# Patient Record
Sex: Male | Born: 1970 | Race: White | Hispanic: No | Marital: Married | Smoking: Never smoker
Health system: Southern US, Community
[De-identification: ages and names within clinical notes are randomized; demographics above are authoritative.]

## PROBLEM LIST (undated history)

## (undated) DIAGNOSIS — Z9289 Personal history of other medical treatment: Secondary | ICD-10-CM

## (undated) DIAGNOSIS — I471 Supraventricular tachycardia, unspecified: Secondary | ICD-10-CM

## (undated) DIAGNOSIS — K219 Gastro-esophageal reflux disease without esophagitis: Secondary | ICD-10-CM

## (undated) DIAGNOSIS — L709 Acne, unspecified: Secondary | ICD-10-CM

## (undated) DIAGNOSIS — J302 Other seasonal allergic rhinitis: Secondary | ICD-10-CM

## (undated) DIAGNOSIS — F419 Anxiety disorder, unspecified: Secondary | ICD-10-CM

## (undated) DIAGNOSIS — N200 Calculus of kidney: Secondary | ICD-10-CM

## (undated) DIAGNOSIS — K589 Irritable bowel syndrome without diarrhea: Secondary | ICD-10-CM

## (undated) HISTORY — DX: Anxiety disorder, unspecified: F41.9

## (undated) HISTORY — DX: Acne, unspecified: L70.9

## (undated) HISTORY — DX: Personal history of other medical treatment: Z92.89

## (undated) HISTORY — DX: Calculus of kidney: N20.0

## (undated) HISTORY — DX: Supraventricular tachycardia, unspecified: I47.10

## (undated) HISTORY — PX: MOUTH SURGERY: SHX715

## (undated) HISTORY — DX: Irritable bowel syndrome, unspecified: K58.9

## (undated) HISTORY — DX: Supraventricular tachycardia: I47.1

## (undated) HISTORY — DX: Other seasonal allergic rhinitis: J30.2

## (undated) HISTORY — PX: TOENAIL EXCISION: SUR558

## (undated) HISTORY — DX: Gastro-esophageal reflux disease without esophagitis: K21.9

---

## 1999-07-05 ENCOUNTER — Encounter: Payer: Self-pay | Admitting: Emergency Medicine

## 1999-07-05 ENCOUNTER — Emergency Department (HOSPITAL_COMMUNITY): Admission: EM | Admit: 1999-07-05 | Discharge: 1999-07-05 | Payer: Self-pay | Admitting: Emergency Medicine

## 2007-06-01 ENCOUNTER — Ambulatory Visit: Payer: Self-pay | Admitting: Family Medicine

## 2007-06-04 ENCOUNTER — Observation Stay (HOSPITAL_COMMUNITY): Admission: EM | Admit: 2007-06-04 | Discharge: 2007-06-05 | Payer: Self-pay | Admitting: Emergency Medicine

## 2007-08-24 ENCOUNTER — Ambulatory Visit: Payer: Self-pay | Admitting: Internal Medicine

## 2007-11-10 ENCOUNTER — Ambulatory Visit: Payer: Self-pay | Admitting: Family Medicine

## 2007-12-20 ENCOUNTER — Ambulatory Visit: Payer: Self-pay | Admitting: Family Medicine

## 2008-07-25 ENCOUNTER — Ambulatory Visit: Payer: Self-pay | Admitting: Family Medicine

## 2008-08-09 ENCOUNTER — Ambulatory Visit: Payer: Self-pay | Admitting: Internal Medicine

## 2008-09-05 DIAGNOSIS — K219 Gastro-esophageal reflux disease without esophagitis: Secondary | ICD-10-CM | POA: Insufficient documentation

## 2008-09-05 DIAGNOSIS — F411 Generalized anxiety disorder: Secondary | ICD-10-CM | POA: Insufficient documentation

## 2008-09-05 DIAGNOSIS — I471 Supraventricular tachycardia: Secondary | ICD-10-CM | POA: Insufficient documentation

## 2008-12-24 IMAGING — CR DG CHEST 2V
2 series · 2 of 2 positions shown · non-contrast
Comparison: none

CLINICAL DATA: Tachycardia.
 LDHK1-5 VIEWS:

[w chest pa]
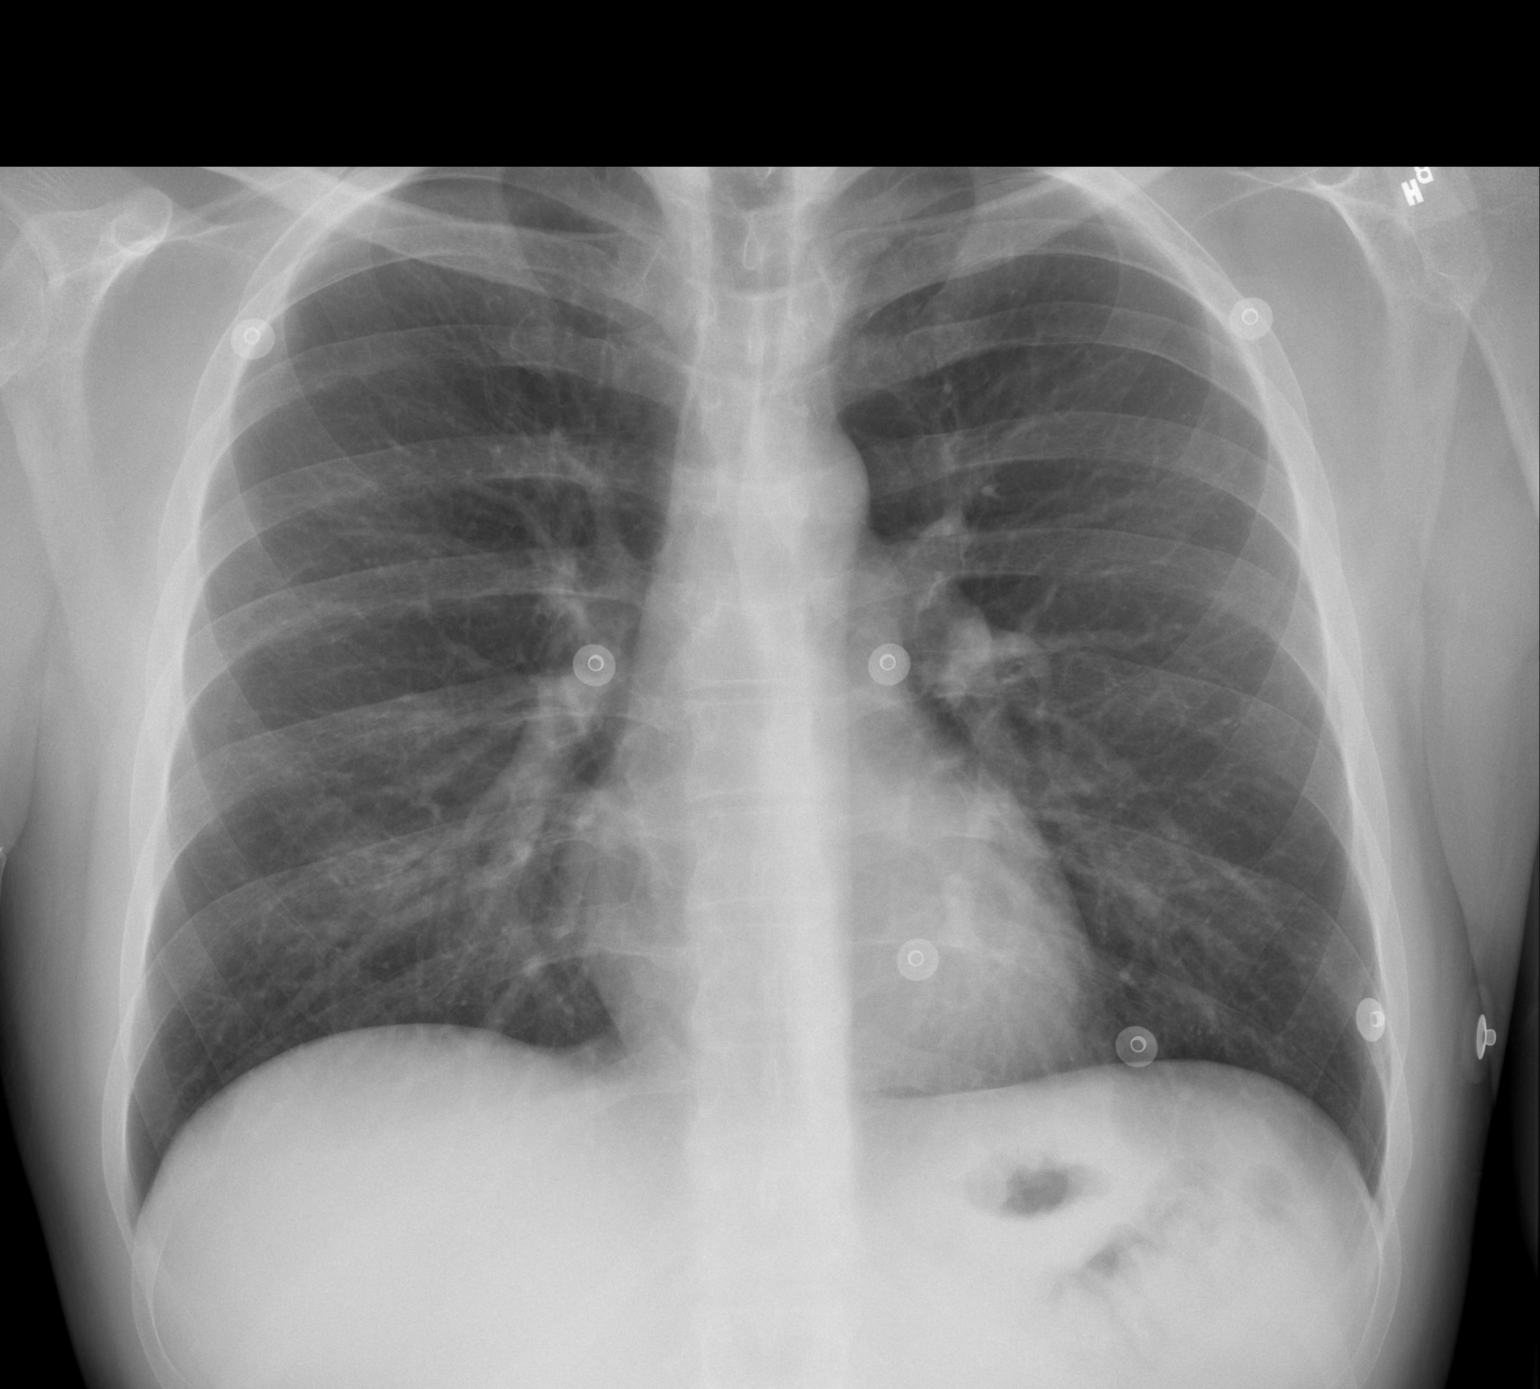

[w chest lat]
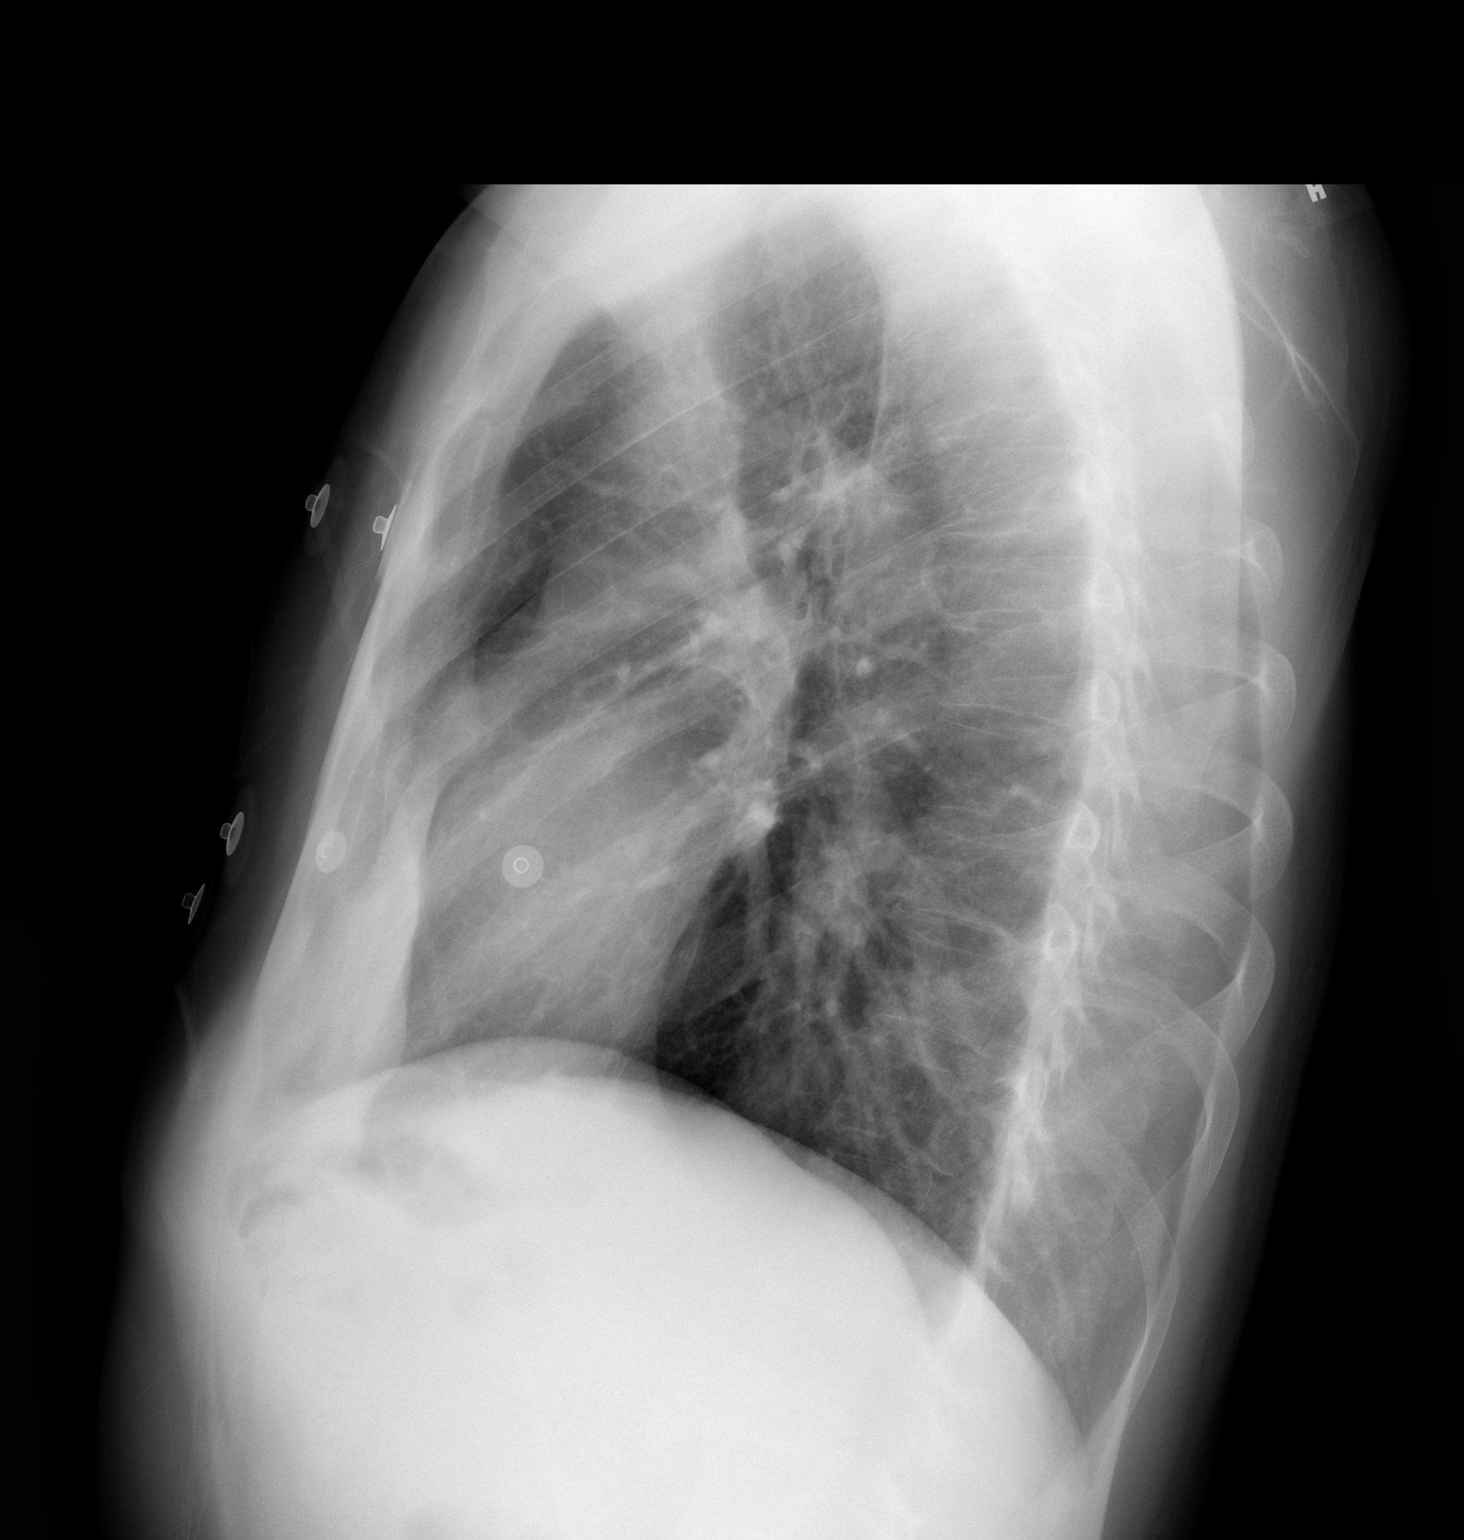

[2 of 2 positions shown; findings below may reference images not displayed]

FINDINGS: The cardiomediastinal contours are normal.  The lungs are clear.  There is no pleural effusion or pneumothorax.  No acute osseous findings are seen.
IMPRESSION: No active cardiopulmonary process.

## 2009-11-08 ENCOUNTER — Ambulatory Visit: Payer: Self-pay | Admitting: Internal Medicine

## 2009-11-08 DIAGNOSIS — R030 Elevated blood-pressure reading, without diagnosis of hypertension: Secondary | ICD-10-CM | POA: Insufficient documentation

## 2010-08-26 NOTE — Assessment & Plan Note (Signed)
Summary: f1y/per pt call/lg  Medications Added MULTIVITAMINS  TABS (MULTIPLE VITAMIN) once daily OMEGA-3 1000 MG CAPS (OMEGA-3 FATTY ACIDS) one capsule daily ZANTAC 150 MAXIMUM STRENGTH 150 MG TABS (RANITIDINE HCL) 1/2 tab as needed      Allergies Added: NKDA  History of Present Illness: Mr. Jordan Stafford is seen in followup for SVT.  He elected to undertake medical therapy and he has been on metoprolol tartrate 25 mg twice daily for long time without clinical recurrences.  Current Medications (verified): 1)  Metoprolol Tartrate 25 Mg Tabs (Metoprolol Tartrate) .... One Tablet By Mouth Two Times A Day 2)  Multivitamins  Tabs (Multiple Vitamin) .... Once Daily 3)  Omega-3 1000 Mg Caps (Omega-3 Fatty Acids) .... One Capsule Daily 4)  Zantac 150 Maximum Strength 150 Mg Tabs (Ranitidine Hcl) .... 1/2 Tab As Needed  Allergies (verified): No Known Drug Allergies  Past History:  Past Medical History: Last updated: 09/05/2008 supra-ventricular tachycardia Anxiety GERD Irritable bowel syndrome Allergies  Past Surgical History: Last updated: 09/05/2008 oral surgery x3  Family History: Last updated: 09/05/2008 Negative FH of Diabetes, Hypertension, or Coronary Artery Disease  Social History: Last updated: 09/05/2008 Single  Tobacco Use - No.  Alcohol Use - no  Vital Signs:  Patient profile:   40 year old male Height:      68.5 inches Weight:      162 pounds BMI:     24.36 Pulse rate:   71 / minute Pulse rhythm:   regular BP sitting:   130 / 90  (left arm) Cuff size:   regular  Vitals Entered By: Judithe Modest CMA (November 08, 2009 11:12 AM)  Physical Exam  General:  The patient was alert and oriented in no acute distress. HEENT Normal.  Neck veins were flat, carotids were brisk.  Lungs were clear.  Heart sounds were regular without murmurs or gallops.  Abdomen was soft with active bowel sounds. There is no clubbing cyanosis or edema. Skin Warm and  dry    Impression & Recommendations:  Problem # 1:  SVT/ PSVT/ PAT (ICD-427.0) no recurrences His updated medication list for this problem includes:    Metoprolol Tartrate 25 Mg Tabs (Metoprolol tartrate) ..... One tablet by mouth two times a day  Orders: EKG w/ Interpretation (93000)  Problem # 2:  ELEVATED BP READING WITHOUT DX HYPERTENSION (ICD-796.2) He will track his blood pressure over time to  Patient Instructions: 1)  Your physician wants you to follow-up in:  12 months with Dr Graciela Husbands. You will receive a reminder letter in the mail two months in advance. If you don't receive a letter, please call our office to schedule the follow-up appointment. Prescriptions: METOPROLOL TARTRATE 25 MG TABS (METOPROLOL TARTRATE) one tablet by mouth two times a day  #60 x 11   Entered by:   Optometrist BSN   Authorized by:   Nathen May, MD, [facility]   Signed by:   Gypsy Balsam RN BSN on 11/08/2009   Method used:   Tax adviser to        [employer] (retail)       [phone]       [place], [place]  [address]       Ph: [phone]       Fax: [phone]   RxID:   [id number]

## 2010-11-07 ENCOUNTER — Telehealth: Payer: Self-pay | Admitting: Internal Medicine

## 2010-11-07 DIAGNOSIS — I471 Supraventricular tachycardia: Secondary | ICD-10-CM

## 2010-11-07 MED ORDER — METOPROLOL TARTRATE 25 MG PO TABS
25.0000 mg | ORAL_TABLET | Freq: Two times a day (BID) | ORAL | Status: DC
Start: 2010-11-07 — End: 2010-12-08

## 2010-11-07 NOTE — Telephone Encounter (Signed)
LMTCB ./CY 

## 2010-11-07 NOTE — Telephone Encounter (Signed)
Pt has question re meds and Pt [phone].

## 2010-11-07 NOTE — Telephone Encounter (Addendum)
SPOKE WITH PT HAD TAKEN LOPRESSOR LAST NIGHT AROUND 10:00 PM  WHICH IS LATER THAN NORMAL  .GOT UP THIS AM AND TOOK  LOPRESSOR AT 7:00 AM  C/O FEELING A LITTLE LIGHT HEADED  FEELS BETTER AT THIS TIME INSTRUCTED TO MONITOR B/P   AND  MAY TAKE SECOND DOSE AT 700 PM THIS EVENING IF FEELS OKAY.VERBALIZED UNDERSTANDING./CY

## 2010-12-04 ENCOUNTER — Encounter: Payer: Self-pay | Admitting: Internal Medicine

## 2010-12-08 ENCOUNTER — Encounter: Payer: Self-pay | Admitting: Internal Medicine

## 2010-12-08 ENCOUNTER — Ambulatory Visit (INDEPENDENT_AMBULATORY_CARE_PROVIDER_SITE_OTHER): Payer: BC Managed Care – PPO | Admitting: Internal Medicine

## 2010-12-08 VITALS — BP 127/84 | HR 81 | Ht 68.0 in | Wt <= 1120 oz

## 2010-12-08 DIAGNOSIS — I471 Supraventricular tachycardia: Secondary | ICD-10-CM

## 2010-12-08 MED ORDER — DILTIAZEM HCL 60 MG PO TABS: 60.0000 mg | ORAL_TABLET | Freq: Two times a day (BID) | ORAL | Status: DC

## 2010-12-08 NOTE — Progress Notes (Signed)
  HPI  Jordan Stafford is a 40 y.o. male   Past Medical History  Diagnosis Date  . Tachycardia     supra-ventricular  . Anxiety   . GERD (gastroesophageal reflux disease)   . IBS (irritable bowel syndrome)   . Seasonal allergies     Past Surgical History  Procedure Date  . Mouth surgery     x3    Current Outpatient Prescriptions  Medication Sig Dispense Refill  . ibuprofen (ADVIL,MOTRIN) 200 MG tablet Take 200 mg by mouth every 6 (six) hours as needed.        . metoprolol tartrate (LOPRESSOR) 25 MG tablet Take 1 tablet (25 mg total) by mouth 2 (two) times daily.  60 tablet  11  . Multiple Vitamin (MULTIVITAMINS PO) Take by mouth daily.        . OMEGA 3 1000 MG CAPS Take by mouth daily.        . ranitidine (ZANTAC 150 MAXIMUM STRENGTH) 150 MG tablet 1/2 tab as needed         No Known Allergies  Review of Systems negative except from HPI and PMH  Physical Exam Well developed and well nourished in no acute distress HENT normal E scleral and icterus clear Neck Supple JVP flat; carotids brisk and full Clear to ausculation Regular rate and rhythm, no murmurs gallops or rub Soft with active bowel sounds No clubbing cyanosis and edema Alert and oriented, grossly normal motor and sensory function Skin Warm and Dry  ECG sinus rhythm at 74 Intervals 0.16/2009/0.36 Axis LXXXII  Assessment and  Plan

## 2010-12-08 NOTE — Patient Instructions (Addendum)
Your physician has recommended you make the following change in your medication: 1) Stop metoprolol. 2) Take Diltiazem 60mg  one tablet twice daily.  Your physician wants you to follow-up in: 1 year. You will receive a reminder letter in the mail two months in advance. If you don't receive a letter, please call our office to schedule the follow-up appointment.

## 2010-12-08 NOTE — Assessment & Plan Note (Addendum)
The patient has had no recurrences of tachycardia. He is exercising 6 days a week. He is concerned about weight gain. His max heart rate or heart rate shortly after he is off the treadmill his only 105. We have discussed alternative therapies including stopping beta blockers or trying a calcium blocker. We will try diltiazem 60 mg twice daily. We'll see how he does.   He is further concerned about the epinephrine be withheld from his nose he forgot work. I assured him that it does not augment the anesthetic effect to the best of my knowledge.

## 2010-12-09 NOTE — Discharge Summary (Signed)
NAME:  Jordan Stafford, Jordan Stafford NO.:  [id number]   MEDICAL RECORD NO.:  [id number]          PATIENT TYPE:  OBV   LOCATION:  3731                         FACILITY:  [facility]   PHYSICIAN:  Dani Gobble, MD       DATE OF BIRTH:  10/31/1970   DATE OF ADMISSION:  06/03/2007  DATE OF DISCHARGE:  06/04/2006                               DISCHARGE SUMMARY   ADDENDUM:   DISCHARGE DIAGNOSES:  Please note hypokalemia, again replaced.   PLAN:  The patient was to be discharged June 07, 2007, but developed  the feeling in his stomach as if he was going to go back into the  tachycardia.  We kept him overnight just for observation.  All of his  new studies were negative.  Amylase and lipase were negative.  On the  morning prior to discharge, LDL 97, HDL 34, triglycerides 188 and  cholesterol 169.  His potassium was low at 3.3 and we gave him 40 mEq of  potassium.  Dr. Allyson Sabal does want him in addition to a 2D echocardiogram  to get a stress Myoview in the office tomorrow morning so he knows he  knows to be n.p.o. after midnight and not to take his Lopressor until  after the stress test.  He has not had any today, June 05, 2007.   He will then followup with Dr. Clarene Duke as previously instructed after the  2D echocardiogram is done as well. He did well last night.  Dr. Allyson Sabal  saw him on the day of discharge.  The plan will be depending on these  test results, etc., he may need EP evaluation.      Darcella Gasman. Annie Paras, N.P.    ______________________________  Dani Gobble, MD    LRI/MEDQ  D:  06/05/2007  T:  06/05/2007  Job:  [id number]

## 2010-12-09 NOTE — Assessment & Plan Note (Signed)
[facility]                         ELECTROPHYSIOLOGY OFFICE NOTE   Glenden, Rossell SULIMAN TERMINI                        MRN:          [id number]  DATE:08/09/2008                            DOB:          11-30-1970    Mr. Jordan Stafford is seen in followup for SVT.  He elected to undertake medical  therapy and he has been on metoprolol tartrate 25 mg twice daily for  long time without clinical recurrences.   He comes in describing an episode of a fullness in his stomach that is  associated with transient lightheadedness.  His girlfriend who is a  nurse is taking his pulse through this and his pulse has been normal.  These episodes last up to about 60 seconds.  He gets warm and sweaty  with these.   PHYSICAL EXAMINATION:  VITAL SIGNS:  His blood pressure is 120/80, his  pulse was 81.  LUNGS:  Clear.  HEART:  Sounds were regular.  ABDOMEN:  Soft.  EXTREMITIES:  Without edema.   Electrocardiogram dated today demonstrated sinus rhythm at 81 with  intervals of 0.15/0.10/0.36, the axis was 65 degrees.   IMPRESSION:  1. Supraventricular tachycardia, on metoprolol tartrate.  2. Episode of abdominal fullness, question cause without evidence of a      primary arrhythmia.   I have asked him to follow up with Dr. Susann Givens about this, we will plan  to see him again in one year's time.     Jordan Salvia, MD, [facility]  Electronically Signed    SCK/MedQ  DD: 08/09/2008  DT: 08/10/2008  Job #: [id number]   cc:   Jordan Stafford, M.D.  Jordan Stafford, M.D.

## 2010-12-09 NOTE — Letter (Signed)
August 24, 2007    Jordan Stafford, M.D.  [address]  [address]  [place], [place] [address]   RE:  Jordan Stafford, Jordan Stafford  MRN:  [id number]  /  DOB:  06-Aug-1970   Dear Jordan Stafford:   It was a pleasure to see to the pleasure to see Jordan Stafford at your  request because of supraventricular tachycardia.   You saw him apparently in 2004 where he had an episode of feeling like  his heart block flopped down into his stomach and back up into his neck.  There was some post event hypotension.  There was interestingly no  tachycardia with this and he recalls that when he took his blood  pressure at home, he did not recall a tachycardia.   He has had episodes of this 3-4 times a year, each of which has been  very brief until the episode in November when he went to the hospital  with a documented tachycardia.  He had measured his blood pressure  again, it was low, his heart rate was recorded at 190 and he tried to  get himself the hospital.  These episodes were diuretic negative, fraud  negative. There was no shortness breath or chest discomfort. There was a  Stafford bit of lightheadedness with it.   He was given adenosine with termination.   CARDIAC EVALUATION:  I do not recall that I saw an echo report.   SOCIAL HISTORY:  He is single.  He lives at home with his dog.  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   His outpatient medications include only Prilosec and metoprolol 25  b.i.d.   He has no known drug allergies.   REVIEW OF SYSTEMS:  In addition to the above was notable for allergies,  fatigue, IBS, GE reflux as well as anxiety.   PHYSICAL EXAMINATION:  He is a young Caucasian male appearing his stated  age of 2. I will get what was recorded later. His heart rate was as 75.  History blood pressure was 133/86, his pulse was 81.  HEENT:  Demonstrated no icterus or xanthoma.  NECK:  Veins were flat. The carotids were brisk and full bilaterally  without bruits.  BACK:  Without kyphosis or scoliosis.  LUNGS:   Clear.  HEART:  Sounds were regular without murmurs or gallops.  ABDOMEN:  Soft with active bowel sounds and without midline pulsation or  hepatomegaly.  Femoral pulses were 2+, distal pulses were intact.  There  was no clubbing, cyanosis or edema.  NEUROLOGIC:  Grossly normal.  SKIN:  Warm and dry.   PAST SURGICAL HISTORY:  Notable for oral surgery.   There is documentation of a tachycardia dated June 03, 2007. There  was a narrow QRS tachycardia with a cycle length of approximately 300  milliseconds.  I could not see a P-wave as it is a very bad copy from  EMS.   A 12-lead dated the same day demonstrated sinus rhythm at 100 with  intervals of 0.15/0.10/0.33. The axis was 80 degrees.  There was minor T-  wave flattening.   IMPRESSION:  1. Supraventricular tachycardia.  2. Anxiety.   Mr. Jordan Stafford has super SVT.  He was not interested after a lengthy  discussion with him and his father to pursue catheter ablation.  He  would like to continue on his medications currently.   We discussed the potential benefits as well as the potential risks. We  discussed the potential issues related to the location  of the pathway  vis-a-vis the AV node and the risk of heart block.  The father expressed  his frustrations about the surgery with his mother when he told the  surgeon's not to take veins from one of the patient's leg and they did  so anyway.   I will see the patient again in 6 months' time.   Thank you for the consultation.    Sincerely,      Jordan Salvia, MD, [facility]  Electronically Signed    SCK/MedQ  DD: 08/24/2007  DT: 08/25/2007  Job #: [id number]   CC:   Jordan Stafford, M.D.  Jordan Stafford, M.D.

## 2010-12-09 NOTE — Discharge Summary (Signed)
NAME:  Jordan Stafford, Jordan Stafford NO.:  [id number]   MEDICAL RECORD NO.:  [id number]          PATIENT TYPE:  OBV   LOCATION:  3731                         FACILITY:  [facility]   PHYSICIAN:  Dani Gobble, MD       DATE OF BIRTH:  1970/12/20   DATE OF ADMISSION:  06/03/2007  DATE OF DISCHARGE:  06/04/2007                               DISCHARGE SUMMARY   This is a 23-hour observation.   DISCHARGE DIAGNOSES:  1. Paroxysmal supraventricular tachycardia syndrome resolved.  2. Anxiety.  3. Gastroesophageal reflux disease.   DISCHARGE CONDITION:  Improved.   DISCHARGE MEDICATIONS:  1. Prilosec 20 mg daily.  2. Doxycycline daily as before.  3. Lopressor 50 mg a half a tablet twice a day which was a new      medication.   DISCHARGE INSTRUCTIONS:  1. Activity without restrictions, low-sodium heart healthy diet.  2. Call if any problems develop.  3. Follow up with Dr. Clarene Duke.  Office will call with date and time.   HISTORY OF PRESENT ILLNESS:  A 41 year old white male with a history of  gastroesophageal reflux disease presenting to [facility] by EMS with SVT.  The  symptoms began approximately 3 years ago with sensation of fullness in  epigastric region radiating into his chest and neck.  Sudden onset,  usually occurs with exercise and during or just after meals.  He  attributed his symptoms to reflux and using p.r.n. Zantac.  Episodes  were short-lived and infrequent, but more episodes are occurring now  more frequently and increasingly severe.  He came in actually to the ER  June 03, 2007, and he had sat up from his computer and experienced  the epigastric discomfort and noted a feeling of nervousness.  He had  mild lightheadedness, no chest pain, heart rate was up, and he was  unable to get a deep breath.  EMS was consulted and found to have  patient to be in a near complex tachycardia.  Heart rate was 228.  He  was given 6 mg of IV adenosine and restored to sinus rhythm.   He  was admitted for observation.   PAST MEDICAL HISTORY:  Reflux disease.   FAMILY HISTORY:  Maternal grandmother had coronary disease.   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   ALLERGIES:  NO KNOWN DRUG ALLERGIES.   OUTPATIENT MEDICATIONS:  Prilosec 2 daily.   REVIEW OF SYSTEMS:  See H&P.   PHYSICAL EXAM AT DISCHARGE:  VITAL SIGNS:  Blood pressure 116/77, pulse  86, respiratory rate is 18, temperature 98.5, oxygen saturation 97% room  air.  HEART:  Regular rate and rhythm, slightly tachy.  LUNGS:  Clear.   LABORATORY DATA:  Hemoglobin 15, hematocrit 42, platelets 236, WBC 8.4.  Sodium 137, potassium 3.5, BUN 10, creatinine 0.88, glucose 117.  He was  given 20 of potassium p.o. prior to discharge.  Magnesium level was 1.9.  The cardiac enzymes were all negative.  CK is 77, MB 1.3, troponin 0.02,  and this remained the same.  TSH was normal at 2.593.  Drug screen was  negative.  LFTs were normal as well.   HOSPITAL COURSE:  Jordan Stafford was admitted for observation after  presenting with PSVT.  He was given 6 mg IV adenosine, converted to  sinus, and without followup episodes.  Dr. Allyson Sabal saw him, and on the day  of discharge felt that he did need beta blocker.  We will go ahead and  do a beta blocker.  He will also be scheduled to see Dr. Clarene Duke as a  followup at his request.  Dr. Clarene Duke takes care of the patient's mother  and grandmother.  We will do a 2-D echocardiogram as an outpatient.  Additionally, he will probably need an EP evaluation, but we will leave  that to Dr. Clarene Duke to arrange.      Darcella Gasman. Annie Paras, N.P.    ______________________________  Dani Gobble, MD    LRI/MEDQ  D:  06/04/2007  T:  06/04/2007  Job:  [id number]   cc:   Thereasa Solo. Little, M.D.  Sharlot Gowda, M.D.

## 2011-05-05 LAB — URINALYSIS, ROUTINE W REFLEX MICROSCOPIC
Bilirubin Urine: NEGATIVE
Glucose, UA: NEGATIVE
Hgb urine dipstick: NEGATIVE
Ketones, ur: NEGATIVE
pH: 6.5

## 2011-05-05 LAB — POCT CARDIAC MARKERS
CKMB, poc: <1 — ABNORMAL LOW
CKMB, poc: <1 — ABNORMAL LOW
CKMB, poc: <1 — ABNORMAL LOW
Myoglobin, poc: 154
Troponin i, poc: <0.05
Troponin i, poc: <0.05

## 2011-05-05 LAB — DIFFERENTIAL
Eosinophils Absolute: 0.1
Eosinophils Relative: 2
Lymphs Abs: 1.6
Monocytes Relative: 8

## 2011-05-05 LAB — COMPREHENSIVE METABOLIC PANEL
ALT: 25
AST: 31
CO2: 26
Calcium: 9
GFR calc Af Amer: >60
Potassium: 3.5
Sodium: 139
Total Protein: 6.6

## 2011-05-05 LAB — LIPID PANEL
Cholesterol: 169
LDL Cholesterol: 97
VLDL: 38

## 2011-05-05 LAB — CK TOTAL AND CKMB (NOT AT ARMC)
CK, MB: 1
CK, MB: 1.3
Relative Index: INVALID
Total CK: 64
Total CK: 77

## 2011-05-05 LAB — CBC
MCHC: 35.7
RBC: 4.75
RDW: 11.9

## 2011-05-05 LAB — BASIC METABOLIC PANEL
BUN: 10
CO2: 27
Calcium: 9
Calcium: 9.4
Creatinine, Ser: 0.88
Creatinine, Ser: 0.91
GFR calc Af Amer: >60
GFR calc non Af Amer: >60
Glucose, Bld: 117 — ABNORMAL HIGH
Sodium: 141

## 2011-05-05 LAB — TRICYCLICS SCREEN, URINE: TCA Scrn: NOT DETECTED

## 2011-05-05 LAB — RAPID URINE DRUG SCREEN, HOSP PERFORMED: Cocaine: NOT DETECTED

## 2011-05-05 LAB — TROPONIN I: Troponin I: 0.02

## 2011-07-01 ENCOUNTER — Encounter: Payer: Self-pay | Admitting: Internal Medicine

## 2011-07-02 ENCOUNTER — Encounter: Payer: Self-pay | Admitting: Family Medicine

## 2011-07-02 ENCOUNTER — Ambulatory Visit (INDEPENDENT_AMBULATORY_CARE_PROVIDER_SITE_OTHER): Payer: BC Managed Care – PPO | Admitting: Family Medicine

## 2011-07-02 VITALS — BP 130/90 | HR 80 | Temp 99.0°F | Ht 68.0 in | Wt 168.0 lb

## 2011-07-02 DIAGNOSIS — J329 Chronic sinusitis, unspecified: Secondary | ICD-10-CM

## 2011-07-02 MED ORDER — AMOXICILLIN-POT CLAVULANATE 875-125 MG PO TABS: 1.0000 | ORAL_TABLET | Freq: Two times a day (BID) | ORAL | Status: DC

## 2011-07-02 NOTE — Progress Notes (Signed)
Chief complaint:  cough x 2 weeks. Seen at Urgent Care twice. Still has cough, coughed so much 2 nights ago that he vomited. Stuff head, HA.Yellow mucous  HPI:  Started with sore throat the Sunday before Thanksgiving.  Throat improved a day or so later, then started coughing.  Went to [facility] the Saturday after Thanksgiving, was told it was viral and rx'd a cough syrup with codeine, and recommended he take Mucinex.  Wasn't having fevers then.  Started to get better, but then got worse.  Having a lot of PND, causing coughing fits and post-tussive emesis.  He was seen again at [facility] 2 nights ago, and rx'd Tussionex and a Symbicort. He didn't use the Symbicort because he wasn't having shortness of breath, and was concerned about increasing heart rate with his h/o SVT.  Having a frontal headache today, but otherwise denies significant sinus pain.  Still having ongoing postnasal drainage, although a little better than 2 nights ago.  Still having a lot of nasal congestion and ear plugging/popping.  Denies ear pain.  Denies fevers, wasn't aware of lowgrade fever he is running now.  Past Medical History  Diagnosis Date  . Tachycardia     supra-ventricular  . Anxiety   . GERD (gastroesophageal reflux disease)   . IBS (irritable bowel syndrome)   . Seasonal allergies   . Renal stone   . PSVT (paroxysmal supraventricular tachycardia)     Past Surgical History  Procedure Date  . Mouth surgery     x3    History   Social History  . Marital Status: Single    Spouse Name: N/A    Number of Children: N/A  . Years of Education: N/A   Occupational History  . Not on file.   Social History Main Topics  . Smoking status: Never Smoker   . Smokeless tobacco: Never Used  . Alcohol Use: No  . Drug Use: No  . Sexually Active: Not on file   Other Topics Concern  . Not on file   Social History Narrative   Engaged    Family History  Problem Relation Age of Onset  . Coronary artery disease  Neg Hx   . Hypertension Neg Hx   . Evelene Croon Parkinson White syndrome Mother   . Diabetes Father     borderline   Current Outpatient Prescriptions on File Prior to Visit  Medication Sig Dispense Refill  . diltiazem (CARDIZEM) 60 MG tablet Take 1 tablet (60 mg total) by mouth 2 (two) times daily.  60 tablet  11  . ibuprofen (ADVIL,MOTRIN) 200 MG tablet Take 200 mg by mouth every 6 (six) hours as needed.        . Multiple Vitamin (MULTIVITAMINS PO) Take by mouth daily.        . OMEGA 3 1000 MG CAPS Take by mouth daily.        . ranitidine (ZANTAC 150 MAXIMUM STRENGTH) 150 MG tablet 1/2 tab as needed        No Known Allergies  ROS:  Denies chest pain, shortness of breath, rash, nausea, diarrhea.  +post-tussive emesis.  See HPI  PHYSICAL EXAM: BP 130/90  Pulse 80  Temp(Src) 99 F (37.2 C) (Oral)  Ht 5\' 8"  (1.727 m)  Wt 168 lb (76.204 kg)  BMI 25.54 kg/m2 Well developed, pleasant male in no distress, accompanied by his fiancee HEENT:  PERRL, EOMI, conjunctiva clear.  Nasal mucosa with mod edema with thick yellow crusting bilaterally.  Mild  tenderness at frontal sinuses.  Erythema posteriorly from PND in throat Neck: no lymphadenopathy or thyromegaly Lungs clear bilaterally Skin: no rash  ASSESSMENT/PLAN: 1. Sinusitis  amoxicillin-clavulanate (AUGMENTIN) 875-125 MG per tablet   Supportive measures reviewed.  Call for refill if symptoms not completely resolved after 10 days.  Call sooner if symptoms worsening despite meds

## 2011-07-02 NOTE — Patient Instructions (Signed)
Continue Mucinex, drinking plenty of fluids.  Take all antibiotics as directed.  You may use probiotics if needed for diarrhea (common side effect from this antibiotic)  If symptoms are partially better, but not completely better by day 10 (last day of medication), then call for additional week; call sooner if getting worse despite the antibiotics

## 2011-07-09 ENCOUNTER — Telehealth: Payer: Self-pay | Admitting: Internal Medicine

## 2011-07-10 ENCOUNTER — Telehealth: Payer: Self-pay | Admitting: Family Medicine

## 2011-07-10 DIAGNOSIS — J329 Chronic sinusitis, unspecified: Secondary | ICD-10-CM

## 2011-07-10 MED ORDER — AMOXICILLIN-POT CLAVULANATE 875-125 MG PO TABS: 1.0000 | ORAL_TABLET | Freq: Two times a day (BID) | ORAL | Status: AC

## 2011-07-10 NOTE — Telephone Encounter (Signed)
Refilled at [facility]   Augmentin

## 2011-07-10 NOTE — Telephone Encounter (Signed)
Pt called this morning wanting to know about his second round of antibiotics that he req yesterday.  I advised Dr. Lynelle Doctor not here, but I will contact her to see.  [facility] [employer].        Pt# [phone] ok to advise fiance.

## 2011-07-10 NOTE — Telephone Encounter (Signed)
Reviewed chart Dr. Lynelle Doctor noted will give refill if needed.  Pt has requested refill.  Called [facility] [phone].  Called pt [phone] t/w fiance advised refill there.  Pt advised I could speak with Fiance.

## 2011-07-10 NOTE — Telephone Encounter (Signed)
He was rx'd 10 days of Augmentin on 12/6--he shouldn't be out yet (which is why I didn't get to message yesterday).  Okay to take additional Augmentin if symptoms haven't completely resolved by the time he takes the 10th day of meds.  Ensure he is taking meds appropriately.  He likely will only need an additional week of meds, and not another full 10 days, so he can stop them at a week if he feels 100% better.

## 2011-07-16 NOTE — Telephone Encounter (Signed)
Was this done [place]?

## 2011-07-17 ENCOUNTER — Telehealth: Payer: Self-pay | Admitting: Family Medicine

## 2011-07-17 ENCOUNTER — Ambulatory Visit (INDEPENDENT_AMBULATORY_CARE_PROVIDER_SITE_OTHER): Payer: BC Managed Care – PPO | Admitting: Family Medicine

## 2011-07-17 VITALS — BP 116/79 | HR 90 | Wt 168.0 lb

## 2011-07-17 DIAGNOSIS — J209 Acute bronchitis, unspecified: Secondary | ICD-10-CM

## 2011-07-17 DIAGNOSIS — G479 Sleep disorder, unspecified: Secondary | ICD-10-CM

## 2011-07-17 MED ORDER — LEVOFLOXACIN 500 MG PO TABS: 500.0000 mg | ORAL_TABLET | Freq: Every day | ORAL | Status: DC

## 2011-07-17 NOTE — Telephone Encounter (Signed)
He needs a followup appointment whenever it is convenient

## 2011-07-17 NOTE — Progress Notes (Signed)
  Subjective:    Patient ID: Jordan Stafford, male    DOB: 02/18/1971, 40 y.o.   MRN: [id number]  HPI He is here for a recheck. He is roughly 75% better. A second round of Augmentin and apparently has not had much of an effect. He still coughs but it is now intermittent. No fever, chills, sore throat or earache. He also has been having some sleep disturbance mainly due to his work. 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 This is interfering with sleeping at night.   Review of Systems     Objective:   Physical Exam alert and in no distress. Tympanic membranes and canals are normal. Throat is clear. Tonsils are normal. Neck is supple without adenopathy or thyromegaly. Cardiac exam shows a regular sinus rhythm without murmurs or gallops. Lungs are clear to auscultation.        Assessment & Plan:   1. Bronchitis, acute   2. Sleep disturbance    I will switch to Levaquin. He is to call me at the end of the dosing to let me know Jordan Stafford is doing. We also discussed sleep hygiene. He will try to get his schedule rearranged.

## 2011-07-17 NOTE — Patient Instructions (Signed)
Take all the antibiotic and call me if you and if you're not totally back to normal

## 2011-07-17 NOTE — Telephone Encounter (Signed)
Phoned pt he is 1/2 way through second round and is still having intermittent coughing fits with some sinus drainage.  He will finish the 2nd round since he is not 100% better and when finished he will return for ov is not 100% better.

## 2011-07-17 NOTE — Telephone Encounter (Signed)
Pt making an appt

## 2011-07-18 ENCOUNTER — Telehealth: Payer: Self-pay | Admitting: Nurse Practitioner

## 2011-07-18 NOTE — Telephone Encounter (Signed)
Pt called stating that he was placed on levaquin today for an URI and wants to be sure that this is not going to exacerbate his SVT.  I advised that he should be fine to go ahead and take the levaquin.  He was grateful for the call back.

## 2011-07-24 ENCOUNTER — Telehealth: Payer: Self-pay | Admitting: Internal Medicine

## 2011-07-24 ENCOUNTER — Encounter: Payer: Self-pay | Admitting: Medical

## 2011-07-24 ENCOUNTER — Ambulatory Visit (INDEPENDENT_AMBULATORY_CARE_PROVIDER_SITE_OTHER): Payer: BC Managed Care – PPO | Admitting: Medical

## 2011-07-24 VITALS — BP 120/80 | HR 68 | Temp 98.6°F | Wt 167.0 lb

## 2011-07-24 DIAGNOSIS — R05 Cough: Secondary | ICD-10-CM | POA: Insufficient documentation

## 2011-07-24 DIAGNOSIS — R059 Cough, unspecified: Secondary | ICD-10-CM

## 2011-07-24 DIAGNOSIS — R053 Chronic cough: Secondary | ICD-10-CM

## 2011-07-24 MED ORDER — CICLESONIDE 50 MCG/ACT NA SUSP: 2.0000 | Freq: Every day | NASAL | Status: DC

## 2011-07-24 NOTE — Telephone Encounter (Signed)
addendum to previous call   ciclesonide - nasal spray 50 mcg. Is also added. Please advise

## 2011-07-24 NOTE — Patient Instructions (Signed)
Use your Zantac twice a day every day until the cough has resolved.  Begin Zyrtec 10 mg over-the-counter at bedtime daily until cough resolves.  Begin nasal spray 1-2 sprays per nostril twice a day for one week.  You can use Tussionex twice daily if needed.  Call back in one week to let me know if cough has resolved.  I suspect your cough is from postnasal drip and allergen induced. Other things can cause cough including reflux.  If no improvement at all by Monday, call back and we can consider round of steroid.

## 2011-07-24 NOTE — Progress Notes (Signed)
Subjective:   HPI  Jordan Stafford is a 40 y.o. male who presents for recheck on cough. He was seen a week ago by Dr. Susann Givens for similar symptoms. At this point he has had a cough for the last month, cough is intermittent, he has been on 2 rounds of Augmentin and one round of Levaquin for the cough, and he notes improvement initially when on the antibiotic but then the cough continues. He has also been on 2 different cough syrups including codeine cough syrup and Tussionex which helped, but at this point he's been coughing so much that his whole chest hurts, he is sore, and he is felt a snap or pull his left chest from coughing.  He does have a history of GERD, takes Zantac periodically but not daily, and lately he has felt drainage down the back of his throat and a little bit of a runny nose.  No other aggravating or relieving factors.    No other c/o.  The following portions of the patient's history were reviewed and updated as appropriate: allergies, current medications, past family history, past medical history, past social history, past surgical history and problem list.  No Known Allergies  Current Outpatient Prescriptions on File Prior to Visit  Medication Sig Dispense Refill  . clindamycin (CLINDAGEL) 1 % gel Apply topically 2 (two) times daily.        Marland Kitchen diltiazem (CARDIZEM) 60 MG tablet Take 1 tablet (60 mg total) by mouth 2 (two) times daily.  60 tablet  11  . ibuprofen (ADVIL,MOTRIN) 200 MG tablet Take 200 mg by mouth every 6 (six) hours as needed.        Marland Kitchen levofloxacin (LEVAQUIN) 500 MG tablet Take 1 tablet (500 mg total) by mouth daily.  10 tablet  0  . Multiple Vitamin (MULTIVITAMINS PO) Take by mouth daily.        . OMEGA 3 1000 MG CAPS Take by mouth daily.        . ranitidine (RANITIDINE 75) 75 MG tablet Take 75 mg by mouth 2 (two) times daily.        . ranitidine (ZANTAC 150 MAXIMUM STRENGTH) 150 MG tablet 1/2 tab as needed       . tretinoin (RETIN-A) 0.025 % cream Apply  topically at bedtime.          Past Medical History  Diagnosis Date  . Tachycardia     supra-ventricular  . Anxiety   . GERD (gastroesophageal reflux disease)   . IBS (irritable bowel syndrome)   . Seasonal allergies   . Renal stone   . PSVT (paroxysmal supraventricular tachycardia)     Past Surgical History  Procedure Date  . Mouth surgery     x3    Family History  Problem Relation Age of Onset  . Coronary artery disease Neg Hx   . Hypertension Neg Hx   . Evelene Croon Parkinson White syndrome Mother   . Diabetes Father     borderline    History   Social History  . Marital Status: Single    Spouse Name: N/A    Number of Children: N/A  . Years of Education: N/A   Occupational History  . Not on file.   Social History Main Topics  . Smoking status: Never Smoker   . Smokeless tobacco: Never Used  . Alcohol Use: No  . Drug Use: No  . Sexually Active: Not on file   Other Topics Concern  . Not on file  Social History Narrative   Engaged   Review of Systems Constitutional: -fever, -chills, -sweats, -unexpected -weight change,-fatigue ENT: -runny nose, -ear pain, -sore throat, +post nasal drainage Cardiology:  -chest pain, -palpitations, -edema,+SORE CHEST Respiratory: +cough, chest hurts with deep breathing, -shortness of breath, -wheezing Gastroenterology: -abdominal pain, -nausea, -vomiting, -diarrhea, -constipation  Hematology: -bleeding or bruising problems Musculoskeletal: -arthralgias, -myalgias, -joint swelling, -back pain Ophthalmology: -vision changes Urology: -dysuria, -difficulty urinating, -hematuria, -urinary frequency, -urgency Neurology: -headache, -weakness, -tingling, -numbness    Objective:   Physical Exam  Filed Vitals:   07/24/11 1144  BP: 120/80  Pulse: 68  Temp: 98.6 F (37 C)    General appearance: alert, no distress, WD/WN, lean white male, pleasant, not ill appearing HEENT: normocephalic, sclerae anicteric, TMs pearly, right  nare with swollen turbinate, clear discharge, mild erythema, left nares patent with clear discharge, pharynx with post nasal drip Oral cavity: MMM, no lesions Neck: supple, no lymphadenopathy, no thyromegaly, no masses, no JVD Heart: RRR, normal S1, S2, no murmurs Lungs: CTA bilaterally, no wheezes, rhonchi, or rales Abdomen: +bs, soft, non tender, non distended, no masses, no hepatomegaly, no splenomegaly Pulses: 2+ symmetric, upper and lower extremities, normal cap refill Ext: no edema   CXR - no cardiomegaly, no acute changes, no obvious mass or pneumonia . No prior to compare.  Will send for radiology over read.   Assessment and Plan :    Encounter Diagnosis  Name Primary?  . Chronic cough Yes    Discussed possible etiologies of cough.  I suspect this cough is allergen mediated and from post nasal drip.  For now, advised he take his Zantac BID every day until cough resoles, begin OTC Zyrtec QHS daily until cough is resolved, Omnaris nasal spray sample, and he will call back in 1 week to update me on his symptoms.   CXR sent for overread.  Return sooner prn.

## 2011-07-24 NOTE — Telephone Encounter (Signed)
I spoke with the patient and advised that plain Zyrtec is ok for him as well as the nasal spray that was recommended (generic for omnaris).

## 2011-07-24 NOTE — Telephone Encounter (Signed)
New Msg: Pt calling stating that his PCP recommended pt take zyrtek 10 mg OTC for pt congestion and pt wanted to make sure this was approved per Dr. Graciela Husbands. Please return pt call to discuss further.

## 2011-07-25 LAB — CBC WITH DIFFERENTIAL/PLATELET
Basophils Relative: 0 % (ref 0–1)
HCT: 43.3 % (ref 39.0–52.0)
Hemoglobin: 14.9 g/dL (ref 13.0–17.0)
Lymphs Abs: 1.8 10*3/uL (ref 0.7–4.0)
MCH: 30.7 pg (ref 26.0–34.0)
MCHC: 34.4 g/dL (ref 30.0–36.0)
Monocytes Absolute: 0.8 10*3/uL (ref 0.1–1.0)
Monocytes Relative: 10 % (ref 3–12)
Neutro Abs: 5.6 10*3/uL (ref 1.7–7.7)
RBC: 4.86 MIL/uL (ref 4.22–5.81)

## 2011-08-18 ENCOUNTER — Telehealth: Payer: Self-pay | Admitting: Internal Medicine

## 2011-08-18 NOTE — Telephone Encounter (Signed)
New Problem:    Patient called wanting to try Sensa and was wondering if it would interfere with his medications. Please call back.

## 2011-08-18 NOTE — Telephone Encounter (Signed)
Weight loss drug. Will forward to Dr. Graciela Husbands for review and recommendations.

## 2011-08-19 NOTE — Telephone Encounter (Signed)
No idea SHOULD CHECK [facility]

## 2011-08-20 NOTE — Telephone Encounter (Signed)
I spoke with the patient and he is aware of Dr. Klein's recommendations. 

## 2011-09-22 ENCOUNTER — Other Ambulatory Visit: Payer: Self-pay

## 2011-09-22 ENCOUNTER — Ambulatory Visit (HOSPITAL_COMMUNITY)
Admission: RE | Admit: 2011-09-22 | Discharge: 2011-09-22 | Disposition: A | Discharge status: Home / Self Care | Payer: BC Managed Care – PPO | Source: Ambulatory Visit | Attending: Family Medicine | Admitting: Family Medicine

## 2011-09-22 DIAGNOSIS — T17908A Unspecified foreign body in respiratory tract, part unspecified causing other injury, initial encounter: Secondary | ICD-10-CM

## 2011-09-22 DIAGNOSIS — IMO0002 Reserved for concepts with insufficient information to code with codable children: Secondary | ICD-10-CM | POA: Insufficient documentation

## 2011-10-09 ENCOUNTER — Ambulatory Visit (INDEPENDENT_AMBULATORY_CARE_PROVIDER_SITE_OTHER): Payer: BC Managed Care – PPO | Admitting: Family Medicine

## 2011-10-09 ENCOUNTER — Encounter: Payer: Self-pay | Admitting: Family Medicine

## 2011-10-09 VITALS — BP 134/84 | HR 97 | Temp 98.9°F | Resp 14 | Ht 68.0 in | Wt 164.0 lb

## 2011-10-09 DIAGNOSIS — I471 Supraventricular tachycardia: Secondary | ICD-10-CM

## 2011-10-09 DIAGNOSIS — K219 Gastro-esophageal reflux disease without esophagitis: Secondary | ICD-10-CM

## 2011-10-09 MED ORDER — ESOMEPRAZOLE MAGNESIUM 40 MG PO CPDR: 40.0000 mg | DELAYED_RELEASE_CAPSULE | Freq: Every day | ORAL | Status: DC

## 2011-10-09 NOTE — Patient Instructions (Signed)
Diet for GERD or PUD Nutrition therapy can help ease the discomfort of gastroesophageal reflux disease (GERD) and peptic ulcer disease (PUD).  HOME CARE INSTRUCTIONS   Eat your meals slowly, in a relaxed setting.   Eat 5 to 6 small meals per day.   If a food causes distress, stop eating it for a period of time.  FOODS TO AVOID  Coffee, regular or decaffeinated.   Cola beverages, regular or low calorie.   Tea, regular or decaffeinated.   Pepper.   Cocoa.   High fat foods, including meats.   Butter, margarine, hydrogenated oil (trans fats).   Peppermint or spearmint (if you have GERD).   Fruits and vegetables if not tolerated.   Alcohol.   Nicotine (smoking or chewing). This is one of the most potent stimulants to acid production in the gastrointestinal tract.   Any food that seems to aggravate your condition.  If you have questions regarding your diet, ask your caregiver or a registered dietitian. TIPS  Lying flat may make symptoms worse. Keep the head of your bed raised 6 to 9 inches (15 to 23 cm) by using a foam wedge or blocks under the legs of the bed.   Do not lay down until 3 hours after eating a meal.   Daily physical activity may help reduce symptoms.  MAKE SURE YOU:   Understand these instructions.   Will watch your condition.   Will get help right away if you are not doing well or get worse.  Document Released: 07/13/2005 Document Revised: 07/02/2011 Document Reviewed: 05/29/2011 [facility] Patient Information 2012 [place], [place].  Take Nexium once daily in place of zantac or prilosec.  If symptoms are much improved after the 2 weeks of samples, you can go back to prilosec daily.  You may use this longterm, if needed.  If symptoms recur on Prilosec, call for prescription of Nexium.  If no symptoms on Prilosec, consider changing back to Zantac, or trying to take the prilosec every other day, and taper down to just as needed.

## 2011-10-09 NOTE — Progress Notes (Signed)
Chief complaint--chest/stomach discomfort.  Pt with h/o SVT, unsure if this is related.  His cardiologist recommended he see his PCP.  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  Took a little "breather" then went up the stairs and got that sensation in his chest--described as a wave of feeling "off", like when an elevator stops suddenly.  Had some associated nausea, some gas in chest.  Took some gas-X later that day, and that caused some belching. Denies any associated palpitations or tachycardia.  Has h/o reflux.  Had been on Zantac since time of diagnosis, on and off.  Most recently took 2 weeks of Prilosec.  Had some improvement in symptoms, but worse since going back to Zantac.  He was recently seen with cough.  Cough finally resolved by taking prilosec and zyrtec.  Reflux, however persists.  Took Prilosec for 2 weeks.  Prilosec seemed to work better than the zantac, but had breakthrough symptoms twice during that timeframe.  Just recently went back to taking Zantac (was afraid to take Prilosec for more than the 2 weeks listed on box).   Admits diet is poor. Has a lot of tomato based sauces, burger and fries, pizza, pasta. Occasional chocolate.  Very little caffeine, no alcohol.  Past Medical History  Diagnosis Date  . Tachycardia     supra-ventricular  . Anxiety   . GERD (gastroesophageal reflux disease)   . IBS (irritable bowel syndrome)   . Seasonal allergies   . Renal stone   . PSVT (paroxysmal supraventricular tachycardia)     Past Surgical History  Procedure Date  . Mouth surgery     x3    History   Social History  . Marital Status: Single    Spouse Name: N/A    Number of Children: N/A  . Years of Education: N/A   Occupational History  . Not on file.   Social History Main Topics  . Smoking status: Never Smoker   . Smokeless tobacco: Never Used  . Alcohol Use: No  . Drug Use:  No  . Sexually Active: Not on file   Other Topics Concern  . Not on file   Social History Narrative   Engaged    Family History  Problem Relation Age of Onset  . Coronary artery disease Neg Hx   . Hypertension Neg Hx   . Evelene Croon Parkinson White syndrome Mother   . Diabetes Father     borderline   Current Outpatient Prescriptions on File Prior to Visit  Medication Sig Dispense Refill  . clindamycin (CLINDAGEL) 1 % gel Apply topically 2 (two) times daily.        Marland Kitchen diltiazem (CARDIZEM) 60 MG tablet Take 1 tablet (60 mg total) by mouth 2 (two) times daily.  60 tablet  11  . ibuprofen (ADVIL,MOTRIN) 200 MG tablet Take 200 mg by mouth every 6 (six) hours as needed.        . Multiple Vitamin (MULTIVITAMINS PO) Take by mouth daily.        . OMEGA 3 1000 MG CAPS Take by mouth daily.        Marland Kitchen tretinoin (RETIN-A) 0.025 % cream Apply topically at bedtime.        . ciclesonide (OMNARIS) 50 MCG/ACT nasal spray Place 2 sprays into both nostrils daily.  12.5 g  0   No Known Allergies  ROS:  Denies fevers, URI symptoms, palpitations,  tachycardia, bowel changes, headaches, dizziness, chest pain, skin rash, or other concerns.  PHYSICAL EXAM: BP 136/100  Pulse 97  Temp(Src) 98.9 F (37.2 C) (Oral)  Resp 14  Ht 5\' 8"  (1.727 m)  Wt 164 lb (74.39 kg)  BMI 24.94 kg/m2 134/84 Well developed, mild anxious male, in no distress Neck: no lymphadenopathy, thyromegaly or mass Heart: regular rate and rhythm without murmur Lungs: clear bilaterally Abdomen: Mild epigastric tenderness. No organomegaly or mass. No rebound guarding or tenderness Extremities: no clubbing, cyanosis or edema Skin: no rash  ASSESSMENT/PLAN: 1. GERD (gastroesophageal reflux disease)  esomeprazole (NEXIUM) 40 MG capsule  2. SVT/ PSVT/ PAT     Reassured that symptoms are consistent with reflux.  Educated at length regarding reflux precautions.  Discussed risks of longterm PPI use (which may or may not be necessary) vs  untreated reflux.  Take Nexium once daily in place of zantac or prilosec.  If symptoms are much improved after the 2 weeks of samples, you can go back to prilosec daily.  You may use this longterm, if needed.  If symptoms recur on Prilosec, call for prescription of Nexium.  If no symptoms on Prilosec, consider changing back to Zantac, or trying to take the prilosec every other day, and taper down to just as needed.   F/u prn  Length of visit 25-30 mins face to face, more than 1/2 spent in counseling

## 2011-10-22 ENCOUNTER — Telehealth: Payer: Self-pay | Admitting: Internal Medicine

## 2011-10-22 DIAGNOSIS — K219 Gastro-esophageal reflux disease without esophagitis: Secondary | ICD-10-CM

## 2011-10-22 MED ORDER — ESOMEPRAZOLE MAGNESIUM 40 MG PO CPDR: 40.0000 mg | DELAYED_RELEASE_CAPSULE | Freq: Every day | ORAL | Status: DC

## 2011-10-22 NOTE — Telephone Encounter (Signed)
Advise rx sent

## 2011-10-22 NOTE — Telephone Encounter (Signed)
Pt advised.

## 2011-11-27 ENCOUNTER — Other Ambulatory Visit: Payer: Self-pay | Admitting: Internal Medicine

## 2011-11-27 NOTE — Telephone Encounter (Signed)
Refilled cardizem

## 2011-12-08 ENCOUNTER — Ambulatory Visit (INDEPENDENT_AMBULATORY_CARE_PROVIDER_SITE_OTHER): Payer: BC Managed Care – PPO | Admitting: Medical

## 2011-12-08 ENCOUNTER — Encounter: Payer: Self-pay | Admitting: Medical

## 2011-12-08 DIAGNOSIS — R109 Unspecified abdominal pain: Secondary | ICD-10-CM

## 2011-12-08 DIAGNOSIS — K219 Gastro-esophageal reflux disease without esophagitis: Secondary | ICD-10-CM

## 2011-12-08 DIAGNOSIS — R110 Nausea: Secondary | ICD-10-CM

## 2011-12-08 LAB — COMPREHENSIVE METABOLIC PANEL
Albumin: 5.1 g/dL (ref 3.5–5.2)
Alkaline Phosphatase: 57 U/L (ref 39–117)
BUN: 13 mg/dL (ref 6–23)
CO2: 27 mEq/L (ref 19–32)
Calcium: 10 mg/dL (ref 8.4–10.5)
Glucose, Bld: 85 mg/dL (ref 70–99)
Potassium: 3.8 mEq/L (ref 3.5–5.3)
Sodium: 139 mEq/L (ref 135–145)
Total Protein: 7.3 g/dL (ref 6.0–8.3)

## 2011-12-08 LAB — CBC WITH DIFFERENTIAL/PLATELET
Basophils Relative: 0 % (ref 0–1)
Hemoglobin: 15.7 g/dL (ref 13.0–17.0)
Lymphs Abs: 1.4 10*3/uL (ref 0.7–4.0)
Monocytes Relative: 10 % (ref 3–12)
Neutro Abs: 3.6 10*3/uL (ref 1.7–7.7)
Neutrophils Relative %: 64 % (ref 43–77)
Platelets: 196 10*3/uL (ref 150–400)
RBC: 5.2 MIL/uL (ref 4.22–5.81)

## 2011-12-08 LAB — AMYLASE: Amylase: 50 U/L (ref 0–105)

## 2011-12-08 LAB — LIPASE: Lipase: 20 U/L (ref 0–75)

## 2011-12-08 NOTE — Progress Notes (Signed)
Subjective: Here for abdominal pain.  Pt reports long history of "abdominal problems."  He notes having problems with his digestive tract in his teenage years, but seemed to have less problems from age [age]yo.   But then from 41yo til now, has continued to have consistent frequently GI problems.  He was seen here recently for GERD, placed on Nexium which he has now been taking for 2 months and this has helped his GERD symptoms.  However, this past weekend until now, he notes abdominal discomfort, nausea, and issues with BMs.  Started having some discomfort over the weekend Friday under left rib cage.  Pain was mild, more of a irritant.  Had some discomfort with defecation, but afterwards felt better. Had urges to defecate intermittent.  Saturday was fasting most of the day, but then ate about 10pm, and about 45 min after eating had chest discomfort, bloating, upset stomach, nausea, and that all eased off over time.  Sunday breakfast and lunch was ok, but had similar symptoms of nausea and belly pain later that evening with dinner.  Over the next few days, continues to get some belly discomfort and nausea that seems to corresponds shortly after eating, but pain persistent epigastric to LUQ region.  Light meals seem to go over ok.    Denies drinking alcohol, nonsmoker.   Denies hx/o appendicitis, gall bladder problems, no hx/o pancreatitis.   Denies fever, no urinary issues, no specific diarrhea, but has had urge to defecate and some constipation.   Using some gas x.  Denies blood in stool or urine.  Appetite in general is ok.   Denies prior scans of abdomen.  He denies frequent NSAID use, but he does report lots of stressors.  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   Past Medical History  Diagnosis Date  . Tachycardia     supra-ventricular  . Anxiety   . GERD (gastroesophageal reflux disease)   . IBS  (irritable bowel syndrome)   . Seasonal allergies   . Renal stone   . PSVT (paroxysmal supraventricular tachycardia)   . Acne      Objective:   Physical Exam  Filed Vitals:   12/08/11 1028  BP: 130/80  Pulse: 88  Temp: 98.1 F (36.7 C)  Resp: 16    General appearance: alert, no distress, WD/WN HEENT: normocephalic, sclerae anicteric, TMs pearly, nares patent, no discharge or erythema, pharynx normal Oral cavity: MMM, no lesions Neck: supple, no lymphadenopathy, no thyromegaly, no masses Heart: RRR, normal S1, S2, no murmurs Lungs: CTA bilaterally, no wheezes, rhonchi, or rales Abdomen: +bs, soft, tender epigastric region and left upper quadrant, but otherwise non tender, non distended, no masses, no hepatomegaly, no splenomegaly Pulses: 2+ symmetric   Assessment and Plan :    Encounter Diagnoses  Name Primary?  . Abdominal pain Yes  . Nausea   . GERD (gastroesophageal reflux disease)    Discussed possible etiologies - likely IBS, but differential includes gastritis, ulcer, but less likely H pylori, pancreatitis.  Nevertheless, we will check labs today, advised BRAT diet, hydrate well, can consider OTC Imodium 2-3 times daily, fiber supplement daily, and if not improving in 1-2 wk, and pending labs, consider GI referral for longstanding GI issues in general.   He has never had GI workup.

## 2011-12-08 NOTE — Patient Instructions (Addendum)
For the next 3-5 days, use a bland diet/BRAT diet - bananas, rice, applesauce, toast, water, jello, etc.  Try Imodium OTC 2-3 times daily for spasms and belly cramping and urge to defecate  Begin OTC fiber supplement such as Fibercon or Miralax daily.  Eat more fiber and drink more water in general.   We will call with lab results.    Irritable Bowel Syndrome Irritable Bowel Syndrome (IBS) is caused by a disturbance of normal bowel function. Other terms used are spastic colon, mucous colitis, and irritable colon. It does not require surgery, nor does it lead to cancer. There is no cure for IBS. But with proper diet, stress reduction, and medication, you will find that your problems (symptoms) will gradually disappear or improve. IBS is a common digestive disorder. It usually appears in late adolescence or early adulthood. Women develop it twice as often as men. CAUSES  After food has been digested and absorbed in the small intestine, waste material is moved into the colon (large intestine). In the colon, water and salts are absorbed from the undigested products coming from the small intestine. The remaining residue, or fecal material, is held for elimination. Under normal circumstances, gentle, rhythmic contractions on the bowel walls push the fecal material along the colon towards the rectum. In IBS, however, these contractions are irregular and poorly coordinated. The fecal material is either retained too long, resulting in constipation, or expelled too soon, producing diarrhea. SYMPTOMS  The most common symptom of IBS is pain. It is typically in the lower left side of the belly (abdomen). But it may occur anywhere in the abdomen. It can be felt as heartburn, backache, or even as a dull pain in the arms or shoulders. The pain comes from excessive bowel-muscle spasms and from the buildup of gas and fecal material in the colon. This pain:  Can range from sharp belly (abdominal) cramps to a dull,  continuous ache.   Usually worsens soon after eating.   Is typically relieved by having a bowel movement or passing gas.  Abdominal pain is usually accompanied by constipation. But it may also produce diarrhea. The diarrhea typically occurs right after a meal or upon arising in the morning. The stools are typically soft and watery. They are often flecked with secretions (mucus). Other symptoms of IBS include:  Bloating.   Loss of appetite.   Heartburn.   Feeling sick to your stomach (nausea).   Belching   Vomiting   Gas.  IBS may also cause a number of symptoms that are unrelated to the digestive system:  Fatigue.   Headaches.   Anxiety   Shortness of breath   Difficulty in concentrating.   Dizziness.  These symptoms tend to come and go. DIAGNOSIS  The symptoms of IBS closely mimic the symptoms of other, more serious digestive disorders. So your caregiver may wish to perform a variety of additional tests to exclude these disorders. He/she wants to be certain of learning what is wrong (diagnosis). The nature and purpose of each test will be explained to you. TREATMENT A number of medications are available to help correct bowel function and/or relieve bowel spasms and abdominal pain. Among the drugs available are:  Mild, non-irritating laxatives for severe constipation and to help restore normal bowel habits.   Specific anti-diarrheal medications to treat severe or prolonged diarrhea.   Anti-spasmodic agents to relieve intestinal cramps.   Your caregiver may also decide to treat you with a mild tranquilizer or sedative during  unusually stressful periods in your life.  The important thing to remember is that if any drug is prescribed for you, make sure that you take it exactly as directed. Make sure that your caregiver knows how well it worked for you. HOME CARE INSTRUCTIONS   Avoid foods that are high in fat or oils. Some examples [id number]:[id number] cream, butter, frankfurters,  sausage, and other fatty meats.   Avoid foods that have a laxative effect, such as fruit, fruit juice, and dairy products.   Cut out carbonated drinks, chewing gum, and "gassy" foods, such as beans and cabbage. This may help relieve bloating and belching.   Bran taken with plenty of liquids may help relieve constipation.   Keep track of what foods seem to trigger your symptoms.   Avoid emotionally charged situations or circumstances that produce anxiety.   Start or continue exercising.   Get plenty of rest and sleep.  MAKE SURE YOU:   Understand these instructions.   Will watch your condition.   Will get help right away if you are not doing well or get worse.  Document Released: 07/13/2005 Document Revised: 07/02/2011 Document Reviewed: 03/02/2008 [facility] Patient Information 2012 [employer], [place].

## 2011-12-09 LAB — H. PYLORI ANTIBODY, IGG: H Pylori IgG: <0.4 {ISR}

## 2011-12-09 LAB — TSH: TSH: 1.669 u[IU]/mL (ref 0.350–4.500)

## 2011-12-17 ENCOUNTER — Encounter: Payer: Self-pay | Admitting: Internal Medicine

## 2011-12-17 ENCOUNTER — Ambulatory Visit (INDEPENDENT_AMBULATORY_CARE_PROVIDER_SITE_OTHER): Payer: BC Managed Care – PPO | Admitting: Internal Medicine

## 2011-12-17 VITALS — BP 116/82 | HR 80 | Ht 68.5 in | Wt 167.1 lb

## 2011-12-17 DIAGNOSIS — I471 Supraventricular tachycardia: Secondary | ICD-10-CM

## 2011-12-17 NOTE — Assessment & Plan Note (Signed)
No recurrent tachycardia which he is aware. He would like to come off of his diltiazem I told he could do this at his leisure but I would suggest that he wait until after his wedding

## 2011-12-17 NOTE — Patient Instructions (Signed)
Your physician wants you to follow-up in:  2 years. You will receive a reminder letter in the mail two months in advance. If you don't receive a letter, please call our office to schedule the follow-up appointment.   

## 2011-12-17 NOTE — Progress Notes (Signed)
  HPI  Jordan Stafford is a 41 y.o. male seen in followup for SVT. He elected to undertake medical  therapy and he had been on metoprolol tartrate 25 mg twice daily for long time without clinical recurrences. We switched him to diltiazem; he has had no recurrences on it either. He has had a very stressful year her medically with a cough attributed to allergies and infection in the winter associated with broken ribs and severe reflux. He also tells me he is going to get married next month. His fiance is a Engineer, civil (consulting) at [employer] whom he met on line. He is excited and stressed about this as well as about his job situation  Past Medical History  Diagnosis Date  . Tachycardia     supra-ventricular  . Anxiety   . GERD (gastroesophageal reflux disease)   . IBS (irritable bowel syndrome)   . Seasonal allergies   . Renal stone   . PSVT (paroxysmal supraventricular tachycardia)   . Acne     Past Surgical History  Procedure Date  . Mouth surgery     x3    Current Outpatient Prescriptions  Medication Sig Dispense Refill  . cetirizine (ZYRTEC) 10 MG tablet Take 10 mg by mouth daily.      . clindamycin (CLINDAGEL) 1 % gel Apply topically 2 (two) times daily.        . Digestive Enzymes (PAPAYA AND ENZYMES PO) Take by mouth.      . diltiazem (CARDIZEM) 60 MG tablet TAKE ONE TABLET BY MOUTH TWICE DAILY  60 tablet  2  . esomeprazole (NEXIUM) 40 MG capsule Take 1 capsule (40 mg total) by mouth daily.  30 capsule  5  . Multiple Vitamin (MULTIVITAMINS PO) Take by mouth daily.        . Nutritional Supplements (COLON FORMULA PO) Take by mouth.      . OMEGA 3 1000 MG CAPS Take by mouth daily.        . Probiotic Product (PHILLIPS COLON HEALTH PO) Take by mouth.      . simethicone (MYLICON) 125 MG chewable tablet Chew 125 mg by mouth every 6 (six) hours as needed.      . tretinoin (RETIN-A) 0.025 % cream Apply topically at bedtime.        Marland Kitchen DISCONTD: ranitidine (RANITIDINE 75) 75 MG tablet Take 75 mg by mouth 2  (two) times daily.          No Known Allergies  Review of Systems negative except from HPI and PMH  Physical Exam BP 116/82  Pulse 80  Ht 5' 8.5" (1.74 m)  Wt 167 lb 1.9 oz (75.805 kg)  BMI 25.04 kg/m2 Well developed and well nourished in no acute distress HENT normal E scleral and icterus clear Neck Supple JVP flat; carotids brisk and full Clear to ausculation Regular rate and rhythm, no murmurs gallops or rub Soft with active bowel sounds No clubbing cyanosis none Edema Alert and oriented, grossly normal motor and sensory function Skin Warm and Dry  Electrocardiogram demonstrates sinus rhythm at 80 Intervals 16/09/36 Axis LIX  Assessment and  Plan

## 2012-01-04 ENCOUNTER — Encounter: Payer: BC Managed Care – PPO | Admitting: Family Medicine

## 2012-01-07 ENCOUNTER — Encounter: Payer: Self-pay | Admitting: Family Medicine

## 2012-01-07 ENCOUNTER — Ambulatory Visit (INDEPENDENT_AMBULATORY_CARE_PROVIDER_SITE_OTHER): Payer: BC Managed Care – PPO | Admitting: Family Medicine

## 2012-01-07 VITALS — BP 128/86 | HR 68 | Ht 69.0 in | Wt 163.0 lb

## 2012-01-07 DIAGNOSIS — E785 Hyperlipidemia, unspecified: Secondary | ICD-10-CM | POA: Insufficient documentation

## 2012-01-07 DIAGNOSIS — Z0000 Encounter for general adult medical examination without abnormal findings: Secondary | ICD-10-CM

## 2012-01-07 LAB — LIPID PANEL
HDL: 32 mg/dL — ABNORMAL LOW (ref >39)
LDL Cholesterol: 129 mg/dL — ABNORMAL HIGH (ref 0–99)
Total CHOL/HDL Ratio: 6.3 Ratio
VLDL: 40 mg/dL (ref 0–40)

## 2012-01-07 LAB — POCT URINALYSIS DIPSTICK
Blood, UA: NEGATIVE
Glucose, UA: NEGATIVE
Nitrite, UA: NEGATIVE
Protein, UA: NEGATIVE
Urobilinogen, UA: NEGATIVE

## 2012-01-07 NOTE — Progress Notes (Signed)
Jordan Stafford is a 41 y.o. male who presents for a complete physical.  He has the following concerns:  No further GI problems since last visit--ate bland diet, and advanced back to a normal diet, not having any further abdominal complaints. GERD--well controlled on the Nexium.  Health Maintenance: Immunization History  Administered Date(s) Administered  . Influenza Split 05/27/2011  . Tdap 07/25/2008  flu shots yearly at school Last colonoscopy: never Last PSA: never Dentist: twice yearly Ophtho: once yearly Exercise: limited in the last few weeks (trying to sell house).  Usually exercises 3-5 x/week.  Past Medical History  Diagnosis Date  . Supraventricular tachycardia     Dr Graciela Husbands  . Anxiety   . GERD (gastroesophageal reflux disease)   . IBS (irritable bowel syndrome)   . Seasonal allergies   . Renal stone   . Acne     Past Surgical History  Procedure Date  . Mouth surgery     x3    History   Social History  . Marital Status: Single    Spouse Name: N/A    Number of Children: N/A  . 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 Smoking status: Never Smoker   . Smokeless tobacco: Never Used  . Alcohol Use: No  . Drug Use: No  . Sexually Active: Yes -- Male partner(s)   Other Topics Concern  . Not on file   Social History Narrative   Engaged, getting married 6/29. Lives with fiance, and cockapoo    Family History  Problem Relation Age of Onset  . Coronary artery disease Neg Hx   . Evelene Croon Parkinson White syndrome Mother   . Hypertension Mother   . Diabetes Father     borderline  . Hypertension Father   . Heart disease Maternal Grandmother   . Cancer Paternal Grandfather     lung    Current outpatient prescriptions:cetirizine (ZYRTEC) 10 MG tablet, Take 10 mg by mouth daily., Disp: , Rfl: ;  clindamycin (CLINDAGEL) 1 % gel, Apply topically 2 (two) times daily.  ,  Disp: , Rfl: ;  diltiazem (CARDIZEM) 60 MG tablet, TAKE ONE TABLET BY MOUTH TWICE DAILY, Disp: 60 tablet, Rfl: 2;  esomeprazole (NEXIUM) 40 MG capsule, Take 1 capsule (40 mg total) by mouth daily., Disp: 30 capsule, Rfl: 5 ibuprofen (ADVIL,MOTRIN) 200 MG tablet, Take 200-400 mg by mouth as needed., Disp: , Rfl: ;  Multiple Vitamin (MULTIVITAMINS PO), Take by mouth daily.  , Disp: , Rfl: ;  OMEGA 3 1000 MG CAPS, Take by mouth daily.  , Disp: , Rfl: ;  Probiotic Product (PHILLIPS COLON HEALTH PO), Take by mouth., Disp: , Rfl: ;  simethicone (MYLICON) 125 MG chewable tablet, Chew 125 mg by mouth every 6 (six) hours as needed., Disp: , Rfl:  Digestive Enzymes (PAPAYA AND ENZYMES PO), Take by mouth., Disp: , Rfl: ;  tretinoin (RETIN-A) 0.025 % cream, Apply topically at bedtime.  , Disp: , Rfl: ;  DISCONTD: ranitidine (RANITIDINE 75) 75 MG tablet, Take 75 mg by mouth 2 (two) times daily.  , Disp: , Rfl:   No Known Allergies  ROS:  The patient denies anorexia, fever, weight changes, headaches,  vision loss, decreased hearing, ear pain, hoarseness, chest pain, dizziness, syncope, dyspnea on exertion, cough, swelling, nausea, vomiting, diarrhea, constipation, abdominal pain, melena, hematochezia, indigestion/heartburn, hematuria, incontinence, erectile dysfunction, nocturia, weakened urine stream, dysuria,  genital lesions, joint pains, numbness, tingling, weakness, tremor, suspicious skin lesions, depression, abnormal bleeding/bruising, or enlarged lymph nodes  Occasional hand cramp.  Some anxiety related to upcoming wedding, hoping for interim department-head at work.  Denies insomnia. Occasional heart flutter, but no recurrences of SVT   PHYSICAL EXAM: BP 122/90  Pulse 68  Ht 5\' 9"  (1.753 m)  Wt 163 lb (73.936 kg)  BMI 24.07 kg/m2 128/86 on repeat by MD, RA General Appearance:    Alert, cooperative, no distress, appears stated age  Head:    Normocephalic, without obvious abnormality, atraumatic  Eyes:     PERRL, conjunctiva/corneas clear, EOM's intact, fundi    benign  Ears:    Normal TM's and external ear canals  Nose:   Nares normal, mucosa normal, no drainage or sinus   tenderness  Throat:   Lips, mucosa, and tongue normal; teeth and gums normal  Neck:   Supple, no lymphadenopathy;  thyroid:  no   enlargement/tenderness/nodules; no carotid   bruit or JVD  Back:    Spine nontender, no curvature, ROM normal, no CVA     tenderness  Lungs:     Clear to auscultation bilaterally without wheezes, rales or     ronchi; respirations unlabored  Chest Wall:    No tenderness or deformity   Heart:    Regular rate and rhythm, S1 and S2 normal, no murmur, rub   or gallop  Breast Exam:    No chest wall tenderness, masses or gynecomastia  Abdomen:     Soft, non-tender, nondistended, normoactive bowel sounds,    no masses, no hepatosplenomegaly  Genitalia:    Normal male external genitalia without lesions.  Testicles without masses.  No inguinal hernias.  Rectal:    Normal sphincter tone, no masses or tenderness; guaiac negative stool.  Prostate smooth, no nodules, not enlarged.  Extremities:   No clubbing, cyanosis or edema  Pulses:   2+ and symmetric all extremities  Skin:   Skin color, texture, turgor normal, no rashes or lesions; some cystic acne lesions on chin/neck.  Lymph nodes:   Cervical, supraclavicular, and axillary nodes normal  Neurologic:   CNII-XII intact, normal strength, sensation and gait; reflexes 2+ and symmetric throughout          Psych:   Normal mood, affect, hygiene and grooming.     ASSESSMENT/PLAN:  1. Routine general medical examination at a health care facility  POCT Urinalysis Dipstick, Visual acuity screening, Lipid panel   Recommended at least 30 minutes of aerobic activity at least 5 days/week; proper sunscreen use reviewed; healthy diet and alcohol recommendations (less than or equal to 2 drinks/day) reviewed; regular seatbelt use; changing batteries in smoke detectors.  Self-testicular exams. Immunization recommendations discussed.  Colonoscopy recommendations reviewed--age 76.

## 2012-01-07 NOTE — Patient Instructions (Addendum)
HEALTH MAINTENANCE RECOMMENDATIONS:  It is recommended that you get at least 30 minutes of aerobic exercise at least 5 days/week (for weight loss, you may need as much as 60-90 minutes). This can be any activity that gets your heart rate up. This can be divided in 10-15 minute intervals if needed, but try and build up your endurance at least once a week.  Weight bearing exercise is also recommended twice weekly.  Eat a healthy diet with lots of vegetables, fruits and fiber.  "Colorful" foods have a lot of vitamins (ie green vegetables, tomatoes, red peppers, etc).  Limit sweet tea, regular sodas and alcoholic beverages, all of which has a lot of calories and sugar.  Up to 2 alcoholic drinks daily may be beneficial for men (unless trying to lose weight, watch sugars).  Drink a lot of water.  Sunscreen of at least SPF 30 should be used on all sun-exposed parts of the skin when outside between the hours of 10 am and 4 pm (not just when at beach or pool, but even with exercise, golf, tennis, and yard work!)  Use a sunscreen that says "broad spectrum" so it covers both UVA and UVB rays, and make sure to reapply every 1-2 hours.  Remember to change the batteries in your smoke detectors when changing your clock times in the spring and fall.  Use your seat belt every time you are in a car, and please drive safely and not be distracted with cell phones and texting while driving.  Try to cut back on salt in your diet.  2 Gram Low Sodium Diet A 2 gram sodium diet restricts the amount of sodium in the diet to no more than 2 g or 2000 mg daily. Limiting the amount of sodium is often used to help lower blood pressure. It is important if you have heart, liver, or kidney problems. Many foods contain sodium for flavor and sometimes as a preservative. When the amount of sodium in a diet needs to be low, it is important to know what to look for when choosing foods and drinks. The following includes some information and  guidelines to help make it easier for you to adapt to a low sodium diet. QUICK TIPS  Do not add salt to food.   Avoid convenience items and fast food.   Choose unsalted snack foods.   Buy lower sodium products, often labeled as "lower sodium" or "no salt added."   Check food labels to learn how much sodium is in 1 serving.   When eating at a restaurant, ask that your food be prepared with less salt or none, if possible.  READING FOOD LABELS FOR SODIUM INFORMATION The nutrition facts label is a good place to find how much sodium is in foods. Look for products with no more than 500 to 600 mg of sodium per meal and no more than 150 mg per serving. Remember that 2 g = 2000 mg. The food label may also list foods as:  Sodium-free: Less than 5 mg in a serving.   Very low sodium: 35 mg or less in a serving.   Low-sodium: 140 mg or less in a serving.   Light in sodium: 50% less sodium in a serving. For example, if a food that usually has 300 mg of sodium is changed to become light in sodium, it will have 150 mg of sodium.   Reduced sodium: 25% less sodium in a serving. For example, if a food that usually  has 400 mg of sodium is changed to reduced sodium, it will have 300 mg of sodium.  CHOOSING FOODS Grains  Avoid: Salted crackers and snack items. Some cereals, including instant hot cereals. Bread stuffing and biscuit mixes. Seasoned rice or pasta mixes.   Choose: Unsalted snack items. Low-sodium cereals, oats, puffed wheat and rice, shredded wheat. English muffins and bread. Pasta.  Meats  Avoid: Salted, canned, smoked, spiced, pickled meats, including fish and poultry. Bacon, ham, sausage, cold cuts, hot dogs, anchovies.   Choose: Low-sodium canned tuna and salmon. Fresh or frozen meat, poultry, and fish.  Dairy  Avoid: Processed cheese and spreads. Cottage cheese. Buttermilk and condensed milk. Regular cheese.   Choose: Milk. Low-sodium cottage cheese. Yogurt. Sour cream.  Low-sodium cheese.  Fruits and Vegetables  Avoid: Regular canned vegetables. Regular canned tomato sauce and paste. Frozen vegetables in sauces. Olives. Rosita Fire. Relishes. Sauerkraut.   Choose: Low-sodium canned vegetables. Low-sodium tomato sauce and paste. Frozen or fresh vegetables. Fresh and frozen fruit.  Condiments  Avoid: Canned and packaged gravies. Worcestershire sauce. Tartar sauce. Barbecue sauce. Soy sauce. Steak sauce. Ketchup. Onion, garlic, and table salt. Meat flavorings and tenderizers.   Choose: Fresh and dried herbs and spices. Low-sodium varieties of mustard and ketchup. Lemon juice. Tabasco sauce. Horseradish.  SAMPLE 2 GRAM SODIUM MEAL PLAN Breakfast / Sodium (mg)  1 cup low-fat milk / 143 mg   2 slices whole-wheat toast / 270 mg   1 tbs heart-healthy margarine / 153 mg   1 hard-boiled egg / 139 mg   1 small orange / 0 mg  Lunch / Sodium (mg)  1 cup raw carrots / 76 mg    cup hummus / 298 mg   1 cup low-fat milk / 143 mg    cup red grapes / 2 mg   1 whole-wheat pita bread / 356 mg  Dinner / Sodium (mg)  1 cup whole-wheat pasta / 2 mg   1 cup low-sodium tomato sauce / 73 mg   3 oz lean ground beef / 57 mg   1 small side salad (1 cup raw spinach leaves,  cup cucumber,  cup yellow bell pepper) with 1 tsp olive oil and 1 tsp red wine vinegar / 25 mg  Snack / Sodium (mg)  1 container low-fat vanilla yogurt / 107 mg   3 graham cracker squares / 127 mg  Nutrient Analysis  Calories: 2033   Protein: 77 g   Carbohydrate: 282 g   Fat: 72 g   Sodium: 1971 mg  Document Released: 07/13/2005 Document Revised: 07/02/2011 Document Reviewed: 10/14/2009 [facility] Patient Information 2012 [place], [place].

## 2012-01-19 ENCOUNTER — Telehealth: Payer: Self-pay | Admitting: Family Medicine

## 2012-01-21 NOTE — Telephone Encounter (Signed)
LM

## 2012-02-15 ENCOUNTER — Ambulatory Visit (INDEPENDENT_AMBULATORY_CARE_PROVIDER_SITE_OTHER): Payer: BC Managed Care – PPO | Admitting: Medical

## 2012-02-15 ENCOUNTER — Encounter: Payer: Self-pay | Admitting: Medical

## 2012-02-15 VITALS — BP 112/80 | HR 80 | Temp 98.0°F | Resp 16 | Wt 167.0 lb

## 2012-02-15 DIAGNOSIS — L259 Unspecified contact dermatitis, unspecified cause: Secondary | ICD-10-CM

## 2012-02-15 MED ORDER — TRIAMCINOLONE ACETONIDE 0.1 % EX CREA: TOPICAL_CREAM | Freq: Two times a day (BID) | CUTANEOUS | Status: DC

## 2012-02-15 NOTE — Progress Notes (Signed)
Subjective: Here for 1 wk hx/o rash on arms and legs.  A week ago was trimming a tree and removing tree limbs, but no other recent exposures.  Used hydrocortisone some with some relief.  No other aggravating or relieving factors.   No other c/o.  No fever, no contacts with similar.   Objective: Gen: wd, wn, nad Skin: left antecubital region with several patches of erythema, somewhat whealed lesions, forearms and left leg with several small scattered patches of whealed lesions, all suggestive of contact dermatitis  Assessment: Encounter Diagnosis  Name Primary?  . Contact dermatitis Yes   Plan: Script for triamcinolone cream, c/t zyrtec, keep areas cleaned with soap and water, avoid reexposure.  Call or return if worse or not improving.

## 2012-02-25 ENCOUNTER — Other Ambulatory Visit: Payer: Self-pay | Admitting: Internal Medicine

## 2012-04-12 ENCOUNTER — Other Ambulatory Visit: Payer: Self-pay | Admitting: Family Medicine

## 2012-04-24 ENCOUNTER — Other Ambulatory Visit: Payer: Self-pay | Admitting: Internal Medicine

## 2012-05-31 ENCOUNTER — Other Ambulatory Visit: Payer: Self-pay | Admitting: Internal Medicine

## 2012-10-03 ENCOUNTER — Telehealth: Payer: Self-pay | Admitting: Family Medicine

## 2012-10-03 NOTE — Telephone Encounter (Signed)
Patient informed of Dr.Knapp's recommendations. 

## 2012-10-03 NOTE — Telephone Encounter (Signed)
If poorly controlled, can take the nexium twice daily until his visit later this week. Or can use mylanta or other antacid just prn

## 2012-10-05 ENCOUNTER — Ambulatory Visit (INDEPENDENT_AMBULATORY_CARE_PROVIDER_SITE_OTHER): Payer: BC Managed Care – PPO | Admitting: Family Medicine

## 2012-10-05 ENCOUNTER — Encounter: Payer: Self-pay | Admitting: Family Medicine

## 2012-10-05 VITALS — BP 130/100 | HR 68 | Ht 69.0 in | Wt 171.0 lb

## 2012-10-05 DIAGNOSIS — R0609 Other forms of dyspnea: Secondary | ICD-10-CM

## 2012-10-05 DIAGNOSIS — K219 Gastro-esophageal reflux disease without esophagitis: Secondary | ICD-10-CM

## 2012-10-05 DIAGNOSIS — F411 Generalized anxiety disorder: Secondary | ICD-10-CM

## 2012-10-05 DIAGNOSIS — R0989 Other specified symptoms and signs involving the circulatory and respiratory systems: Secondary | ICD-10-CM

## 2012-10-05 DIAGNOSIS — Z113 Encounter for screening for infections with a predominantly sexual mode of transmission: Secondary | ICD-10-CM

## 2012-10-05 DIAGNOSIS — R5381 Other malaise: Secondary | ICD-10-CM

## 2012-10-05 LAB — CBC WITH DIFFERENTIAL/PLATELET
Eosinophils Absolute: 0.1 10*3/uL (ref 0.0–0.7)
Eosinophils Relative: 1 % (ref 0–5)
Lymphs Abs: 1.7 10*3/uL (ref 0.7–4.0)
MCH: 30.9 pg (ref 26.0–34.0)
MCHC: 36.1 g/dL — ABNORMAL HIGH (ref 30.0–36.0)
MCV: 85.5 fL (ref 78.0–100.0)
Platelets: 222 10*3/uL (ref 150–400)
RBC: 5.02 MIL/uL (ref 4.22–5.81)
RDW: 13.9 % (ref 11.5–15.5)

## 2012-10-05 MED ORDER — DEXLANSOPRAZOLE 60 MG PO CPDR: 60.0000 mg | DELAYED_RELEASE_CAPSULE | Freq: Every day | ORAL | Status: DC

## 2012-10-05 NOTE — Patient Instructions (Addendum)
Try Dexilant in place of Nexium (just once daily). We will be in touch with your labs. Please try and follow appropriate diet  Gastroesophageal Reflux Disease, Adult Gastroesophageal reflux disease (GERD) happens when acid from your stomach flows up into the esophagus. When acid comes in contact with the esophagus, the acid causes soreness (inflammation) in the esophagus. Over time, GERD may create small holes (ulcers) in the lining of the esophagus. CAUSES   Increased body weight. This puts pressure on the stomach, making acid rise from the stomach into the esophagus.  Smoking. This increases acid production in the stomach.  Drinking alcohol. This causes decreased pressure in the lower esophageal sphincter (valve or ring of muscle between the esophagus and stomach), allowing acid from the stomach into the esophagus.  Late evening meals and a full stomach. This increases pressure and acid production in the stomach.  A malformed lower esophageal sphincter. Sometimes, no cause is found. SYMPTOMS   Burning pain in the lower part of the mid-chest behind the breastbone and in the mid-stomach area. This may occur twice a week or more often.  Trouble swallowing.  Sore throat.  Dry cough.  Asthma-like symptoms including chest tightness, shortness of breath, or wheezing. DIAGNOSIS  Your caregiver may be able to diagnose GERD based on your symptoms. In some cases, X-rays and other tests may be done to check for complications or to check the condition of your stomach and esophagus. TREATMENT  Your caregiver may recommend over-the-counter or prescription medicines to help decrease acid production. Ask your caregiver before starting or adding any new medicines.  HOME CARE INSTRUCTIONS   Change the factors that you can control. Ask your caregiver for guidance concerning weight loss, quitting smoking, and alcohol consumption.  Avoid foods and drinks that make your symptoms worse, such  as:  Caffeine or alcoholic drinks.  Chocolate.  Peppermint or mint flavorings.  Garlic and onions.  Spicy foods.  Citrus fruits, such as oranges, lemons, or limes.  Tomato-based foods such as sauce, chili, salsa, and pizza.  Fried and fatty foods.  Avoid lying down for the 3 hours prior to your bedtime or prior to taking a nap.  Eat small, frequent meals instead of large meals.  Wear loose-fitting clothing. Do not wear anything tight around your waist that causes pressure on your stomach.  Raise the head of your bed 6 to 8 inches with wood blocks to help you sleep. Extra pillows will not help.  Only take over-the-counter or prescription medicines for pain, discomfort, or fever as directed by your caregiver.  Do not take aspirin, ibuprofen, or other nonsteroidal anti-inflammatory drugs (NSAIDs). SEEK IMMEDIATE MEDICAL CARE IF:   You have pain in your arms, neck, jaw, teeth, or back.  Your pain increases or changes in intensity or duration.  You develop nausea, vomiting, or sweating (diaphoresis).  You develop shortness of breath, or you faint.  Your vomit is green, yellow, black, or looks like coffee grounds or blood.  Your stool is red, bloody, or black. These symptoms could be signs of other problems, such as heart disease, gastric bleeding, or esophageal bleeding. MAKE SURE YOU:   Understand these instructions.  Will watch your condition.  Will get help right away if you are not doing well or get worse. Document Released: 04/22/2005 Document Revised: 10/05/2011 Document Reviewed: 01/30/2011 [employer] Patient Information 2013 [place], [place].

## 2012-10-05 NOTE — Progress Notes (Signed)
Chief Complaint  Patient presents with  . Gastrophageal Reflux    having some chest pressure and reflux, mild nausea. Taking 40 of Nexium, not completely helping. Took some Tums in addition to his Nexium-helped somewhat, again not completely.    GERD--He was able to get off the Nexium in September, and use Zantac 150mg  BID with adequate results.  This kept things under control until February.  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  Buying/selling home.  Trying for pregnancy--wife seeing fertility clinic.  He brings in rx to get labs drawn and faxed to [facility]. The last weekend in February he had changed back to Nexium, when zantac no longer effective in treating reflux.  This seemed to help, but only  for 3-5 days.  Over spring break (last week) had increased fatigue, abdominal discomfort "not feeling well" with exertion.  Feeling winded also when using the elliptical.  Not getting other regular exercise.  Denies tightness in chest or trouble breathing, just feels fatigued and needs to rest, feels a little "winded".    If/when he isn't feeling well, pulse usually runs 100 at rest (typical for him; usually 80's when feeling fine)--ongoing since tachycardia was diagnosed.  BP's have been running "perfect" 115/78 when feeling well.  When feeling "bad"--reflux, admits to feeling anxious about feeling bad-- 130/100   He is complaining of mild pressure in solar plexus area, comes/goes.  No clear correlation with exercise or with eating. Comes and goes  Adding Tums as needed over the last couple of days, in addition to the Nexium, decreased symptoms, made it tolerable.  Has been off the diltiazem since October, per Dr. Graciela Husbands, after stressors decreased.  Other than stress, no changes in diet.  Admits that diet hasn't been good--eating large meals, craving junk foods (pizza, hamburgers). No longer drinking soda, avoids all caffeine.   Occasional chocolate cake.  +flavored water (citrus).  Past Medical History  Diagnosis Date  . Supraventricular tachycardia     Dr Graciela Husbands  . Anxiety   . GERD (gastroesophageal reflux disease)   . IBS (irritable bowel syndrome)   . Seasonal allergies   . Renal stone   . Acne    Past Surgical History  Procedure Laterality Date  . Mouth surgery      x3   History   Social History  . Marital Status: Single    Spouse Name: N/A    Number of Children: N/A  . 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 Smoking status: Never Smoker   . Smokeless tobacco: Never Used  . Alcohol Use: No  . Drug Use: No  . Sexually Active: Yes -- Male partner(s)   Other Topics Concern  . Not on file   Social History Narrative   Married 12/2011, has a cockapoo   Current Outpatient Prescriptions on File Prior to Visit  Medication Sig Dispense Refill  . cetirizine (ZYRTEC) 10 MG tablet Take 10 mg by mouth daily.      . Multiple Vitamin (MULTIVITAMINS PO) Take by mouth daily.        Marland Kitchen NEXIUM 40 MG capsule TAKE ONE CAPSULE BY MOUTH ONE TIME DAILY  30 capsule  4  . OMEGA 3 1000 MG CAPS Take by mouth daily.       . Probiotic Product (PHILLIPS COLON HEALTH PO) Take by mouth.      Marland Kitchen  simethicone (MYLICON) 125 MG chewable tablet Chew 125 mg by mouth every 6 (six) hours as needed.      . Digestive Enzymes (PAPAYA AND ENZYMES PO) Take by mouth.      Marland Kitchen ibuprofen (ADVIL,MOTRIN) 200 MG tablet Take 200-400 mg by mouth as needed.      . [DISCONTINUED] ranitidine (RANITIDINE 75) 75 MG tablet Take 75 mg by mouth 2 (two) times daily.         No current facility-administered medications on file prior to visit.   No Known Allergies  ROS:  Denies fevers, URI symptoms, headaches, dizziness, palpitations, nausea, vomiting, bowel changes (IBS, unchanged), bleeding, bruising.  +fatigue, DOE. no chest pain.  +stress.  Denies depression.   See HPI  PHYSICAL EXAM: BP 130/100  Pulse 68  Ht 5\' 9"  (1.753 m)  Wt 171 lb (77.565 kg)  BMI 25.24 kg/m2 Mildly anxious appearing male in no distress HEENT:  PERRL, conjunctiva clear, OP clear Neck: no lymphadenopathy, thyromegaly or bruit Heart: regular rate and rhythm without murmur Lungs: clear bilaterally Abdomen: soft, nontender, no organomegaly or mass.  Minimal epigastric tenderness.  Negative Murphy's Extremities: no edema Neuro: alert and oriented.  Normal cranial nerves, strength, gait Skin: no rashes  EKG: NRS, rate 78.  LAE otherwise normal  ASSESSMENT/PLAN: Other malaise and fatigue - Plan: Comprehensive metabolic panel, CBC with Differential, Vitamin D 25 hydroxy, TSH  DOE (dyspnea on exertion) - Plan: CBC with Differential, TSH, EKG 12-Lead, EKG 12-Lead  GERD (gastroesophageal reflux disease) - Plan: dexlansoprazole (DEXILANT) 60 MG capsule  Anxiety state, unspecified  Screening examination for venereal disease - Plan: Hepatitis B surface antigen, Hepatitis B core antibody, total, Hepatitis C Antibody, RPR, HIV antibody, GC/chlamydia probe amp, urine  DOE--EKG and labs dexilant rx (no samples available, copay savings card given)--to use once daily to see if more effective than Nexium.  If not covered, can temporarily increase Nexium to BID for a few weeks, but needs to improve diet.  Cbc,chem, tsh  HepBsAg, HBcore total, HCV, T.pallidum IgG, HIV, CT/NG NAA for fertility w/u--brings in rx Fax to [facility] [phone]  (dx V74.5)  F/u in 2-4 weeks if not improving, sooner prn worsening or new symptoms develop

## 2012-10-06 LAB — COMPREHENSIVE METABOLIC PANEL
ALT: 39 U/L (ref 0–53)
AST: 27 U/L (ref 0–37)
Alkaline Phosphatase: 65 U/L (ref 39–117)
Sodium: 140 mEq/L (ref 135–145)
Total Bilirubin: 0.9 mg/dL (ref 0.3–1.2)
Total Protein: 7.6 g/dL (ref 6.0–8.3)

## 2012-10-06 LAB — HEPATITIS B CORE ANTIBODY, TOTAL: Hep B Core Total Ab: NEGATIVE

## 2012-10-06 LAB — HIV ANTIBODY (ROUTINE TESTING W REFLEX): HIV: NONREACTIVE

## 2012-10-06 LAB — RPR

## 2012-10-06 LAB — HEPATITIS C ANTIBODY: HCV Ab: NEGATIVE

## 2012-10-06 LAB — GC/CHLAMYDIA PROBE AMP, URINE: Chlamydia, Swab/Urine, PCR: NEGATIVE

## 2012-10-10 ENCOUNTER — Telehealth: Payer: Self-pay | Admitting: Family Medicine

## 2012-10-10 NOTE — Telephone Encounter (Signed)
Received prior auth form for Dexilant 60 mg, sent to Dr. Lynelle Doctor to be completed

## 2012-10-18 ENCOUNTER — Telehealth: Payer: Self-pay | Admitting: Family Medicine

## 2012-10-24 ENCOUNTER — Telehealth: Payer: Self-pay | Admitting: Family Medicine

## 2012-10-24 NOTE — Telephone Encounter (Signed)
DR KNAPP  DO YOU WANT TO USE WITHER NEXIUM OR OMEPRAZOLE, OR WRITE AN APPEAL LETTER TO [employer]

## 2012-10-25 NOTE — Telephone Encounter (Signed)
I don't believe that pt tried Nexium 40mg  BID.  Advise pt that is what we need to try, Dexilant was denied.  Okay to send rx for Nexium 40mg  #60 with 2 refills, if needed.  I don't believe he had intolerance, just that it wasn't effective, in which case BID dosing might work

## 2012-10-26 ENCOUNTER — Telehealth: Payer: Self-pay | Admitting: Family Medicine

## 2012-10-26 MED ORDER — ESOMEPRAZOLE MAGNESIUM 40 MG PO CPDR: 40.0000 mg | DELAYED_RELEASE_CAPSULE | Freq: Two times a day (BID) | ORAL | Status: DC

## 2012-10-26 NOTE — Telephone Encounter (Signed)
LM

## 2012-11-14 ENCOUNTER — Telehealth: Payer: Self-pay | Admitting: Family Medicine

## 2012-11-14 NOTE — Telephone Encounter (Signed)
Pt called and states Nexium BID is working well he has noticed an improvement, not having any of the same issues.  Does he keep taking this high dose?  Is it ok or does he ween back down?  Please advise pt [phone]

## 2012-11-14 NOTE — Telephone Encounter (Signed)
Advise pt--after being on BID dosing for a month, if his stressors/diet have improved (it was mainly stressors more than diet), then he can try cutting back to once daily Nexium dosing.  If reflux symptoms recur, then increase back to BID for another month or two then try again.

## 2012-11-15 NOTE — Telephone Encounter (Signed)
Patient is aware of what Dr. Lynelle Doctor recommend for his medication. CLS

## 2012-11-15 NOTE — Telephone Encounter (Signed)
LMOM TO CB. CLS 

## 2012-12-20 ENCOUNTER — Other Ambulatory Visit: Payer: Self-pay | Admitting: Family Medicine

## 2013-01-11 ENCOUNTER — Encounter: Payer: Self-pay | Admitting: Family Medicine

## 2013-01-11 ENCOUNTER — Ambulatory Visit (INDEPENDENT_AMBULATORY_CARE_PROVIDER_SITE_OTHER): Payer: BC Managed Care – PPO | Admitting: Family Medicine

## 2013-01-11 VITALS — BP 116/88 | HR 76 | Ht 69.0 in | Wt 169.0 lb

## 2013-01-11 DIAGNOSIS — R1013 Epigastric pain: Secondary | ICD-10-CM

## 2013-01-11 DIAGNOSIS — K219 Gastro-esophageal reflux disease without esophagitis: Secondary | ICD-10-CM

## 2013-01-11 MED ORDER — DEXLANSOPRAZOLE 60 MG PO CPDR: 60.0000 mg | DELAYED_RELEASE_CAPSULE | Freq: Every day | ORAL | Status: DC

## 2013-01-11 NOTE — Progress Notes (Signed)
Chief Complaint  Patient presents with  . Gastrophageal Reflux    went down to 1 nexium daily, had to go back up to 2 daily, this is not really workin. He is having a pulsating sensation in the center of his chest.  If he doesn't eat in the morning heartburn is really bad, subsides once he eats.    Went up to 40mg  BID of Nexium for a month with complete resolution of reflux symptoms after last visit.  After going back down to once daily dosing, he did fine for about a month, but then had a very stressful week, and symptoms recurred.  He went back up to BID of Nexium 2 weeks ago, but still doesn't feel right.  Sometimes he feels "blah", sometimes has a crampy, pulsating sensation in upper stomach.  He is elevating the head of the bed, has improved his diet.  He doesn't have pain when he wakes up, but if he doesn't eat right away, he has more discomfort.  Feels better in the evenings, and if he eats prior to getting hungry--otherwise it starts to get more uncomfortable.  Denies dysphagia (very intermittent, short-lived).  Denies vomiting, diarrhea, bowel changes.  We tried to get prior authorization for Dexilant but it was denied.  He recalls taking it many years ago (when called [facility]) with good results.  Did not get authorized, but at that time was doing well on BID Nexium.  He is no longer having symptoms controlled with this medication, so can retry getting authorization  Past Medical History  Diagnosis Date  . Supraventricular tachycardia     Dr Graciela Husbands  . Anxiety   . GERD (gastroesophageal reflux disease)   . IBS (irritable bowel syndrome)   . Seasonal allergies   . Renal stone   . Acne    Past Surgical History  Procedure Laterality Date  . Mouth surgery      x3   History   Social History  . Marital Status: Married    Spouse Name: N/A    Number of Children: N/A  . 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 Smoking status: Never Smoker   . Smokeless tobacco: Never Used  . Alcohol Use: No  . Drug Use: No  . Sexually Active: Yes -- Male partner(s)   Other Topics Concern  . Not on file   Social History Narrative   Married 12/2011, has a cockapoo   Current Outpatient Prescriptions on File Prior to Visit  Medication Sig Dispense Refill  . cetirizine (ZYRTEC) 10 MG tablet Take 10 mg by mouth daily.      . Digestive Enzymes (PAPAYA AND ENZYMES PO) Take by mouth.      . Multiple Vitamin (MULTIVITAMINS PO) Take by mouth daily.        Marland Kitchen NEXIUM 40 MG capsule TAKE ONE CAPSULE BY MOUTH TWICE DAILY  60 capsule  0  . OMEGA 3 1000 MG CAPS Take 1,000-2,000 mg by mouth daily.       . Probiotic Product (PHILLIPS COLON HEALTH PO) Take by mouth.      Marland Kitchen ibuprofen (ADVIL,MOTRIN) 200 MG tablet Take 200-400 mg by mouth as needed.      . simethicone (MYLICON) 125 MG chewable tablet Chew 125 mg by mouth every 6 (six) hours as needed.      . [DISCONTINUED] ranitidine (RANITIDINE 75) 75 MG tablet Take 75  mg by mouth 2 (two) times daily.         No current facility-administered medications on file prior to visit.   No Known Allergies  ROS:  Denies fevers, URI symptoms, cough, shortness of breath, nausea, vomiting, bowel changes, bleeding/bruising, rashes, or other complaints.   PHYSICAL EXAM: BP 116/88  Pulse 76  Ht 5\' 9"  (1.753 m)  Wt 169 lb (76.658 kg)  BMI 24.95 kg/m2 Pleasant male, in no distress Neck: no lymphadenopathy or mass Heart: regular rate and rhythm without murmur Lungs: clear bilaterally Abdomen: soft, mild epigastric tenderness. No rebound or guarding, no hepatosplenomegaly He is mildly tender over xiphoid tenderness, and over distal sternum Extremities: no edema Skin: no rash Psych: normal mood, affect, hygiene and grooming Neuro: alert and oriented. Normal gait, strength, CN intact.  ASSESSMENT/PLAN:  GERD (gastroesophageal reflux disease) - Plan: H. pylori  antibody, IgG, dexlansoprazole (DEXILANT) 60 MG capsule  Abdominal pain, epigastric - Plan: H. pylori antibody, IgG  Check H pylori Ab's dexilant samples given x 10 days;  To take this IN PLACE of the Nexium. Call in 1-2 weeks if symptoms NOT better (and H pylori negative) for referral to GI for eval and endoscopy. If symptoms improved on Dexilant samples, call to have Vernona Rieger try again for prior auth, now knowing that 40mg  of Nexium BID isn't controlling symptoms. Briefly reviewed reflux precautions.  I'm concerned about possible ulcer given increased symptoms on empty stomach, relieved by food, but Nexium should be treating this; therefore r/u H pylori infection as contributing factor.

## 2013-01-11 NOTE — Patient Instructions (Signed)
We are checking for Helicobacter pylori infection in your bloodwork today.  If it shows this infection, you will be started on medications.  Call in 1-2 weeks if symptoms NOT better (and H pylori negative) for referral to GI for evaluation and endoscopy. If symptoms improved on Dexilant samples, call to have Vernona Rieger try again for prior authorization, now knowing that 40mg  of Nexium twice daily isn't controlling symptoms.

## 2013-01-17 ENCOUNTER — Telehealth: Payer: Self-pay | Admitting: Family Medicine

## 2013-01-17 NOTE — Telephone Encounter (Signed)
SAMPLES DEXILANT GIVEN TO HOLD PT WHILE WORKING ON P.A.

## 2013-01-20 NOTE — Telephone Encounter (Signed)
Appeal letter sent 

## 2013-02-08 ENCOUNTER — Encounter: Payer: Self-pay | Admitting: Family Medicine

## 2013-02-08 ENCOUNTER — Telehealth: Payer: Self-pay | Admitting: Family Medicine

## 2013-02-08 ENCOUNTER — Ambulatory Visit (INDEPENDENT_AMBULATORY_CARE_PROVIDER_SITE_OTHER): Payer: BC Managed Care – PPO | Admitting: Family Medicine

## 2013-02-08 VITALS — BP 130/90 | HR 72 | Ht 69.0 in | Wt 170.0 lb

## 2013-02-08 DIAGNOSIS — F411 Generalized anxiety disorder: Secondary | ICD-10-CM

## 2013-02-08 DIAGNOSIS — K219 Gastro-esophageal reflux disease without esophagitis: Secondary | ICD-10-CM

## 2013-02-08 NOTE — Telephone Encounter (Signed)
LM

## 2013-02-08 NOTE — Progress Notes (Signed)
Chief Complaint  Patient presents with  . Follow-up    on GERD.    Patient presents accompanied by his wife with some ongoing GI complaints, although much improved.  We were able to get prior auth for Dexilant, and things are improved on the whole, but not completely managing his symptoms.   Recent trip to [place], with stress of travel, he had upset stomach with some nausea, which lasted about an hour. Similar episode on the day he returned to work--had some nausea, and symptoms improved after eating, within an hour. He thinks he has more GI symptoms when his stomach is empty, and his wife feels like he fixates on this a little, and is "clock-watching" and making himself more anxious.  He reports that 8-10am is when he might have symptoms, with minor symptoms even if he has eaten. He eats 6-6:30 am, and if he doesn't get another snack in by 8-10 he might have some mild-mod nausea.  He feels better after eating, in 10-15 mins.  There are times that he will go long times without eating and has no problems, usually when distracted.  He is no longer having the crampy pulsating feeling in his epigastrium.  Denies any dysphagia.  No vomiting, diarrhea, or other bowel changes.  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  Also had some right shoulder pain.  Past Medical History  Diagnosis Date  . Supraventricular tachycardia     Dr Graciela Husbands  . Anxiety   . GERD (gastroesophageal reflux disease)   . IBS (irritable bowel syndrome)   . Seasonal allergies   . Renal stone   . Acne    Past Surgical History  Procedure Laterality Date  . Mouth surgery      x3  . Toenail excision      ingrown nail   History   Social History  . Marital Status: Married    Spouse Name: N/A    Number of Children: N/A  . 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 Smoking status: Never Smoker   . Smokeless tobacco: Never Used  . Alcohol Use: No  . Drug Use: No  . Sexually Active: Yes -- Male partner(s)   Other Topics Concern  . Not on file   Social History Narrative   Married 12/2011, has a cockapoo   Current outpatient prescriptions:cetirizine (ZYRTEC) 10 MG tablet, Take 10 mg by mouth daily., Disp: , Rfl: ;  dexlansoprazole (DEXILANT) 60 MG capsule, Take 1 capsule (60 mg total) by mouth daily., Disp: 10 capsule, Rfl: 0;  Multiple Vitamin (MULTIVITAMINS PO), Take by mouth daily.  , Disp: , Rfl: ;  OMEGA 3 1000 MG CAPS, Take 1,000-2,000 mg by mouth daily. , Disp: , Rfl:  Probiotic Product (PHILLIPS COLON HEALTH PO), Take by mouth., Disp: , Rfl: ;  Digestive Enzymes (PAPAYA AND ENZYMES PO), Take by mouth., Disp: , Rfl: ;  ibuprofen (ADVIL,MOTRIN) 200 MG tablet, Take 200-400 mg by mouth as needed., Disp: , Rfl: ;  simethicone (MYLICON) 125 MG chewable tablet, Chew 125 mg by mouth every 6 (six) hours as needed., Disp: , Rfl:  [DISCONTINUED] ranitidine (RANITIDINE 75) 75 MG tablet, Take 75 mg by mouth 2 (two) times daily.  , Disp: , Rfl:   No Known Allergies  ROS:  Denies fevers, chest pain,  shortness of breath, palpitations, dysphagia, vomiting, change in bowels.  +anxiety, no depression.  No URI symptoms, rashes, bleeding/bruising or other concerns except per HPI.  PHYSICAL EXAM: BP 130/90  Pulse 72  Ht 5\' 9"  (1.753 m)  Wt 170 lb (77.111 kg)  BMI 25.09 kg/m2 Pleasant, anxious male in no distress Neck: no lymphadenopathy Heart: regular rate and rhythm Lungs: clear bilaterally Abdomen: mild tenderness of xiphoid process.  Abdomen soft, nontender, no organomegaly or mass Extremities: no edema  ASSESSMENT/PLAN:  GERD (gastroesophageal reflux disease)  Anxiety state, unspecified  GERD  We discussed in detail that his GERD appears to be significantly improved on the Dexilant. He has some recurrent nausea when stressed, which is  likely related to anxiety +/- IBS.  He also has a musculoskeletal component to his discomfort.  He was reassured that endoscopy is not indicated at this point.  Wife agrees that anxiety is contributing.  We discussed counseling.  Consider medications, and return for further discussion if indicated.  25 min visit, more than 1/2 spent counseling. We discussed symptoms of concern, and to return if these develop--increased abdominal pain, symptoms of PUD, weight loss, dysphagia.

## 2013-02-08 NOTE — Patient Instructions (Addendum)
Continue the Dexilant. I think the symptoms you described are from various reasons--including anxiety, irritable bowel, and some musculoskeletal component. Continue the proper diet.   Return if you have difficulty swallowing, bloody or black bowel movements, worsening nausea, vomiting coffee ground looking vomit  I encourage seeking treatment for anxiety.  I would start with counseling Jordan Stafford is who I mentioned, but you can see anyone you prefer, check with insurance).  Return if not improving to discuss medications for anxiety.

## 2013-03-09 ENCOUNTER — Other Ambulatory Visit: Payer: Self-pay | Admitting: Family Medicine

## 2013-06-01 ENCOUNTER — Other Ambulatory Visit: Payer: Self-pay

## 2013-06-12 ENCOUNTER — Ambulatory Visit (INDEPENDENT_AMBULATORY_CARE_PROVIDER_SITE_OTHER): Payer: BC Managed Care – PPO | Admitting: Medical

## 2013-06-12 ENCOUNTER — Encounter: Payer: Self-pay | Admitting: Medical

## 2013-06-12 VITALS — BP 130/80 | HR 88 | Temp 99.4°F | Resp 18 | Wt 178.0 lb

## 2013-06-12 DIAGNOSIS — J111 Influenza due to unidentified influenza virus with other respiratory manifestations: Secondary | ICD-10-CM

## 2013-06-12 DIAGNOSIS — R05 Cough: Secondary | ICD-10-CM

## 2013-06-12 DIAGNOSIS — R059 Cough, unspecified: Secondary | ICD-10-CM

## 2013-06-12 DIAGNOSIS — R509 Fever, unspecified: Secondary | ICD-10-CM

## 2013-06-12 LAB — POCT INFLUENZA A/B: Influenza A, POC: POSITIVE

## 2013-06-12 MED ORDER — OSELTAMIVIR PHOSPHATE 75 MG PO CAPS: 75.0000 mg | ORAL_CAPSULE | Freq: Two times a day (BID) | ORAL | Status: DC

## 2013-06-12 NOTE — Progress Notes (Signed)
Subjective: Here for 1 day hx/o fever, body aches and cough.  He reports dry cough, post nasal drip, but then this morning awoke with body aches.  Took advil this morning, went to work.  Came home took nap, then checked his temp and had fever of 102.  Then went to parents house, got some home made cough syrup of whisky and sugar.  currently he notes aches, cough, hot/cold, feverish, some mild nasal congestion.  Denies night sweats, NVD, abdominal pain, sore throat, ear pain.   Nonsmoker.   Coworker called in today with strep.  No other aggravating or relieving factors. No other c/o.   Past Medical History  Diagnosis Date  . Supraventricular tachycardia     Dr Graciela Husbands  . Anxiety   . GERD (gastroesophageal reflux disease)   . IBS (irritable bowel syndrome)   . Seasonal allergies   . Renal stone   . Acne    ROS as in subjective  Objective: Filed Vitals:   06/12/13 1549  BP: 130/80  Pulse: 88  Temp: 99.4 F (37.4 C)  Resp: 18    General appearance: alert, no distress, WD/WN, ill appearing HEENT: normocephalic, sclerae anicteric, left TM mildly erythematous, right TM pearly, nares with slight clear discharge, pharynx normal Oral cavity: MMM, no lesions Neck: supple, shoddy tender nodes anteriorly, no thyromegaly, no masses Heart: RRR, normal S1, S2, no murmurs Lungs: CTA bilaterally, no wheezes, rhonchi, or rales Pulses: 2+ symmetric, upper and lower extremities, normal cap refill    Assessment: Encounter Diagnoses  Name Primary?  . Influenza with other respiratory manifestations Yes  . Fever, unspecified   . Cough     Discussed diagnosis of influenza.  prescription given for Tamiflu, discussed risks/benefits of medication.  Discussed supportive care including rest, hydration, OTC Tylenol or NSAID for fever, aches, and malaise.  Discussed period of contagion, self quarantine at home away from others to avoid spread of disease, discussed means of transmission, and possible  complications including pneumonia.  If worse or not improving within the next 4-5 days, then call or return.  Gave note for work.

## 2013-06-16 ENCOUNTER — Encounter: Payer: Self-pay | Admitting: Medical

## 2013-06-29 ENCOUNTER — Ambulatory Visit (INDEPENDENT_AMBULATORY_CARE_PROVIDER_SITE_OTHER): Payer: BC Managed Care – PPO | Admitting: Family Medicine

## 2013-06-29 ENCOUNTER — Encounter: Payer: Self-pay | Admitting: Family Medicine

## 2013-06-29 VITALS — BP 122/90 | HR 84 | Temp 98.2°F | Ht 69.0 in | Wt 172.0 lb

## 2013-06-29 DIAGNOSIS — R059 Cough, unspecified: Secondary | ICD-10-CM

## 2013-06-29 DIAGNOSIS — R05 Cough: Secondary | ICD-10-CM

## 2013-06-29 DIAGNOSIS — J069 Acute upper respiratory infection, unspecified: Secondary | ICD-10-CM

## 2013-06-29 MED ORDER — BENZONATATE 200 MG PO CAPS: 200.0000 mg | ORAL_CAPSULE | Freq: Three times a day (TID) | ORAL | Status: DC | PRN

## 2013-06-29 NOTE — Progress Notes (Signed)
Chief Complaint  Patient presents with  . Cough    had flu did get better, cough came back about a week ago and is intermittent, mucus is clear. No fever, does state he has some nasal congestion.    He feels like he got completely better after influenza, but recurrent coughing over the last week, getting worse.  Cough is dry, and hacky, but sometimes will get up clear-white mucus.  Denies fevers.  Denies any shortness of breath.  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  He is using nighttime Robitussin DM with some relief, also sleeping with HOB elevated. Not using any cough meds during the day, just sugar-free candies.  He feels like there is postnasal drainage. Isn't having any runny nose. Started having some pressure between and behind his eyes today  Past Medical History  Diagnosis Date  . Supraventricular tachycardia     Dr Graciela Husbands  . Anxiety   . GERD (gastroesophageal reflux disease)   . IBS (irritable bowel syndrome)   . Seasonal allergies   . Renal stone   . Acne    Past Surgical History  Procedure Laterality Date  . Mouth surgery      x3  . Toenail excision      ingrown nail   History   Social History  . Marital Status: Married    Spouse Name: N/A    Number of Children: N/A  . 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 Smoking status: Never Smoker   . Smokeless tobacco: Never Used  . Alcohol Use: No  . Drug Use: No  . Sexual Activity: Yes    Partners: Female   Other Topics Concern  . Not on file   Social History Narrative   Married 12/2011, has a cockapoo    Current outpatient prescriptions:cetirizine (ZYRTEC) 10 MG tablet, Take 10 mg by mouth daily., Disp: , Rfl: ;  DEXILANT 60 MG capsule, Take one capsule by mouth one time daily, Disp: 30 capsule, Rfl: 5;  ibuprofen  (ADVIL,MOTRIN) 200 MG tablet, Take 200-400 mg by mouth as needed., Disp: , Rfl: ;  OMEGA 3 1000 MG CAPS, Take 1,000-2,000 mg by mouth daily. , Disp: , Rfl: ;  Probiotic Product (PHILLIPS COLON HEALTH PO), Take by mouth., Disp: , Rfl:  simethicone (MYLICON) 125 MG chewable tablet, Chew 125 mg by mouth every 6 (six) hours as needed., Disp: , Rfl: ;  Digestive Enzymes (PAPAYA AND ENZYMES PO), Take by mouth., Disp: , Rfl: ;  Multiple Vitamin (MULTIVITAMINS PO), Take by mouth daily.  , Disp: , Rfl: ;  [DISCONTINUED] ranitidine (RANITIDINE 75) 75 MG tablet, Take 75 mg by mouth 2 (two) times daily.  , Disp: , Rfl:   No Known Allergies  ROS:  Denies nausea, vomiting, diarrhea.  Some muscle tension headaches (posteriorly); sinus pressure started today. No chest pain, palpitations, skin rashes, bleeding or bruising.  PHYSICAL EXAM: BP 122/90  Pulse 84  Temp(Src) 98.2 F (36.8 C) (Oral)  Ht 5\' 9"  (1.753 m)  Wt 172 lb (78.019 kg)  BMI 25.39 kg/m2  Well developed, pleasant male, with intermittent dry cough, throat-clearing HEENT:  PERRL, EOMI, conjunctiva clear.  Nasal mucosa is moderately edematous with yellow crusting and clear mucus bilaterally. TM's and EAC's normal.  Sinuses nontender. OP clear Neck:  no lymphadenopathy or mass Heart: regular rate and rhythm without murmur Lungs: clear bilaterally.  No wheezes, rales or ronchi.  No wheezes with forced expiration Skin: no rash Psych: normal mood, affect, hygiene and grooming Neuro: alert and oriented, cranial nerves intact, normal gait  ASSESSMENT/PLAN:  Cough - Plan: benzonatate (TESSALON) 200 MG capsule  Acute upper respiratory infections of unspecified site  Drink plenty of fluids Use Mucinex (guaifenesin) to keep the mucus thin. Continue zyrtec daily. Use sinus rinses once or twice daily. Use Tessalon perles if needed for cough suppression (can use during the day, shouldn't make you sleepy).  Return if you develop fevers, shortness of  breath, pain with breathing, or other new concerns.

## 2013-06-29 NOTE — Patient Instructions (Signed)
Drink plenty of fluids Use Mucinex (guaifenesin) to keep the mucus thin. Continue zyrtec daily. Use sinus rinses once or twice daily. Use Tessalon perles if needed for cough suppression (can use during the day, shouldn't make you sleepy).  Return if you develop fevers, shortness of breath, pain with breathing, or other new concerns.

## 2013-08-24 ENCOUNTER — Encounter: Payer: Self-pay | Admitting: Family Medicine

## 2013-08-24 ENCOUNTER — Ambulatory Visit (INDEPENDENT_AMBULATORY_CARE_PROVIDER_SITE_OTHER): Payer: BC Managed Care – PPO | Admitting: Family Medicine

## 2013-08-24 VITALS — BP 122/90 | HR 88 | Ht 69.0 in | Wt 169.0 lb

## 2013-08-24 DIAGNOSIS — R079 Chest pain, unspecified: Secondary | ICD-10-CM

## 2013-08-24 DIAGNOSIS — R0609 Other forms of dyspnea: Secondary | ICD-10-CM

## 2013-08-24 DIAGNOSIS — R0989 Other specified symptoms and signs involving the circulatory and respiratory systems: Secondary | ICD-10-CM

## 2013-08-24 DIAGNOSIS — K219 Gastro-esophageal reflux disease without esophagitis: Secondary | ICD-10-CM

## 2013-08-24 NOTE — Patient Instructions (Signed)
We are referring you to cardiologist for evaluation of your exertional symptoms.  Lay low and avoid the treadmill until then. You have a component of musculoskeletal pain--use anti-inflammatories (aleve or ibuprofen) regularly for a week, as well as warm compresses.  Continue your Dexilant and reflux precautions/diet  Costochondritis Costochondritis, sometimes called Tietze syndrome, is a swelling and irritation (inflammation) of the tissue (cartilage) that connects your ribs with your breastbone (sternum). It causes pain in the chest and rib area. Costochondritis usually goes away on its own over time. It can take up to 6 weeks or longer to get better, especially if you are unable to limit your activities. CAUSES  Some cases of costochondritis have no known cause. Possible causes include:  Injury (trauma).  Exercise or activity such as lifting.  Severe coughing. SIGNS AND SYMPTOMS  Pain and tenderness in the chest and rib area.  Pain that gets worse when coughing or taking deep breaths.  Pain that gets worse with specific movements. DIAGNOSIS  Your health care provider will do a physical exam and ask about your symptoms. Chest X-rays or other tests may be done to rule out other problems. TREATMENT  Costochondritis usually goes away on its own over time. Your health care provider may prescribe medicine to help relieve pain. HOME CARE INSTRUCTIONS   Avoid exhausting physical activity. Try not to strain your ribs during normal activity. This would include any activities using chest, abdominal, and side muscles, especially if heavy weights are used.  Apply ice to the affected area for the first 2 days after the pain begins.  Put ice in a plastic bag.  Place a towel between your skin and the bag.  Leave the ice on for 20 minutes, 2 3 times a day.  Only take over-the-counter or prescription medicines as directed by your health care provider. SEEK MEDICAL CARE IF:  You have redness  or swelling at the rib joints. These are signs of infection.  Your pain does not go away despite rest or medicine. SEEK IMMEDIATE MEDICAL CARE IF:   Your pain increases or you are very uncomfortable.  You have shortness of breath or difficulty breathing.  You cough up blood.  You have worse chest pains, sweating, or vomiting.  You have a fever or persistent symptoms for more than 2 3 days.  You have a fever and your symptoms suddenly get worse. MAKE SURE YOU:   Understand these instructions.  Will watch your condition.  Will get help right away if you are not doing well or get worse. Document Released: 04/22/2005 Document Revised: 05/03/2013 Document Reviewed: 02/14/2013 [facility] Patient Information 2014 [place].

## 2013-08-24 NOTE — Progress Notes (Signed)
Chief Complaint  Patient presents with  . Gastrophageal Reflux    has been experiencing GERD while on the treadmill and this preventing him from working out.    Patient presents accompanied by his wife with complaints of exertional chest complaints, and dyspnea.  12/23 he joined the gym.  He was initially okay while on the treadmill, but after getting off he had a little off-balance, but felt like the muscles in his chest were tight, and it was harder to get air.  This would last 5-10 minutes, resolved with rest.  He then started getting a queasy feeling on his stomach while on the treadmill the last few times.  He is also belching during after the workout on the treadmill. Continues to feel that trouble getting in air after he finishes.  He had to get off the treadmill after 15 minutes, due to these symptoms.  He has been waiting about an hour and a half after eating breakfast before exercising.  He tried taking Tums prior to exercise--one day might have helped, no help on other times.  He has had problems with similar symptoms with 3 flights of stairs at school (intermittently, usually more in the morning)--just out of breath, but no queasiness or belching.  Feels like muscles are tight in his ribcage, sometimes right sided, sometimes left sided.   Sore to touch across his chest  His reflux symptoms are usually that he feels a little off balance and nausea/queasiness (similar to feeling he is getting on treadmill).  He has had a stress test back in 2008, as part of his SVT evaluation.  He was on meds for a while, tapered off and hasn't had any recurrent problems.   Past Medical History  Diagnosis Date  . Supraventricular tachycardia     Dr Caryl Comes  . Anxiety   . GERD (gastroesophageal reflux disease)   . IBS (irritable bowel syndrome)   . Seasonal allergies   . Renal stone   . Acne    Past Surgical History  Procedure Laterality Date  . Mouth surgery      x3  . Toenail excision     ingrown nail   History   Social History  . Marital Status: Married    Spouse Name: N/A    Number of Children: N/A  . Years of Education: N/A   Occupational History  . professor of [employer] History Main Topics  . Smoking status: Never Smoker   . Smokeless tobacco: Never Used  . Alcohol Use: No  . Drug Use: No  . Sexual Activity: Yes    Partners: Female   Other Topics Concern  . Not on file   Social History Narrative   Married 12/2011, has a cockapoo   Family History  Problem Relation Age of Onset  . Coronary artery disease Neg Hx   . [place] White syndrome Mother   . Hypertension Mother   . COPD Mother     smoker  . Diabetes Father     borderline  . Hypertension Father   . Heart disease Maternal Grandmother     MI at 71  . Cancer Paternal Grandfather     lung  . Heart disease Paternal Grandmother     late 60's   Outpatient Encounter Prescriptions as of 08/24/2013  Medication Sig  . calcium carbonate (TUMS - DOSED IN MG ELEMENTAL CALCIUM) 500 MG chewable tablet Chew 2 tablets by mouth as needed for indigestion  or heartburn.  . cetirizine (ZYRTEC) 10 MG tablet Take 10 mg by mouth daily.  Marland Kitchen DEXILANT 60 MG capsule Take one capsule by mouth one time daily  . Digestive Enzymes (PAPAYA AND ENZYMES PO) Take by mouth.  . Multiple Vitamin (MULTIVITAMINS PO) Take by mouth daily.    . OMEGA 3 1000 MG CAPS Take 1,000-2,000 mg by mouth daily.   . Probiotic Product ([place]) Take by mouth.  . simethicone (MYLICON) 947 MG chewable tablet Chew 125 mg by mouth every 6 (six) hours as needed.  Marland Kitchen ibuprofen (ADVIL,MOTRIN) 200 MG tablet Take 200-400 mg by mouth as needed.  . [DISCONTINUED] benzonatate (TESSALON) 200 MG capsule Take 1 capsule (200 mg total) by mouth 3 (three) times daily as needed for cough.   No Known Allergies  ROS:  Denies fevers, chills, URI symptom, cough, edema, headache.  +mild off balance/dizziness with  exertion. No palpitations or tachycardia.  +GI complaints as per HPI.  Denies vomiting, bowel changes.  Denies joint pains.    PHYSICAL EXAM: BP 122/90  Pulse 88  Ht 5\' 9"  (1.753 m)  Wt 169 lb (76.658 kg)  BMI 24.95 kg/m2 Well developed, well-appearing male, speaking easily in full sentences, in no distress.  Doesn't appear particularly anxious today. Neck: no lymphadenopathy, thyromegaly or carotid bruit Heart: regular rate and rhythm without murmur Lungs: clear bilaterally Chest: tender at bilateral costochondral junctions at level of nipples. nontender in central portion of sternum Abdomen: tender in epigastrium, otherwise nontender.  Normal bowel sounds, no organomegaly or mass Psych: normal mood, affect Extremities: no edema Neuro: alert and oriented, cranial nerves intact. Normal strength, gait  EKG:  NSR, rate 84.  Possible LAE. No acute abnl  ASSESSMENT/PLAN:  Chest pain - r/o coronary disease; has component of costochondritis.  Warm compresses and NSAID's.  refer to cardiology. - Plan: Ambulatory referral to Cardiology, EKG 12-Lead  Exertional dyspnea - Plan: Ambulatory referral to Cardiology, EKG 12-Lead  GERD (gastroesophageal reflux disease) - Plan: EKG 12-Lead  DOE (dyspnea on exertion)

## 2013-08-29 ENCOUNTER — Ambulatory Visit (INDEPENDENT_AMBULATORY_CARE_PROVIDER_SITE_OTHER): Payer: BC Managed Care – PPO | Admitting: Physician Assistant

## 2013-08-29 ENCOUNTER — Encounter: Payer: Self-pay | Admitting: Physician Assistant

## 2013-08-29 VITALS — BP 131/83 | HR 86 | Ht 69.0 in | Wt 169.0 lb

## 2013-08-29 DIAGNOSIS — R079 Chest pain, unspecified: Secondary | ICD-10-CM

## 2013-08-29 DIAGNOSIS — I471 Supraventricular tachycardia: Secondary | ICD-10-CM

## 2013-08-29 NOTE — Progress Notes (Signed)
[address] [place], [place]  [address] Phone: [phone] Fax:  [phone]  Date:  08/29/2013   ID:  Jordan Stafford, DOB 1971/05/11, MRN [id number]  PCP:  Wyatt Haste, MD  Cardiologist:  Dr. Virl Axe     History of Present Illness: Jordan Stafford is a 43 y.o. male with a hx of SVT treated medically.  Last seen by Dr. Virl Axe 11/2011.  He was recently seen by his PCP for chest pain and referred back for evaluation.  He recently tried to resume exercising. Since starting, he has noted decreased exercise tolerance as well as chest discomfort. The discomfort is difficult to describe. It is not a heaviness or pressure. It is not a sharp pain. It will often last until he stops exercising. It is concerning for him. He has felt some axillary pain. However, he can reproduce this with positional changes. He has noted some nausea at times. He denies syncope. He has noted symptoms at rest before. He has been able to prevent symptoms with taking TUMS prior to exercise. He has noted some relief with eating meals in the past. He sleeps on an incline due to GERD. He denies PND or edema. He denies syncope.  Recent Labs: 10/05/2012: ALT 39; Creatinine 0.99; Hemoglobin 15.5; Potassium 4.0; TSH 2.109   Wt Readings from Last 3 Encounters:  08/29/13 169 lb (76.658 kg)  08/24/13 169 lb (76.658 kg)  06/29/13 172 lb (78.019 kg)     Past Medical History  Diagnosis Date  . Supraventricular tachycardia     Dr Caryl Comes  . Anxiety   . GERD (gastroesophageal reflux disease)   . IBS (irritable bowel syndrome)   . Seasonal allergies   . Renal stone   . Acne     Current Outpatient Prescriptions  Medication Sig Dispense Refill  . calcium carbonate (TUMS - DOSED IN MG ELEMENTAL CALCIUM) 500 MG chewable tablet Chew 2 tablets by mouth as needed for indigestion or heartburn.      . cetirizine (ZYRTEC) 10 MG tablet Take 10 mg by mouth daily.      Marland Kitchen DEXILANT 60 MG capsule Take one capsule  by mouth one time daily  30 capsule  5  . Digestive Enzymes (PAPAYA AND ENZYMES PO) Take by mouth.      Marland Kitchen ibuprofen (ADVIL,MOTRIN) 200 MG tablet Take 200-400 mg by mouth as needed.      . Multiple Vitamin (MULTIVITAMINS PO) Take by mouth daily.        . OMEGA 3 1000 MG CAPS Take 1,000-2,000 mg by mouth daily.       . Probiotic Product ([place]) Take by mouth.      . simethicone (MYLICON) 341 MG chewable tablet Chew 125 mg by mouth every 6 (six) hours as needed.      . [DISCONTINUED] ranitidine (RANITIDINE 75) 75 MG tablet Take 75 mg by mouth 2 (two) times daily.         No current facility-administered medications for this visit.    Allergies:   Review of patient's allergies indicates no known allergies.   Social History:  The patient  reports that he has never smoked. He has never used smokeless tobacco. He reports that he does not drink alcohol or use illicit drugs.   Family History:  The patient's family history includes COPD in his mother; Cancer in his paternal grandfather; Diabetes in his father; Heart disease in his maternal grandmother and paternal grandmother;  Hypertension in his father and mother; [place] Parkinson White syndrome in his mother. There is no history of Coronary artery disease.   ROS:  Please see the history of present illness.   He had the flu back in 05/2013. He has had some dysphagia in the past. He denies melena or hematochezia. No weight loss.   All other systems reviewed and negative.   PHYSICAL EXAM: VS:  BP 131/83  Pulse 86  Ht 5\' 9"  (1.753 m)  Wt 169 lb (76.658 kg)  BMI 24.95 kg/m2 Well nourished, well developed, in no acute distress HEENT: normal Neck: no JVD Cardiac:  normal S1, S2; RRR; no murmur Lungs:  clear to auscultation bilaterally, no wheezing, rhonchi or rales Abd: soft, mild epigastric tenderness to palpation, no hepatomegaly Ext: no edema Skin: warm and dry Neuro:  CNs 2-12 intact, no focal abnormalities noted  EKG:   NSR, HR 86, normal axis, no acute changes     ASSESSMENT AND PLAN:  1. Chest Pain: He has typical and atypical features. I suspect he likely has worsening GERD symptoms contributing. However, there is an exertional component. I will arrange an ETT-Myoview. If this is low risk, consider referral to gastroenterology. 2. SVT:  He has stopped taking diltiazem. He has not had a recurrence of his symptoms. 3. Disposition:  Follow up with Dr. Caryl Comes in 11/2013 as planned or sooner if stress test is abnormal.  Signed, [name], PA-C  08/29/2013 8:41 AM

## 2013-08-29 NOTE — Patient Instructions (Addendum)
Your physician has requested that you have en exercise stress myoview. For further information please visit [email or web address]. Please follow instruction sheet, as given.  Your physician recommends that you schedule a follow-up appointment in: MAY 2015 [place]  Your physician recommends that you continue on your current medications as directed. Please refer to the Current Medication list given to you today.

## 2013-09-05 ENCOUNTER — Ambulatory Visit (HOSPITAL_COMMUNITY): Payer: BC Managed Care – PPO | Attending: Physician Assistant | Admitting: Radiology

## 2013-09-05 VITALS — BP 140/98 | Ht 69.0 in | Wt 167.0 lb

## 2013-09-05 DIAGNOSIS — R42 Dizziness and giddiness: Secondary | ICD-10-CM | POA: Insufficient documentation

## 2013-09-05 DIAGNOSIS — R0609 Other forms of dyspnea: Secondary | ICD-10-CM | POA: Insufficient documentation

## 2013-09-05 DIAGNOSIS — R0602 Shortness of breath: Secondary | ICD-10-CM

## 2013-09-05 DIAGNOSIS — R079 Chest pain, unspecified: Secondary | ICD-10-CM | POA: Insufficient documentation

## 2013-09-05 DIAGNOSIS — R0989 Other specified symptoms and signs involving the circulatory and respiratory systems: Secondary | ICD-10-CM | POA: Insufficient documentation

## 2013-09-05 MED ORDER — TECHNETIUM TC 99M SESTAMIBI GENERIC - CARDIOLITE
10.0000 | Freq: Once | INTRAVENOUS | Status: AC | PRN
  Administered 2013-09-05: 10 via INTRAVENOUS

## 2013-09-05 MED ORDER — TECHNETIUM TC 99M SESTAMIBI GENERIC - CARDIOLITE
30.0000 | Freq: Once | INTRAVENOUS | Status: AC | PRN
  Administered 2013-09-05: 30 via INTRAVENOUS

## 2013-09-05 NOTE — Progress Notes (Signed)
[facility] [address] [place], [place] [address] [phone]    Cardiology Nuclear Med Study  Jordan Stafford is a 43 y.o. male     MRN : [id number]     DOB: 02-04-1971  Procedure Date: 09/05/2013  Nuclear Med Background Indication for Stress Test:  Evaluation for Ischemia History:No Known History of CAD; Previous Nuclear Study 08'(cone) Cardiac Risk Factors: none  Symptoms:  Chest Pain, Dizziness and DOE   Nuclear Pre-Procedure Caffeine/Decaff Intake:  None NPO After: 8:00pm   Lungs:  clear O2 Sat: 96% on room air. IV 0.9% NS with Angio Cath:  22g  IV Site: R Forearm  IV Started by:  Matilde Haymaker, RN  Chest Size (in):  40 Cup Size: n/a  Height: 5\' 9"  (1.753 m)  Weight:  167 lb (75.751 kg)  BMI:  Body mass index is 24.65 kg/(m^2). Tech Comments:  n/a    Nuclear Med Study 1 or 2 day study: 1 day  Stress Test Type:  Stress  Reading MD: n/a  Order Authorizing Provider:  Herbert [facility]  Resting Radionuclide: Technetium 73m Sestamibi  Resting Radionuclide Dose: 11.0 mCi   Stress Radionuclide:  Technetium 60m Sestamibi  Stress Radionuclide Dose: 33.0 mCi           Stress Protocol Rest HR: 96 Stress HR: 151  Rest BP: 140/98 Stress BP: 164/87  Exercise Time (min): 6:31 METS: 7.80   Predicted Max HR: 177 bpm % Max HR: 85.31 bpm Rate Pressure Product: 93818   Dose of Adenosine (mg):  n/a Dose of Lexiscan: n/a mg  Dose of Atropine (mg): n/a Dose of Dobutamine: n/a mcg/kg/min (at max HR)  Stress Test Technologist: Ileene Hutchinson, EMT-P  Nuclear Technologist:  Charlton Amor, CNMT     Rest Procedure:  Myocardial perfusion imaging was performed at rest 45 minutes following the intravenous administration of Technetium 9m Sestamibi. Rest ECG: NSR - Normal EKG  Stress Procedure:  The patient exercised on the treadmill utilizing the Bruce Protocol for 6:31 minutes. The patient stopped due to sob/leg fatigue and felt  a "strain to his muscle of the chest wall ,that resolved within seconds of finishing the treadmill .  Technetium 5m Sestamibi was injected at peak exercise and myocardial perfusion imaging was performed after a brief delay. Stress ECG: No significant change from baseline ECG  QPS Raw Data Images:  Normal; no motion artifact; normal heart/lung ratio. Stress Images:  Normal homogeneous uptake in all areas of the myocardium. Rest Images:  Normal homogeneous uptake in all areas of the myocardium. Subtraction (SDS):  Normal Transient Ischemic Dilatation (Normal <1.22):  0.84 Lung/Heart Ratio (Normal <0.45):  0.26  Quantitative Gated Spect Images QGS EDV:  91 ml QGS ESV:  30 ml  Impression Exercise Capacity:  Fair exercise capacity. BP Response:  Normal blood pressure response. Clinical Symptoms:  There is dyspnea. ECG Impression:  No significant ST segment change suggestive of ischemia. Comparison with Prior Nuclear Study: No images to compare  Overall Impression:  Normal stress nuclear study.  LV Ejection Fraction: 67%.  LV Wall Motion:  NL LV Function; NL Wall Motion   [name]

## 2013-09-06 ENCOUNTER — Telehealth: Payer: Self-pay | Admitting: *Deleted

## 2013-09-06 ENCOUNTER — Encounter: Payer: Self-pay | Admitting: Physician Assistant

## 2013-09-06 NOTE — Telephone Encounter (Signed)
pt notified about normal myoview and I scheduled his f/u w/Dr. Caryl Comes 12/19/13 @ 3:30 as well. Pt verbalized understanding to results and appt.

## 2013-09-28 ENCOUNTER — Other Ambulatory Visit: Payer: Self-pay | Admitting: Family Medicine

## 2013-12-19 ENCOUNTER — Ambulatory Visit: Payer: BC Managed Care – PPO | Admitting: Internal Medicine

## 2014-01-25 ENCOUNTER — Ambulatory Visit (INDEPENDENT_AMBULATORY_CARE_PROVIDER_SITE_OTHER): Payer: BC Managed Care – PPO | Admitting: Internal Medicine

## 2014-01-25 ENCOUNTER — Encounter: Payer: Self-pay | Admitting: Internal Medicine

## 2014-01-25 VITALS — BP 146/93 | HR 99 | Ht 68.0 in | Wt 177.0 lb

## 2014-01-25 DIAGNOSIS — I471 Supraventricular tachycardia: Secondary | ICD-10-CM

## 2014-01-25 NOTE — Progress Notes (Signed)
      Patient Care Team: Denita Lung, MD as PCP - General (Family Medicine)   HPI  Jordan Stafford is a 43 y.o. male seen in followup for SVT. He elected to undertake medical  therapy and he had been on metoprolol tartrate 25 mg twice daily for long time without clinical recurrences.  We switched him to diltiazem; he has had no recurrences on it either. He has had a very stressful year her medically with a cough attributed to allergies and infection in the winter associated with broken ribs and severe reflux.  He has had no recurrrent tachypalps   Past Medical History  Diagnosis Date  . Supraventricular tachycardia     Dr Caryl Comes  . Anxiety   . GERD (gastroesophageal reflux disease)   . IBS (irritable bowel syndrome)   . Seasonal allergies   . Renal stone   . Acne   . Hx of cardiovascular stress test     ETT-Myoview (08/2013):  No ischemia, EF 67%; normal study.    Past Surgical History  Procedure Laterality Date  . Mouth surgery      x3  . Toenail excision      ingrown nail    Current Outpatient Prescriptions  Medication Sig Dispense Refill  . calcium carbonate (TUMS - DOSED IN MG ELEMENTAL CALCIUM) 500 MG chewable tablet Chew 2 tablets by mouth as needed for indigestion or heartburn.      . cetirizine (ZYRTEC) 10 MG tablet Take 10 mg by mouth daily.      Jordan Kitchen DEXILANT 60 MG capsule TAKE ONE CAPSULE BY MOUTH ONE TIME DAILY   30 capsule  4  . Digestive Enzymes (PAPAYA AND ENZYMES PO) Take by mouth.      Jordan Kitchen ibuprofen (ADVIL,MOTRIN) 200 MG tablet Take 200-400 mg by mouth as needed.      . Multiple Vitamin (MULTIVITAMINS PO) Take by mouth daily.        . OMEGA 3 1000 MG CAPS Take 1,000-2,000 mg by mouth daily.       . Probiotic Product ([place]) Take by mouth.      . simethicone (MYLICON) 161 MG chewable tablet Chew 125 mg by mouth every 6 (six) hours as needed.      . [DISCONTINUED] ranitidine (RANITIDINE 75) 75 MG tablet Take 75 mg by mouth 2 (two) times  daily.         No current facility-administered medications for this visit.    No Known Allergies  Review of Systems negative except from HPI and PMH  Physical Exam BP 146/93  Pulse 99  Ht 5\' 8"  (1.727 m)  Wt 177 lb (80.287 kg)  BMI 26.92 kg/m2 Well developed and nourished in no acute distress HENT normal Neck supple with JVP-flat Clear Regular rate and rhythm, no murmurs or gallops Abd-soft with active BS No Clubbing cyanosis edema Skin-warm and dry A & Oriented  Grossly normal sensory and motor function    Assessment and  Plan  SVT  Elevated blood pressure  Will continue him off dilt at this point  Will ask him to followup re his BP

## 2014-01-25 NOTE — Patient Instructions (Signed)
Your physician recommends that you continue on your current medications as directed. Please refer to the Current Medication list given to you today.  Your physician wants you to follow-up in: 2 years with Dr. Klein.  You will receive a reminder letter in the mail two months in advance. If you don't receive a letter, please call our office to schedule the follow-up appointment.  

## 2014-01-29 ENCOUNTER — Telehealth: Payer: Self-pay | Admitting: Family Medicine

## 2014-01-29 MED ORDER — DEXLANSOPRAZOLE 60 MG PO CPDR: DELAYED_RELEASE_CAPSULE | ORAL | Status: DC

## 2014-01-29 NOTE — Telephone Encounter (Signed)
Pt only has 2 days left of Dexilant, can pt have samples to hold for P.A.?

## 2014-01-29 NOTE — Telephone Encounter (Signed)
#  10 samples given.

## 2014-01-30 NOTE — Telephone Encounter (Signed)
Left message for pt that P.A. Approved Dexilant til 01/29/15

## 2014-02-25 ENCOUNTER — Other Ambulatory Visit: Payer: Self-pay | Admitting: Family Medicine

## 2014-03-27 ENCOUNTER — Telehealth: Payer: Self-pay | Admitting: Family Medicine

## 2014-03-27 MED ORDER — DEXLANSOPRAZOLE 60 MG PO CPDR: DELAYED_RELEASE_CAPSULE | ORAL | Status: DC

## 2014-03-27 NOTE — Telephone Encounter (Signed)
Chart reviewed.  Has CPE in 2 weeks.  Refilled #30.  Will address and give more refills at his CPE

## 2014-04-10 ENCOUNTER — Encounter: Payer: Self-pay | Admitting: Family Medicine

## 2014-04-10 ENCOUNTER — Ambulatory Visit (INDEPENDENT_AMBULATORY_CARE_PROVIDER_SITE_OTHER): Payer: BC Managed Care – PPO | Admitting: Family Medicine

## 2014-04-10 VITALS — BP 126/88 | HR 60 | Ht 69.0 in | Wt 174.0 lb

## 2014-04-10 DIAGNOSIS — Z23 Encounter for immunization: Secondary | ICD-10-CM

## 2014-04-10 DIAGNOSIS — Z0000 Encounter for general adult medical examination without abnormal findings: Secondary | ICD-10-CM

## 2014-04-10 DIAGNOSIS — R030 Elevated blood-pressure reading, without diagnosis of hypertension: Secondary | ICD-10-CM

## 2014-04-10 DIAGNOSIS — K219 Gastro-esophageal reflux disease without esophagitis: Secondary | ICD-10-CM

## 2014-04-10 DIAGNOSIS — E785 Hyperlipidemia, unspecified: Secondary | ICD-10-CM

## 2014-04-10 DIAGNOSIS — Z79899 Other long term (current) drug therapy: Secondary | ICD-10-CM

## 2014-04-10 LAB — POCT URINALYSIS DIPSTICK
BILIRUBIN UA: NEGATIVE
Blood, UA: NEGATIVE
Glucose, UA: NEGATIVE
KETONES UA: NEGATIVE
LEUKOCYTES UA: NEGATIVE
Nitrite, UA: NEGATIVE
PH UA: 6
Protein, UA: NEGATIVE
SPEC GRAV UA: 1.01
Urobilinogen, UA: NEGATIVE

## 2014-04-10 LAB — CBC WITH DIFFERENTIAL/PLATELET
BASOS ABS: 0 10*3/uL (ref 0.0–0.1)
BASOS PCT: 0 % (ref 0–1)
EOS ABS: 0.1 10*3/uL (ref 0.0–0.7)
Eosinophils Relative: 1 % (ref 0–5)
HCT: 43.3 % (ref 39.0–52.0)
HEMOGLOBIN: 15.4 g/dL (ref 13.0–17.0)
Lymphocytes Relative: 23 % (ref 12–46)
Lymphs Abs: 1.8 10*3/uL (ref 0.7–4.0)
MCH: 30.9 pg (ref 26.0–34.0)
MCHC: 35.6 g/dL (ref 30.0–36.0)
MCV: 86.9 fL (ref 78.0–100.0)
MONO ABS: 0.6 10*3/uL (ref 0.1–1.0)
MONOS PCT: 8 % (ref 3–12)
NEUTROS ABS: 5.2 10*3/uL (ref 1.7–7.7)
Neutrophils Relative %: 68 % (ref 43–77)
Platelets: 201 10*3/uL (ref 150–400)
RBC: 4.98 MIL/uL (ref 4.22–5.81)
RDW: 13.5 % (ref 11.5–15.5)
WBC: 7.7 10*3/uL (ref 4.0–10.5)

## 2014-04-10 MED ORDER — DEXLANSOPRAZOLE 60 MG PO CPDR: DELAYED_RELEASE_CAPSULE | ORAL | Status: DC

## 2014-04-10 NOTE — Progress Notes (Signed)
Chief Complaint  Patient presents with  . Annual Exam    fasting annual exam. Left middle finger swollen, red and infected x 3 days.    Jordan Stafford is a 43 y.o. male who presents for a complete physical.  He has the following concerns:  Left 3rd finger--has had pain for a few days, and noticed some redness and swelling today at the base of the fingernail.  Denies any known trauma.  No drainage.  No fevers.  Wife is pregnant--due in February.  H/o elevated BP's--116/72 yesterday at home. Always good at home, unless he is sick or "something is going on".  GERD: He takes Dexilant, and is very well controlled.  If eating poorly, he might have some breakthrough reflux, for which he takes Tums.  Denies dysphagia.  SVT--last saw cardiologist 01/2014. He is off diltiazem (and has been for almost 2 years).  Had normal stress test 08/2013.  He has had no further episodes of SVT since 2008.  The exertional shortness of breath and dizziness hasn't occurred with yardwork, but he hasn't been back to the gym (where symptoms first occurred).  Immunization History  Administered Date(s) Administered  . Influenza Split 05/27/2011  . Tdap 07/25/2008   flu shots yearly at school (usually--got it today) Last colonoscopy: never  Last PSA: never  Dentist: twice yearly  Ophtho: once yearly  Exercise: Zettie Pho and work around [employer].  Workload has doubled at work.  Walks the dog 3-4x/week.  2x/week he has classes that are 5-10 minutes apart.   Past Medical History  Diagnosis Date  . Supraventricular tachycardia     Dr Caryl Comes  . Anxiety   . GERD (gastroesophageal reflux disease)   . IBS (irritable bowel syndrome)   . Seasonal allergies   . Renal stone   . Acne   . Hx of cardiovascular stress test     ETT-Myoview (08/2013):  No ischemia, EF 67%; normal study.    Past Surgical History  Procedure Laterality Date  . Mouth surgery      x3  . Toenail excision      ingrown nail    History    Social History  . Marital Status: Married    Spouse Name: N/A    Number of Children: N/A  . Years of Education: N/A   Occupational History  . professor of [employer] History Main Topics  . Smoking status: Never Smoker   . Smokeless tobacco: Never Used  . Alcohol Use: No  . Drug Use: No  . Sexual Activity: Yes    Partners: Female   Other Topics Concern  . Not on file   Social History Narrative   Married 12/2011, has a cockapoo. Wife is pregnant, due 08/2014    Family History  Problem Relation Age of Onset  . Coronary artery disease Neg Hx   . [place] syndrome Mother   . Hypertension Mother   . COPD Mother     smoker  . Urolithiasis Mother   . Diabetes Father     borderline  . Hypertension Father   . Urolithiasis Father   . Heart disease Maternal Grandmother     MI at 54  . Cancer Paternal Grandfather     lung  . Heart disease Paternal Grandmother     late 68's   Outpatient Encounter Prescriptions as of 04/10/2014  Medication Sig Note  . cetirizine (ZYRTEC) 10 MG tablet Take 10 mg by  mouth daily.   Marland Kitchen dexlansoprazole (DEXILANT) 60 MG capsule TAKE ONE CAPSULE BY MOUTH ONE TIME DAILY   . Digestive Enzymes (PAPAYA AND ENZYMES PO) Take by mouth. 01/07/2012: Takes as needed, if upset stomach  . Multiple Vitamin (MULTIVITAMINS PO) Take by mouth daily.   04/10/2014: Remembers to take about 3x/week  . OMEGA 3 1000 MG CAPS Take 1,000-2,000 mg by mouth daily.    . Probiotic Product ([place]) Take by mouth.   . simethicone (MYLICON) 893 MG chewable tablet Chew 125 mg by mouth every 6 (six) hours as needed.   . [DISCONTINUED] dexlansoprazole (DEXILANT) 60 MG capsule TAKE ONE CAPSULE BY MOUTH ONE TIME DAILY   . calcium carbonate (TUMS - DOSED IN MG ELEMENTAL CALCIUM) 500 MG chewable tablet Chew 2 tablets by mouth as needed for indigestion or heartburn. 04/10/2014: Only rarely needs  . ibuprofen (ADVIL,MOTRIN) 200 MG  tablet Take 200-400 mg by mouth as needed.     No Known Allergies   ROS: The patient denies anorexia, fever, weight changes, headaches, vision loss, decreased hearing, ear pain, hoarseness, chest pain, dizziness, syncope, dyspnea on exertion, cough, swelling, nausea, vomiting, diarrhea, constipation, abdominal pain, melena, hematochezia, indigestion/heartburn, hematuria, incontinence, erectile dysfunction, nocturia, weakened urine stream, dysuria, genital lesions, joint pains, numbness, tingling, weakness, tremor, suspicious skin lesions, depression, abnormal bleeding/bruising, or enlarged lymph nodes  Occasional hand cramp. Some anxiety related to work stress. Denies insomnia. No recurrences of SVT Occasional sneeze, tension headache.  PHYSICAL EXAM:  BP 138/90  Pulse 60  Ht 5\' 9"  (1.753 m)  Wt 174 lb (78.926 kg)  BMI 25.68 kg/m2 126/88 on repeat by MD  General Appearance:  Alert, cooperative, no distress, appears stated age   Head:  Normocephalic, without obvious abnormality, atraumatic   Eyes:  PERRL, conjunctiva/corneas clear, EOM's intact, fundi  benign   Ears:  Normal TM's and external ear canals   Nose:  Nares normal, mucosa normal, no drainage or sinus tenderness   Throat:  Lips, mucosa, and tongue normal; teeth and gums normal   Neck:  Supple, no lymphadenopathy; thyroid: no enlargement/tenderness/nodules; no carotid  bruit or JVD   Back:  Spine nontender, no curvature, ROM normal, no CVA tenderness   Lungs:  Clear to auscultation bilaterally without wheezes, rales or ronchi; respirations unlabored   Chest Wall:  No tenderness or deformity   Heart:  Regular rate and rhythm, S1 and S2 normal, no murmur, rub  or gallop   Breast Exam:  No chest wall tenderness, masses or gynecomastia   Abdomen:  Soft, non-tender, nondistended, normoactive bowel sounds,  no masses, no hepatosplenomegaly   Genitalia:  Normal male external genitalia without lesions. Testicles without masses. No  inguinal hernias.   Rectal:  Normal sphincter tone, no masses or tenderness; guaiac negative stool. Prostate smooth, no nodules, not enlarged.   Extremities:  No clubbing, cyanosis or edema   Pulses:  2+ and symmetric all extremities   Skin:  Skin color, texture, turgor normal, no rashes or lesions. Mild erythema at base of left 3rd fingernail.  Small abrasion/cut.  No drainage   Lymph nodes:  Cervical, supraclavicular, and axillary nodes normal   Neurologic:  CNII-XII intact, normal strength, sensation and gait; reflexes 2+ and symmetric throughout          Psych: Normal mood, affect, hygiene and grooming.     ASSESSMENT/PLAN:  Routine general medical examination at a health care facility - Plan: Visual acuity screening, POCT Urinalysis Dipstick, Lipid panel,  Comprehensive metabolic panel, CBC with Differential, TSH  Need for prophylactic vaccination and inoculation against influenza - Plan: Flu Vaccine QUAD 36+ mos PF IM (Fluarix Quad PF)  ELEVATED BP READING WITHOUT DX HYPERTENSION - normal at home.  continue low sodium diet, try and incorporate regular exercise. continuing monitoring  Gastroesophageal reflux disease, esophagitis presence not specified - Plan: dexlansoprazole (DEXILANT) 60 MG capsule  Dyslipidemia - Plan: Lipid panel  Encounter for long-term (current) use of other medications - Plan: Comprehensive metabolic panel, CBC with Differential   Mild early paronychia--warm soaks and bacitracin. Reviewed s/sx of infection, and to return if they develop.  Recommended at least 30 minutes of aerobic activity at least 5 days/week; proper sunscreen use reviewed; healthy diet and alcohol recommendations (less than or equal to 2 drinks/day) reviewed; regular seatbelt use; changing batteries in smoke detectors. Self-testicular exams. Immunization recommendations discussed. Colonoscopy recommendations reviewed--age 80.

## 2014-04-10 NOTE — Patient Instructions (Addendum)
  HEALTH MAINTENANCE RECOMMENDATIONS:  It is recommended that you get at least 30 minutes of aerobic exercise at least 5 days/week (for weight loss, you may need as much as 60-90 minutes). This can be any activity that gets your heart rate up. This can be divided in 10-15 minute intervals if needed, but try and build up your endurance at least once a week.  Weight bearing exercise is also recommended twice weekly.  Eat a healthy diet with lots of vegetables, fruits and fiber.  "Colorful" foods have a lot of vitamins (ie green vegetables, tomatoes, red peppers, etc).  Limit sweet tea, regular sodas and alcoholic beverages, all of which has a lot of calories and sugar.  Up to 2 alcoholic drinks daily may be beneficial for men (unless trying to lose weight, watch sugars).  Drink a lot of water.  Sunscreen of at least SPF 30 should be used on all sun-exposed parts of the skin when outside between the hours of 10 am and 4 pm (not just when at beach or pool, but even with exercise, golf, tennis, and yard work!)  Use a sunscreen that says "broad spectrum" so it covers both UVA and UVB rays, and make sure to reapply every 1-2 hours.  Remember to change the batteries in your smoke detectors when changing your clock times in the spring and fall.  Use your seat belt every time you are in a car, and please drive safely and not be distracted with cell phones and texting while driving.  Small cut on the left 3rd finger--doesn't appear to be infected at this time. Do some warm soaks (2-3x/day) and apply antibacterial ointment such as bacitracin.  Return if increasing spread of redness, swelling, fever, drainage or worsening pain.

## 2014-04-11 DIAGNOSIS — Z23 Encounter for immunization: Secondary | ICD-10-CM

## 2014-04-11 LAB — COMPREHENSIVE METABOLIC PANEL
ALK PHOS: 63 U/L (ref 39–117)
ALT: 33 U/L (ref 0–53)
AST: 23 U/L (ref 0–37)
Albumin: 5 g/dL (ref 3.5–5.2)
BUN: 11 mg/dL (ref 6–23)
CO2: 27 mEq/L (ref 19–32)
Calcium: 9.6 mg/dL (ref 8.4–10.5)
Chloride: 102 mEq/L (ref 96–112)
Creat: 0.86 mg/dL (ref 0.50–1.35)
GLUCOSE: 82 mg/dL (ref 70–99)
Potassium: 3.9 mEq/L (ref 3.5–5.3)
Sodium: 138 mEq/L (ref 135–145)
Total Bilirubin: 0.8 mg/dL (ref 0.2–1.2)
Total Protein: 7.4 g/dL (ref 6.0–8.3)

## 2014-04-11 LAB — LIPID PANEL
Cholesterol: 222 mg/dL — ABNORMAL HIGH (ref 0–200)
HDL: 36 mg/dL — AB (ref >39)
LDL Cholesterol: 114 mg/dL — ABNORMAL HIGH (ref 0–99)
Total CHOL/HDL Ratio: 6.2 Ratio
Triglycerides: 361 mg/dL — ABNORMAL HIGH (ref <150)
VLDL: 72 mg/dL — ABNORMAL HIGH (ref 0–40)

## 2014-04-11 LAB — TSH: TSH: 1.598 u[IU]/mL (ref 0.350–4.500)

## 2014-04-26 ENCOUNTER — Other Ambulatory Visit: Payer: Self-pay | Admitting: *Deleted

## 2014-04-26 DIAGNOSIS — E785 Hyperlipidemia, unspecified: Secondary | ICD-10-CM

## 2014-06-12 ENCOUNTER — Other Ambulatory Visit: Payer: BC Managed Care – PPO

## 2014-07-12 ENCOUNTER — Other Ambulatory Visit: Payer: BC Managed Care – PPO

## 2014-08-14 ENCOUNTER — Telehealth: Payer: Self-pay | Admitting: Family Medicine

## 2014-08-14 NOTE — Telephone Encounter (Signed)
Pt had to cancel lab appt for Friday due to having to take pregnant wife to [facility] weekly for scans so can't really come until March and wanted to see if this is ok with you for him to wait until March to have his labs drawn.  He said he is taking the Fish oil daily per your instructions.

## 2014-08-14 NOTE — Telephone Encounter (Signed)
Pt informed and rescheduled lab appt

## 2014-08-14 NOTE — Telephone Encounter (Signed)
That's fine. Make sure that in addition to 3000-4000mg  of fish oil daily, that he is following a lowfat diet (avoiding sweets, candy, fried foods, etc).  Hoping all is going smoothly with the pregnancy!

## 2014-08-17 ENCOUNTER — Other Ambulatory Visit: Payer: BC Managed Care – PPO

## 2014-09-28 ENCOUNTER — Other Ambulatory Visit: Payer: BC Managed Care – PPO

## 2014-09-28 DIAGNOSIS — E785 Hyperlipidemia, unspecified: Secondary | ICD-10-CM

## 2014-09-29 LAB — LIPID PANEL
Cholesterol: 246 mg/dL — ABNORMAL HIGH (ref 0–200)
HDL: 33 mg/dL — ABNORMAL LOW (ref >=40)
Total CHOL/HDL Ratio: 7.5 Ratio
Triglycerides: 570 mg/dL — ABNORMAL HIGH (ref <150)

## 2014-10-03 ENCOUNTER — Ambulatory Visit (INDEPENDENT_AMBULATORY_CARE_PROVIDER_SITE_OTHER): Payer: BC Managed Care – PPO | Admitting: Family Medicine

## 2014-10-03 ENCOUNTER — Encounter: Payer: Self-pay | Admitting: Family Medicine

## 2014-10-03 VITALS — BP 128/90 | HR 72 | Ht 69.0 in | Wt 178.0 lb

## 2014-10-03 DIAGNOSIS — E781 Pure hyperglyceridemia: Secondary | ICD-10-CM

## 2014-10-03 MED ORDER — FENOFIBRATE 160 MG PO TABS: 160.0000 mg | ORAL_TABLET | Freq: Every day | ORAL | Status: DC

## 2014-10-03 NOTE — Progress Notes (Signed)
Chief Complaint  Patient presents with  . Follow-up    OV to discuss labs.    Patient presents to discuss his recent labs, high triglycerides.  His diet was reviewed: Chocolate Lucky Charms for breakfast and for snacks when wanting something sweet.   Not drinking regular sodas, sweet tea or alcohol.  Drinks a splenda-sweetened fruit punch (pitcher every 3 days). Special K pastry crisps for snacks also.  He is trying to follow a low cholesterol diet.  He brings in picture of label of his fish oil.  He is taking 2 capsules (considered 1 serving), 1600mg  daily. He hasn't been exercising regularly.  Has a newborn daughter.  PMH, PSH, SH was reviewed and updated. Current Outpatient Prescriptions on File Prior to Visit  Medication Sig Dispense Refill  . calcium carbonate (TUMS - DOSED IN MG ELEMENTAL CALCIUM) 500 MG chewable tablet Chew 2 tablets by mouth as needed for indigestion or heartburn.    . cetirizine (ZYRTEC) 10 MG tablet Take 10 mg by mouth daily.    Marland Kitchen dexlansoprazole (DEXILANT) 60 MG capsule TAKE ONE CAPSULE BY MOUTH ONE TIME DAILY 30 capsule 11  . Digestive Enzymes (PAPAYA AND ENZYMES PO) Take by mouth.    . Multiple Vitamin (MULTIVITAMINS PO) Take by mouth daily.      . OMEGA 3 1000 MG CAPS Take 1,000-2,000 mg by mouth daily.     . Probiotic Product ([place]) Take by mouth.    Marland Kitchen ibuprofen (ADVIL,MOTRIN) 200 MG tablet Take 200-400 mg by mouth as needed.    . simethicone (MYLICON) 308 MG chewable tablet Chew 125 mg by mouth every 6 (six) hours as needed.    . [DISCONTINUED] ranitidine (RANITIDINE 75) 75 MG tablet Take 75 mg by mouth 2 (two) times daily.       No current facility-administered medications on file prior to visit.   No Known Allergies  ROS:  No headaches, dizziness, chest pain, abdominal pain, fevers, URI symptoms, cough, shortness of breath, GI complaints, rashes or other concerns  PHYSICAL EXAM: BP 128/90 mmHg  Pulse 72  Ht 5\' 9"  (1.753 m)   Wt 178 lb (80.74 kg)  BMI 26.27 kg/m2  Well developed, pleasant male, in no distress.  Accompanied by his wife and sleeping infant daughter HEENT: PERRL, EOMI, conjunctiva clear Neck : no lymphadenopathy Heart: regular rate and rhythm Lungs: clear bilaterally Extremities: no edema  Lab Results  Component Value Date   CHOL 246* 09/28/2014   HDL 33* 09/28/2014   LDLCALC NOT CALC 09/28/2014   TRIG 570* 09/28/2014   CHOLHDL 7.5 09/28/2014   ASSESSMENT/PLAN:  Hypertriglyceridemia - Plan: fenofibrate 160 MG tablet, Lipid panel, Hepatic function panel, Glucose, random  Diet reviewed in detail. Risks of elevated TG reviewed Risks/side effects of meds reviewed. Increase fish oil to 3000mg  daily. Encouraged daily exercise to help raise HDL. Continue low cholesterol diet.  Labs in 6 wks (fasting), CPE in 6 months  25 min visit, more than 1/2 spent counseling (diet, meds, etc)

## 2014-10-03 NOTE — Patient Instructions (Signed)
Start taking the fenofibrate once daily. Increase the fish oil to 3000-4000mg  daily (remember--the ones you have require you to take TWO capsules for ONE serving of 1600mg .). Take 4-6 daily.  Follow a lowfat diet--healthier snacks (no lucky charms), portion control, and try and lose about 10 pounds. Try and get at least 30 minutes of exercise every day.  Food Choices to Lower Your Triglycerides  Triglycerides are a type of fat in your blood. High levels of triglycerides can increase the risk of heart disease and stroke. If your triglyceride levels are high, the foods you eat and your eating habits are very important. Choosing the right foods can help lower your triglycerides.  WHAT GENERAL GUIDELINES DO I NEED TO FOLLOW?  Lose weight if you are overweight.   Limit or avoid alcohol.   Fill one half of your plate with vegetables and green salads.   Limit fruit to two servings a day. Choose fruit instead of juice.   Make one fourth of your plate whole grains. Look for the word "whole" as the first word in the ingredient list.  Fill one fourth of your plate with lean protein foods.  Enjoy fatty fish (such as salmon, mackerel, sardines, and tuna) three times a week.   Choose healthy fats.   Limit foods high in starch and sugar.  Eat more home-cooked food and less restaurant, buffet, and fast food.  Limit fried foods.  Cook foods using methods other than frying.  Limit saturated fats.  Check ingredient lists to avoid foods with partially hydrogenated oils (trans fats) in them. WHAT FOODS CAN I EAT?  Grains Whole grains, such as whole wheat or whole grain breads, crackers, cereals, and pasta. Unsweetened oatmeal, bulgur, barley, quinoa, or brown rice. Corn or whole wheat flour tortillas.  Vegetables Fresh or frozen vegetables (raw, steamed, roasted, or grilled). Green salads. Fruits All fresh, canned (in natural juice), or frozen fruits. Meat and Other Protein  Products Ground beef (85% or leaner), grass-fed beef, or beef trimmed of fat. Skinless chicken or [place]. Ground chicken or [place]. Pork trimmed of fat. All fish and seafood. Eggs. Dried beans, peas, or lentils. Unsalted nuts or seeds. Unsalted canned or dry beans. Dairy Low-fat dairy products, such as skim or 1% milk, 2% or reduced-fat cheeses, low-fat ricotta or cottage cheese, or plain low-fat yogurt. Fats and Oils Tub margarines without trans fats. Light or reduced-fat mayonnaise and salad dressings. Avocado. Safflower, olive, or canola oils. Natural peanut or almond butter. The items listed above may not be a complete list of recommended foods or beverages. Contact your dietitian for more options. WHAT FOODS ARE NOT RECOMMENDED?  Grains White bread. White pasta. White rice. Cornbread. Bagels, pastries, and croissants. Crackers that contain trans fat. Vegetables White potatoes. Corn. Creamed or fried vegetables. Vegetables in a cheese sauce. Fruits Dried fruits. Canned fruit in light or heavy syrup. Fruit juice. Meat and Other Protein Products Fatty cuts of meat. Ribs, chicken wings, bacon, sausage, bologna, salami, chitterlings, fatback, hot dogs, bratwurst, and packaged luncheon meats. Dairy Whole or 2% milk, cream, half-and-half, and cream cheese. Whole-fat or sweetened yogurt. Full-fat cheeses. Nondairy creamers and whipped toppings. Processed cheese, cheese spreads, or cheese curds. Sweets and Desserts Corn syrup, sugars, honey, and molasses. Candy. Jam and jelly. Syrup. Sweetened cereals. Cookies, pies, cakes, donuts, muffins, and ice cream. Fats and Oils Butter, stick margarine, lard, shortening, ghee, or bacon fat. Coconut, palm kernel, or palm oils. Beverages Alcohol. Sweetened drinks (such as sodas, lemonade, and  fruit drinks or punches). The items listed above may not be a complete list of foods and beverages to avoid. Contact your dietitian for more information. Document  Released: 04/30/2004 Document Revised: 07/18/2013 Document Reviewed: 05/17/2013 [facility] Patient Information 2015 [place], [place]. This information is not intended to replace advice given to you by your health care provider. Make sure you discuss any questions you have with your health care provider.

## 2014-11-16 ENCOUNTER — Other Ambulatory Visit: Payer: BC Managed Care – PPO

## 2014-11-16 DIAGNOSIS — E781 Pure hyperglyceridemia: Secondary | ICD-10-CM

## 2014-11-16 LAB — HEPATIC FUNCTION PANEL
ALBUMIN: 4.6 g/dL (ref 3.5–5.2)
ALK PHOS: 43 U/L (ref 39–117)
ALT: 30 U/L (ref 0–53)
AST: 26 U/L (ref 0–37)
Bilirubin, Direct: 0.1 mg/dL (ref 0.0–0.3)
Indirect Bilirubin: 0.5 mg/dL (ref 0.2–1.2)
TOTAL PROTEIN: 7.1 g/dL (ref 6.0–8.3)
Total Bilirubin: 0.6 mg/dL (ref 0.2–1.2)

## 2014-11-16 LAB — GLUCOSE, RANDOM: Glucose, Bld: 94 mg/dL (ref 70–99)

## 2014-11-16 LAB — LIPID PANEL
CHOLESTEROL: 154 mg/dL (ref 0–200)
HDL: 34 mg/dL — ABNORMAL LOW (ref >=40)
LDL CALC: 91 mg/dL (ref 0–99)
TRIGLYCERIDES: 146 mg/dL (ref <150)
Total CHOL/HDL Ratio: 4.5 Ratio
VLDL: 29 mg/dL (ref 0–40)

## 2014-11-19 ENCOUNTER — Telehealth: Payer: Self-pay | Admitting: *Deleted

## 2014-11-19 DIAGNOSIS — E781 Pure hyperglyceridemia: Secondary | ICD-10-CM

## 2014-11-19 MED ORDER — FENOFIBRATE 160 MG PO TABS: 160.0000 mg | ORAL_TABLET | Freq: Every day | ORAL | Status: DC

## 2014-11-19 NOTE — Telephone Encounter (Signed)
Results were release.  Okay to refill fenofibrate x 6 months

## 2014-11-19 NOTE — Telephone Encounter (Signed)
Patient called and had labs last week-would like to know if you can please release through MyChart, thanks.

## 2015-02-01 ENCOUNTER — Telehealth: Payer: Self-pay | Admitting: Family Medicine

## 2015-02-01 NOTE — Telephone Encounter (Signed)
Called [facility] and renewed P.A. Dexilant and was approved til 02/01/16 case [id number] Pt informed

## 2015-02-26 ENCOUNTER — Telehealth: Payer: Self-pay | Admitting: Family Medicine

## 2015-02-26 DIAGNOSIS — K219 Gastro-esophageal reflux disease without esophagitis: Secondary | ICD-10-CM

## 2015-02-26 NOTE — Telephone Encounter (Signed)
Pt's wife called & states Dexilant still not going thru.  Called [facility] [phone] regarding approval & they researched it and states this medication requires 2 separate approvals first one that has been approved is quantity exception and next is formulary exception. Started P.A. Over the phone but pt must try all the formulary alternatives which are Esomeprazole, Rabeprazole, Omeprazole, Lansoprazole, Pantoprazole, Nexium, Prilosec, Prevacid & Zegerid. Wife called back & asked if I would email her the list and she would ask patient which he has tried.

## 2015-03-01 NOTE — Telephone Encounter (Signed)
Wife messaged back that pt has not tried Zegerid nor Pantoprazole and would like Dr. Tomi Bamberger to call in Pantoprazole for him to try

## 2015-03-03 ENCOUNTER — Encounter: Payer: Self-pay | Admitting: Family Medicine

## 2015-03-03 MED ORDER — PANTOPRAZOLE SODIUM 40 MG PO TBEC: 40.0000 mg | DELAYED_RELEASE_TABLET | Freq: Two times a day (BID) | ORAL | Status: DC

## 2015-03-03 NOTE — Telephone Encounter (Signed)
Pantoprazole sent to pharmacy, and MyChart message was sent to patient (advising him of need to take it twice daily, before meals).

## 2015-03-05 ENCOUNTER — Telehealth: Payer: Self-pay | Admitting: Family Medicine

## 2015-03-05 NOTE — Telephone Encounter (Signed)
Wife called & states now Pantoprazole requiring P.A. Initiated P.A. Pantoprazole

## 2015-03-11 NOTE — Telephone Encounter (Signed)
Called [employer] t# [phone], Prior auth for Pantoprazole now approved til 03/04/16, #60/30, paid claim.  Called pharmacy and went thru for $12. Called pt & let him know

## 2015-04-15 ENCOUNTER — Encounter: Payer: Self-pay | Admitting: Family Medicine

## 2015-04-15 ENCOUNTER — Ambulatory Visit (INDEPENDENT_AMBULATORY_CARE_PROVIDER_SITE_OTHER): Payer: BC Managed Care – PPO | Admitting: Family Medicine

## 2015-04-15 VITALS — BP 130/86 | HR 84 | Temp 97.6°F | Resp 14 | Ht 70.0 in | Wt 177.6 lb

## 2015-04-15 DIAGNOSIS — B354 Tinea corporis: Secondary | ICD-10-CM | POA: Diagnosis not present

## 2015-04-15 DIAGNOSIS — Z0000 Encounter for general adult medical examination without abnormal findings: Secondary | ICD-10-CM | POA: Diagnosis not present

## 2015-04-15 DIAGNOSIS — E781 Pure hyperglyceridemia: Secondary | ICD-10-CM | POA: Diagnosis not present

## 2015-04-15 DIAGNOSIS — K219 Gastro-esophageal reflux disease without esophagitis: Secondary | ICD-10-CM | POA: Diagnosis not present

## 2015-04-15 DIAGNOSIS — Z23 Encounter for immunization: Secondary | ICD-10-CM | POA: Diagnosis not present

## 2015-04-15 DIAGNOSIS — I471 Supraventricular tachycardia: Secondary | ICD-10-CM

## 2015-04-15 DIAGNOSIS — R030 Elevated blood-pressure reading, without diagnosis of hypertension: Secondary | ICD-10-CM

## 2015-04-15 LAB — POCT URINALYSIS DIPSTICK
Bilirubin, UA: NEGATIVE
Glucose, UA: NEGATIVE
Ketones, UA: NEGATIVE
Leukocytes, UA: NEGATIVE
Nitrite, UA: NEGATIVE
PROTEIN UA: NEGATIVE
RBC UA: NEGATIVE
SPEC GRAV UA: 1.02
Urobilinogen, UA: NEGATIVE
pH, UA: 6

## 2015-04-15 LAB — COMPREHENSIVE METABOLIC PANEL
ALT: 25 U/L (ref 9–46)
AST: 23 U/L (ref 10–40)
Albumin: 5.1 g/dL (ref 3.6–5.1)
Alkaline Phosphatase: 47 U/L (ref 40–115)
BUN: 22 mg/dL (ref 7–25)
CHLORIDE: 101 mmol/L (ref 98–110)
CO2: 26 mmol/L (ref 20–31)
Calcium: 9.5 mg/dL (ref 8.6–10.3)
Creat: 1 mg/dL (ref 0.60–1.35)
GLUCOSE: 93 mg/dL (ref 65–99)
POTASSIUM: 4.1 mmol/L (ref 3.5–5.3)
Sodium: 137 mmol/L (ref 135–146)
Total Bilirubin: 0.6 mg/dL (ref 0.2–1.2)
Total Protein: 7.5 g/dL (ref 6.1–8.1)

## 2015-04-15 LAB — LIPID PANEL
Cholesterol: 163 mg/dL (ref 125–200)
HDL: 41 mg/dL (ref >=40)
LDL Cholesterol: 97 mg/dL (ref <130)
Total CHOL/HDL Ratio: 4 Ratio (ref <=5.0)
Triglycerides: 127 mg/dL (ref <150)
VLDL: 25 mg/dL (ref <30)

## 2015-04-15 LAB — TSH: TSH: 1.619 u[IU]/mL (ref 0.350–4.500)

## 2015-04-15 NOTE — Patient Instructions (Addendum)
HEALTH MAINTENANCE RECOMMENDATIONS:  It is recommended that you get at least 30 minutes of aerobic exercise at least 5 days/week (for weight loss, you may need as much as 60-90 minutes). This can be any activity that gets your heart rate up. This can be divided in 10-15 minute intervals if needed, but try and build up your endurance at least once a week.  Weight bearing exercise is also recommended twice weekly.  Eat a healthy diet with lots of vegetables, fruits and fiber.  "Colorful" foods have a lot of vitamins (ie green vegetables, tomatoes, red peppers, etc).  Limit sweet tea, regular sodas and alcoholic beverages, all of which has a lot of calories and sugar.  Up to 2 alcoholic drinks daily may be beneficial for men (unless trying to lose weight, watch sugars).  Drink a lot of water.  Sunscreen of at least SPF 30 should be used on all sun-exposed parts of the skin when outside between the hours of 10 am and 4 pm (not just when at beach or pool, but even with exercise, golf, tennis, and yard work!)  Use a sunscreen that says "broad spectrum" so it covers both UVA and UVB rays, and make sure to reapply every 1-2 hours.  Remember to change the batteries in your smoke detectors when changing your clock times in the spring and fall.  Use your seat belt every time you are in a car, and please drive safely and not be distracted with cell phones and texting while driving.   Low-Sodium Eating Plan Sodium raises blood pressure and causes water to be held in the body. Getting less sodium from food will help lower your blood pressure, reduce any swelling, and protect your heart, liver, and kidneys. We get sodium by adding salt (sodium chloride) to food. Most of our sodium comes from canned, boxed, and frozen foods. Restaurant foods, fast foods, and pizza are also very high in sodium. Even if you take medicine to lower your blood pressure or to reduce fluid in your body, getting less sodium from your food  is important. WHAT IS MY PLAN? Most people should limit their sodium intake to 2,300 mg a day. Your health care provider recommends that you limit your sodium intake to __________ a day.  WHAT DO I NEED TO KNOW ABOUT THIS EATING PLAN? For the low-sodium eating plan, you will follow these general guidelines:  Choose foods with a % Daily Value for sodium of less than 5% (as listed on the food label).   Use salt-free seasonings or herbs instead of table salt or sea salt.   Check with your health care provider or pharmacist before using salt substitutes.   Eat fresh foods.  Eat more vegetables and fruits.  Limit canned vegetables. If you do use them, rinse them well to decrease the sodium.   Limit cheese to 1 oz (28 g) per day.   Eat lower-sodium products, often labeled as "lower sodium" or "no salt added."  Avoid foods that contain monosodium glutamate (MSG). MSG is sometimes added to [place] food and some canned foods.  Check food labels (Nutrition Facts labels) on foods to learn how much sodium is in one serving.  Eat more home-cooked food and less restaurant, buffet, and fast food.  When eating at a restaurant, ask that your food be prepared with less salt or none, if possible.  HOW DO I READ FOOD LABELS FOR SODIUM INFORMATION? The Nutrition Facts label lists the amount of sodium in one  serving of the food. If you eat more than one serving, you must multiply the listed amount of sodium by the number of servings. Food labels may also identify foods as:  Sodium free--Less than 5 mg in a serving.  Very low sodium--35 mg or less in a serving.  Low sodium--140 mg or less in a serving.  Light in sodium--50% less sodium in a serving. For example, if a food that usually has 300 mg of sodium is changed to become light in sodium, it will have 150 mg of sodium.  Reduced sodium--25% less sodium in a serving. For example, if a food that usually has 400 mg of sodium is changed to  reduced sodium, it will have 300 mg of sodium. WHAT FOODS CAN I EAT? Grains Low-sodium cereals, including oats, puffed wheat and rice, and shredded wheat cereals. Low-sodium crackers. Unsalted rice and pasta. Lower-sodium bread.  Vegetables Frozen or fresh vegetables. Low-sodium or reduced-sodium canned vegetables. Low-sodium or reduced-sodium tomato sauce and paste. Low-sodium or reduced-sodium tomato and vegetable juices.  Fruits Fresh, frozen, and canned fruit. Fruit juice.  Meat and Other Protein Products Low-sodium canned tuna and salmon. Fresh or frozen meat, poultry, seafood, and fish. Lamb. Unsalted nuts. Dried beans, peas, and lentils without added salt. Unsalted canned beans. Homemade soups without salt. Eggs.  Dairy Milk. Soy milk. Ricotta cheese. Low-sodium or reduced-sodium cheeses. Yogurt.  Condiments Fresh and dried herbs and spices. Salt-free seasonings. Onion and garlic powders. Low-sodium varieties of mustard and ketchup. Lemon juice.  Fats and Oils Reduced-sodium salad dressings. Unsalted butter.  Other Unsalted popcorn and pretzels.  The items listed above may not be a complete list of recommended foods or beverages. Contact your dietitian for more options. WHAT FOODS ARE NOT RECOMMENDED? Grains Instant hot cereals. Bread stuffing, pancake, and biscuit mixes. Croutons. Seasoned rice or pasta mixes. Noodle soup cups. Boxed or frozen macaroni and cheese. Self-rising flour. Regular salted crackers. Vegetables Regular canned vegetables. Regular canned tomato sauce and paste. Regular tomato and vegetable juices. Frozen vegetables in sauces. Salted french fries. Olives. Angie Fava. Relishes. Sauerkraut. Salsa. Meat and Other Protein Products Salted, canned, smoked, spiced, or pickled meats, seafood, or fish. Bacon, ham, sausage, hot dogs, corned beef, chipped beef, and packaged luncheon meats. Salt pork. Jerky. Pickled herring. Anchovies, regular canned tuna, and  sardines. Salted nuts. Dairy Processed cheese and cheese spreads. Cheese curds. Blue cheese and cottage cheese. Buttermilk.  Condiments Onion and garlic salt, seasoned salt, table salt, and sea salt. Canned and packaged gravies. Worcestershire sauce. Tartar sauce. Barbecue sauce. Teriyaki sauce. Soy sauce, including reduced sodium. Steak sauce. Fish sauce. Oyster sauce. Cocktail sauce. Horseradish. Regular ketchup and mustard. Meat flavorings and tenderizers. Bouillon cubes. Hot sauce. Tabasco sauce. Marinades. Taco seasonings. Relishes. Fats and Oils Regular salad dressings. Salted butter. Margarine. Ghee. Bacon fat.  Other Potato and tortilla chips. Corn chips and puffs. Salted popcorn and pretzels. Canned or dried soups. Pizza. Frozen entrees and pot pies.  The items listed above may not be a complete list of foods and beverages to avoid. Contact your dietitian for more information. Document Released: 01/02/2002 Document Revised: 07/18/2013 Document Reviewed: 05/17/2013 [facility] Patient Information 2015 [place], [place]. This information is not intended to replace advice given to you by your health care provider. Make sure you discuss any questions you have with your health care provider.  Body Ringworm Ringworm (tinea corporis) is a fungal infection of the skin on the body. This infection is not caused by worms, but is actually  caused by a fungus. Fungus normally lives on the top of your skin and can be useful. However, in the case of ringworms, the fungus grows out of control and causes a skin infection. It can involve any area of skin on the body and can spread easily from one person to another (contagious). Ringworm is a common problem for children, but it can affect adults as well. Ringworm is also often found in athletes, especially wrestlers who share equipment and mats.  CAUSES  Ringworm of the body is caused by a fungus called dermatophyte. It can spread by:  Touchingother people  who are infected.  Touchinginfected pets.  Touching or sharingobjects that have been in contact with the infected person or pet (hats, combs, towels, clothing, sports equipment). SYMPTOMS   Itchy, raised red spots and bumps on the skin.  Ring-shaped rash.  Redness near the border of the rash with a clear center.  Dry and scaly skin on or around the rash. Not every person develops a ring-shaped rash. Some develop only the red, scaly patches. DIAGNOSIS  Most often, ringworm can be diagnosed by performing a skin exam. Your caregiver may choose to take a skin scraping from the affected area. The sample will be examined under the microscope to see if the fungus is present.  TREATMENT  Body ringworm may be treated with a topical antifungal cream or ointment. Sometimes, an antifungal shampoo that can be used on your body is prescribed. You may be prescribed antifungal medicines to take by mouth if your ringworm is severe, keeps coming back, or lasts a long time.  HOME CARE INSTRUCTIONS   Only take over-the-counter or prescription medicines as directed by your caregiver.  Wash the infected area and dry it completely before applying yourcream or ointment.  When using antifungal shampoo to treat the ringworm, leave the shampoo on the body for 3-5 minutes before rinsing.   Wear loose clothing to stop clothes from rubbing and irritating the rash.  Wash or change your bed sheets every night while you have the rash.  Have your pet treated by your veterinarian if it has the same infection. To prevent ringworm:   Practice good hygiene.  Wear sandals or shoes in public places and showers.  Do not share personal items with others.  Avoid touching red patches of skin on other people.  Avoid touching pets that have bald spots or wash your hands after doing so. SEEK MEDICAL CARE IF:   Your rash continues to spread after 7 days of treatment.  Your rash is not gone in 4 weeks.  The  area around your rash becomes red, warm, tender, and swollen. Document Released: 07/10/2000 Document Revised: 04/06/2012 Document Reviewed: 01/25/2012 [facility] Patient Information 2015 [place], [place]. This information is not intended to replace advice given to you by your health care provider. Make sure you discuss any questions you have with your health care provider.

## 2015-04-15 NOTE — Progress Notes (Signed)
Called and notified the pharmacy.

## 2015-04-15 NOTE — Progress Notes (Signed)
Chief Complaint  Patient presents with  . Annual Exam    Had eye exam at [facility]. At dental visit asked if he had thyroid trouble based on the panoramic image. Wants THS included in blood work Health visitor. Concerned about BP and wants to discuss it.    Jordan Stafford is a 44 y.o. male who presents for a complete physical. He is accompanied by his wife and 58 month old daughter. He is also here to f/u on chronic problems (cholesterol, reflux). Concerns are as per chief complaint.  Hypertriglyceridemia: He was started on fenofibrate 115m six months ago. Recheck after being on meds: Lab Results  Component Value Date   CHOL 154 11/16/2014   HDL 34* 11/16/2014   LDLCALC 91 11/16/2014   TRIG 146 11/16/2014   CHOLHDL 4.5 11/16/2014   He is now eating Raisin Bran and Special K instead of chocolate Lucky Charms.  Eating more whole grains. No longer having the Special K pastry crisps.  Eating more vegetables; only likes fruit cut up, so doesn't eat it as often. He bumped up the fish oil to 2 capsules twice daily.  H/o elevated blood pressures--[facility]  Yesterday he saw 150/90's, and also noted his pulse to be higher. The others range from 116 -137/74-89.  Averaging mid 120's over low 80's. Pulse 73-91, mostly upper 70's.   He eats take-out [place]food and dill pickles frequently. He isn't getting much regular exercise.  He admits he was anxious about the BP yesterday (and checked it often, stayed high, noticed the higher pulse).  GERD: He changed to protonix from [place]arnedue to insurance coverage.  Has been doing well on just once daily (was prescribed for twice daily), but sometimes in the evenings requires Tums if he has tomato sauce.  Denies dysphagia. Questions about need for further investigation/GI, after a colleague at work died fairly suddenly after being diagnosed with colon cancer.  He denies dysphagia, weight loss, abdominal pain.  Dentist  mentioned something about his thyroid after doing panoramic x-rays, but reassured him after exam. Asking to have TSH checked.  It was normal last year.  Denies hair/skin/bowel changes.  SVT--last saw cardiologist 01/2014. He plans to see him every other year. He is off diltiazem (and has been for almost 3 years). Had normal stress test 08/2013. He has had no further episodes of SVT since 2008.He still gets occasional shortness of breath with exertion--not really SOB, but that his "stomach is out of balance", needs a quick rest after going up 3 flights of stairs at work, worse if he hasn't taken his PPI that morning yet.Feels a slight pressure in his upper abdomen/lower chest.  Immunization History  Administered Date(s) Administered  . Influenza Split 05/27/2011  . Influenza,inj,Quad PF,36+ Mos 04/11/2014  . Tdap 07/25/2008   Last colonoscopy: never  Last PSA: never  Dentist: twice yearly  Ophtho: once yearly  Exercise: YZettie Phoand work around tAmerican ExpressWalks the dog less--only if wife is home.  Exercising less than last year, since having the baby.  Past Medical History  Diagnosis Date  . Supraventricular tachycardia     Dr KCaryl Comes . Anxiety   . GERD (gastroesophageal reflux disease)   . IBS (irritable bowel syndrome)   . Seasonal allergies   . Renal stone   . Acne   . Hx of cardiovascular stress test     ETT-Myoview (08/2013):  No ischemia, EF 67%; normal study.  Past Surgical History  Procedure Laterality Date  . Mouth surgery      x3  . Toenail excision      ingrown nail    Social History   Social History  . Marital Status: Married    Spouse Name: N/A  . Number of Children: 1  . Years of Education: N/A   Occupational History  . 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 Smoking status: Never Smoker   . Smokeless tobacco: Never Used  . Alcohol Use: No  . Drug Use: No  . Sexual Activity:    Partners: Female   Other  Topics Concern  . Not on file   Social History Narrative   Married 12/2011, has a cockapoo. Has a daughter, Bubba Hales (born 09/02/14). 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    Family History  Problem Relation Age of Onset  . Coronary artery disease Neg Hx   . Mitchell White syndrome Mother   . Hypertension Mother   . COPD Mother     smoker  . Urolithiasis Mother   . Diabetes Father   . Hypertension Father   . Urolithiasis Father   . Cancer Father 81    renal  . Kidney cancer Father   . Heart disease Maternal Grandmother     MI at 28  . Cancer Paternal Grandfather     lung  . Heart disease Paternal Grandmother     late 30's    Outpatient Encounter Prescriptions as of 04/15/2015  Medication Sig Note  . calcium carbonate (TUMS - DOSED IN MG ELEMENTAL CALCIUM) 500 MG chewable tablet Chew 2 tablets by mouth as needed for indigestion or heartburn. 04/10/2014: Only rarely needs  . cetirizine (ZYRTEC) 10 MG tablet Take 10 mg by mouth daily.   . Digestive Enzymes (PAPAYA AND ENZYMES PO) Take by mouth. 01/07/2012: Takes as needed, if upset stomach  . fenofibrate 160 MG tablet Take 1 tablet (160 mg total) by mouth daily.   Marland Kitchen ibuprofen (ADVIL,MOTRIN) 200 MG tablet Take 200-400 mg by mouth as needed.   . Multiple Vitamin (MULTIVITAMINS PO) Take by mouth daily.   04/15/2015: Takes daily   . OMEGA 3 1000 MG CAPS Take 1,000-2,000 mg by mouth daily.  04/15/2015: Takes 2 BID  . pantoprazole (PROTONIX) 40 MG tablet Take 1 tablet (40 mg total) by mouth 2 (two) times daily before a meal. 04/15/2015: Usually takes just once daily  . Probiotic Product ([place]) Take by mouth.   . simethicone (MYLICON) 505 MG chewable tablet Chew 125 mg by mouth every 6 (six) hours as needed.   . [DISCONTINUED] dexlansoprazole (DEXILANT) 60 MG capsule TAKE ONE CAPSULE BY MOUTH ONE TIME DAILY (Patient not taking: Reported on 04/15/2015) 04/15/2015: switched   No facility-administered encounter medications on  file as of 04/15/2015.    No Known Allergies  ROS: The patient denies anorexia, fever, weight changes, headaches, vision loss, decreased hearing, ear pain, hoarseness, chest pain, dizziness, syncope, dyspnea on exertion, cough, swelling, nausea, vomiting, diarrhea, constipation, abdominal pain, melena, hematochezia, indigestion/heartburn (only after certain foods in the evening, relieved by Tums), hematuria, incontinence, erectile dysfunction, nocturia, weakened urine stream, dysuria, genital lesions, joint pains, numbness, tingling, weakness, tremor, depression, abnormal bleeding/bruising, or enlarged lymph nodes  Some anxiety related to work stress. Denies insomnia. No recurrences of SVT He has had a slight rash (2 lesions) on his hip/buttock for the last 3 weeks, slightly itchy (  he did not report this at all, but noticed during his exam)   PHYSICAL EXAM:  BP 130/86 mmHg  Pulse 84  Temp(Src) 97.6 F (36.4 C) (Tympanic)  Resp 14  Ht _0  (1.778 m)  Wt 177 lb 9.6 oz (80.559 kg)  BMI 25.48 kg/m2 134/84 on repeat by MD   General Appearance:  Alert, cooperative, no distress, appears stated age, mildly anxious, but overall in good spirits  Head:  Normocephalic, without obvious abnormality, atraumatic   Eyes:  PERRL, conjunctiva/corneas clear, EOM's intact, fundi  benign   Ears:  Normal TM's and external ear canals   Nose:  Nares normal, mucosa normal, no drainage or sinus tenderness   Throat:  Lips, mucosa, and tongue normal; teeth and gums normal   Neck:  Supple, no lymphadenopathy; thyroid: no enlargement/tenderness/nodules; no carotid  bruit or JVD   Back:  Spine nontender, no curvature, ROM normal, no CVA tenderness   Lungs:  Clear to auscultation bilaterally without wheezes, rales or ronchi; respirations unlabored   Chest Wall:  No tenderness or deformity   Heart:  Regular rate and rhythm, S1 and S2 normal, no murmur, rub  or gallop   Breast Exam:  No  chest wall tenderness, masses or gynecomastia   Abdomen:  Soft, non-tender, nondistended, normoactive bowel sounds,  no masses, no hepatosplenomegaly   Genitalia:  Normal male external genitalia without lesions. Testicles without masses. No inguinal hernias.   Rectal:  Normal sphincter tone, no masses or tenderness; guaiac negative stool. Prostate smooth, no nodules, not enlarged.   Extremities:  No clubbing, cyanosis or edema   Pulses:  2+ and symmetric all extremities   Skin:  Skin color, texture, turgor normal. 2 ringlike areas--one on the left upper buttock, and one on the right lateral hip, about 3-4 cm in size. Minimally raised edges, no flaking, clear center  Lymph nodes:  Cervical, supraclavicular, and axillary nodes normal   Neurologic:  CNII-XII intact, normal strength, sensation and gait; reflexes 2+ and symmetric throughout    Psych: Normal mood, hygiene and grooming. Full range of affect, slightly anxious.          ASSESSMENT/PLAN:  Routine general medical examination at a health care facility - Plan: POCT urinalysis dipstick, Comprehensive metabolic panel, Lipid panel, TSH, Flu Vaccine QUAD 36+ mos IM  SVT/ PSVT/ PAT - doing well off meds, without recurrence - Plan: TSH  Gastroesophageal reflux disease, esophagitis presence not specified - not quite as well controlled on Protonix compared to Dexilant--use BID more often (daily or prn related to diet)  ELEVATED BP READING WITHOUT DX HYPERTENSION - some component of anxiety, as well as sodium intake. low sodium diet reviewed.  Hypertriglyceridemia - due for labs; continue lowfat diet. - Plan: Comprehensive metabolic panel, Lipid panel  Tinea corporis - suspected. DDx reviewed. trial of OTC antifungal (lamisil, lotrimin) for 2-3 weeks.   Recommended at least 30 minutes of aerobic activity at least 5 days/week, weight-bearing exercise 2-3x/wk; proper sunscreen use reviewed; healthy  diet and alcohol recommendations (less than or equal to 2 drinks/day) reviewed; regular seatbelt use; changing batteries in smoke detectors. Self-testicular exams. Immunization recommendations discussed--flu shot today. Colonoscopy recommendations reviewed--age 53.   CBC, c-met, lipid, TSH  Counseled on low sodium diet, healthy snacks, drinking more water, getting regular exercise.  May have some component of anxiety contributing to some of the higher blood pressures, when pulse is high--reassured.  Counseled re: reflux and risks in detail. No need for GI referral at  this time. Consider EGD when he is 18 and going to colonoscopy, sooner if develops dysphagia or worsening reflux despite treatment.   F/u 6 mos for med check, sooner prn.

## 2015-06-01 ENCOUNTER — Encounter: Payer: Self-pay | Admitting: Family Medicine

## 2015-06-05 ENCOUNTER — Ambulatory Visit (INDEPENDENT_AMBULATORY_CARE_PROVIDER_SITE_OTHER): Payer: BC Managed Care – PPO | Admitting: Family Medicine

## 2015-06-05 ENCOUNTER — Encounter: Payer: Self-pay | Admitting: Family Medicine

## 2015-06-05 VITALS — BP 122/76 | HR 76 | Temp 98.1°F | Ht 70.0 in | Wt 183.4 lb

## 2015-06-05 DIAGNOSIS — R05 Cough: Secondary | ICD-10-CM

## 2015-06-05 DIAGNOSIS — J309 Allergic rhinitis, unspecified: Secondary | ICD-10-CM

## 2015-06-05 DIAGNOSIS — R21 Rash and other nonspecific skin eruption: Secondary | ICD-10-CM

## 2015-06-05 DIAGNOSIS — R059 Cough, unspecified: Secondary | ICD-10-CM

## 2015-06-05 NOTE — Progress Notes (Signed)
Chief Complaint  Patient presents with  . Cough    x 3 weeks and is worsening. Has a rash that has been there since Sept CPE, OTC treatment has not worked-appears to be the same.    He started with just occasional cough, 2-3 times every hour or so, like a tickle in his chest (not the throat).  It didn't bother him that much, but was intermittent.  It seems to be intermittent during the day, worse in the morning and later in the evenings.  Cough is dry, only rarely productive (not enough to spit out to look at).  He has had some nasal congestion. Denies discolored mucus or sinus pressure. He has been taking zyrtec regularly (since found to have allergies this time of the year, in around 2012, takes it year-round).  He has tried herbal tea and honey in the mornings, with temporary benefit, and blackberry brandy in the evening also helps soothe his cough.  Denies chest tightness or dyspnea on exertion (just during a coughing spell)  Rash on buttocks/hip since early September.  He has used lotrimin once daily since then, and hasn't noted any change.  PMH, PSH, SH reviewed.  Outpatient Encounter Prescriptions as of 06/05/2015  Medication Sig Note  . cetirizine (ZYRTEC) 10 MG tablet Take 10 mg by mouth daily.   . fenofibrate 160 MG tablet Take 1 tablet (160 mg total) by mouth daily.   . Multiple Vitamin (MULTIVITAMINS PO) Take by mouth daily.   04/15/2015: Takes daily   . OMEGA 3 1000 MG CAPS Take 1,000-2,000 mg by mouth daily.  04/15/2015: Takes 2 BID  . pantoprazole (PROTONIX) 40 MG tablet Take 1 tablet (40 mg total) by mouth 2 (two) times daily before a meal. 06/05/2015: BID  . Probiotic Product ([place]) Take by mouth.   . calcium carbonate (TUMS - DOSED IN MG ELEMENTAL CALCIUM) 500 MG chewable tablet Chew 2 tablets by mouth as needed for indigestion or heartburn. 04/10/2014: Only rarely needs  . Digestive Enzymes (PAPAYA AND ENZYMES PO) Take by mouth. 01/07/2012: Takes as needed,  if upset stomach  . ibuprofen (ADVIL,MOTRIN) 200 MG tablet Take 200-400 mg by mouth as needed.   . simethicone (MYLICON) 694 MG chewable tablet Chew 125 mg by mouth every 6 (six) hours as needed.    No facility-administered encounter medications on file as of 06/05/2015.   No Known Allergies  ROS:  No fever, chills, chest pain, palpitations, nausea, vomiting, diarrhea, bleeding, bruising.  Rash as per HPI.  No depression, insomnia, headaches, dizziness or other complaints, except as noted in HPI.  PHYSICAL EXAM: BP 122/76 mmHg  Pulse 76  Temp(Src) 98.1 F (36.7 C) (Tympanic)  Ht 5\' 10"  (1.778 m)  Wt 183 lb 6.4 oz (83.19 kg)  BMI 26.32 kg/m2 Well developed, pleasant male, accompanied by his infant daughter. No coughing during visit. HEENT: PERRL, EOMI, conjunctiva clear. TM's and EAC's normal. Nasal mucosa is mild-moderately edematous, with thick white mucus noted deep in left nares. OP is clear. Sinuses are nontender Neck: no lymphadenopathy or mass Heart: regular rate and rhythm Lungs: clear bilaterally. No wheezes, rales, ronchi Skin: Two ring-like lesions--slightly raised areas of erythema, no flaking. 5 x 3 cm L upper buttock 4 x 2 cm R lateral hip  ASSESSMENT/PLAN:  Cough  Allergic rhinitis, unspecified allergic rhinitis type - add mucinex, DM prn; continue daily zyrtec  Rash and nonspecific skin eruption - ringworm vs granuloma annulare   Cough--suspect related to allergies  and postnasal drip rather than any acute infection. Continue the zyrtec daily. Add Mucinex (guaifenesin)--this is an expectorant to loosen the mucus/phlegm. Consider using either the DM version of mucinex, or getting the plain mucinex, and a separate delsym syrup, to use just if/when needed for cough  S/sx infection reviewed, call for ABX or OV if develops.  Rash appears unchanged.  I doubt it is ringworm given lack of improvement with the cream, although it should ideally have been used twice  daily.  Let's try using Lamisil instead for 2-3 weeks.  If no change at all, then doubt it is fungal (not ringworm). The other condition that can mimic ringworm is granuloma annulare.  This is usually self-limited, and if not bothersome in appearance, treatment is not mandatory. Potential treatments, vs punch bx for definitive dx reviewed; Can consider skin scraping if stops using topical steroid, after no improvement.

## 2015-06-05 NOTE — Patient Instructions (Addendum)
  Cough--suspect related to allergies and postnasal drip rather than any acute infection. Continue the zyrtec daily. Consider sinus rinses (sinus rinse kit or neti-pot). Add Mucinex (guaifenesin)--this is an expectorant to loosen the mucus/phlegm. Consider using either the DM version of mucinex, or getting the plain mucinex, and a separate delsym syrup, to use just if/when needed for cough  Rash appears unchanged.  I doubt it is ringworm given lack of improvement with the cream, although it should ideally have been used twice daily.  Let's try using Lamisil instead for 2-3 weeks.  If no change at all, then doubt it is fungal (not ringworm). The other condition that can mimic ringworm is granuloma annulare.  This is usually self-limited, and if not bothersome in appearance, treatment is not mandatory.

## 2015-06-07 ENCOUNTER — Other Ambulatory Visit: Payer: Self-pay | Admitting: Family Medicine

## 2015-07-12 ENCOUNTER — Other Ambulatory Visit: Payer: Self-pay | Admitting: Family Medicine

## 2015-08-07 ENCOUNTER — Other Ambulatory Visit: Payer: Self-pay | Admitting: Family Medicine

## 2015-10-16 ENCOUNTER — Ambulatory Visit (INDEPENDENT_AMBULATORY_CARE_PROVIDER_SITE_OTHER): Payer: BC Managed Care – PPO | Admitting: Family Medicine

## 2015-10-16 ENCOUNTER — Encounter: Payer: Self-pay | Admitting: Family Medicine

## 2015-10-16 VITALS — BP 120/76 | HR 72 | Ht 70.0 in | Wt 183.8 lb

## 2015-10-16 DIAGNOSIS — E781 Pure hyperglyceridemia: Secondary | ICD-10-CM

## 2015-10-16 DIAGNOSIS — K219 Gastro-esophageal reflux disease without esophagitis: Secondary | ICD-10-CM

## 2015-10-16 DIAGNOSIS — R21 Rash and other nonspecific skin eruption: Secondary | ICD-10-CM | POA: Diagnosis not present

## 2015-10-16 DIAGNOSIS — Z5181 Encounter for therapeutic drug level monitoring: Secondary | ICD-10-CM

## 2015-10-16 LAB — LIPID PANEL
Cholesterol: 165 mg/dL (ref 125–200)
HDL: 39 mg/dL — ABNORMAL LOW (ref >=40)
LDL CALC: 99 mg/dL (ref <130)
TRIGLYCERIDES: 137 mg/dL (ref <150)
Total CHOL/HDL Ratio: 4.2 Ratio (ref <=5.0)
VLDL: 27 mg/dL (ref <30)

## 2015-10-16 LAB — HEPATIC FUNCTION PANEL
ALK PHOS: 39 U/L — AB (ref 40–115)
ALT: 56 U/L — ABNORMAL HIGH (ref 9–46)
AST: 33 U/L (ref 10–40)
Albumin: 5 g/dL (ref 3.6–5.1)
BILIRUBIN INDIRECT: 0.5 mg/dL (ref 0.2–1.2)
BILIRUBIN TOTAL: 0.6 mg/dL (ref 0.2–1.2)
Bilirubin, Direct: 0.1 mg/dL (ref <=0.2)
Total Protein: 7.4 g/dL (ref 6.1–8.1)

## 2015-10-16 NOTE — Patient Instructions (Addendum)
Continue your current medications and lowfat diet. Try and take the second protonix rather than Tums in the evening.  Ideally it should be before dinner, but anytime in the evening is fine.   The rash on your hip/buttocks appears to be much lighter and flatter than in the past, although measures a little larger than in November.   At this point, I would continue to observe.  If any increase in size or redness or raised areas recur, then re-treat with antifungal such as clotrimazole or lamisil twice daily for 3 weeks, and then return for follow-up for biopsy if not resolving. If this is in fact granuloma annulare as we previously discussed (not fungal), it usually eventually gets better without treatment. Definitive diagnosis would be biopsy, but okay to hold off.

## 2015-10-16 NOTE — Progress Notes (Signed)
Chief Complaint  Patient presents with  . Hyperlipidemia    fasting med check.     Hypertriglyceridemia: taking fenofibrate regularly without side effects (upsets his stomach if taken without food--hasn't taken yet today).  Recent trip to [facility] (3/4-10), had more fatty foods. Otherwise, tries to follow a lowfat, low cholesterol diet.  He is taking 2 fish oil capsules twice daily.  GERD: on protonix, which seems to work well when he remembers to take it twice daily; some recurrent symptoms if misses the second dose. Uses Tums in the evening when he forgets the second protonix.  Denies dysphagia.  Rash at hips/upper buttocks--hard for patient to see.  Has been there since September.  Treated with antifungals (both lotrimin and lamisil) and did not notice any improvement.  Previously discussed full DDx including granuloma annulare.  He only occasionally notices a slight itch, when perspiring.  No known worsening of the rash.  PMH, PSH, SH reviewed  Outpatient Encounter Prescriptions as of 10/16/2015  Medication Sig Note  . calcium carbonate (TUMS - DOSED IN MG ELEMENTAL CALCIUM) 500 MG chewable tablet Chew 2 tablets by mouth as needed for indigestion or heartburn. 10/16/2015: In the evening  . cetirizine (ZYRTEC) 10 MG tablet Take 10 mg by mouth daily.   . fenofibrate 160 MG tablet TAKE 1 TABLET BY MOUTH EVERY DAY   . Multiple Vitamin (MULTIVITAMINS PO) Take by mouth daily.   04/15/2015: Takes daily   . OMEGA 3 1000 MG CAPS Take 1,000-2,000 mg by mouth daily.  04/15/2015: Takes 2 BID  . pantoprazole (PROTONIX) 40 MG tablet TAKE 1 TABLET (40 MG TOTAL) BY MOUTH 2 (TWO) TIMES DAILY BEFORE A MEAL. 10/16/2015: Supposed to be twice daily, often forgets the evening dose and takes Tums at night instead  . Probiotic Product ([place]) Take by mouth.   . Digestive Enzymes (PAPAYA AND ENZYMES PO) Take by mouth. Reported on 10/16/2015 01/07/2012: Takes as needed, if upset stomach  . ibuprofen  (ADVIL,MOTRIN) 200 MG tablet Take 200-400 mg by mouth as needed. Reported on 10/16/2015   . simethicone (MYLICON) 0000000 MG chewable tablet Chew 125 mg by mouth every 6 (six) hours as needed. Reported on 10/16/2015    No facility-administered encounter medications on file as of 10/16/2015.   No Known Allergies  ROS: no fever, chills, URI symptoms, headaches (some just today/yesterday), dizziness, chest pain, palpitations, nausea, vomiting, bowel changes, urinary complaints. No bleeding, bruising.  Rash as per HPI.  PHYSICAL EXAM: BP 120/76 mmHg  Pulse 72  Ht 5\' 10"  (1.778 m)  Wt 183 lb 12.8 oz (83.371 kg)  BMI 26.37 kg/m2  Well developed, pleasant, well-appearing male in no distress HEENT: PERRL, EOMI, conjunctiva and sclera are clear Neck: no lymphadenopathy, thyromegaly or carotid bruit Heart: regular rate and rhythm  Lungs: clear bilaterally. Skin: 6 x 3 cm are on right hip--improved--not even a full ring is visible, just some scattered pink areas that are flat. 7 x 4 cm left hip--the edges are very faint, not raised. There is a slightly darker red central lesion (making it appear like a target) and also one at 9 o'clock position of the ring.  ASSESSMENT/PLAN:  Hypertriglyceridemia - Plan: Lipid panel  Gastroesophageal reflux disease, esophagitis presence not specified - encouraged BID dosing of PPI rather than Tums at night  Medication monitoring encounter - Plan: Lipid panel, Hepatic function panel  Rash and nonspecific skin eruption   If TG/lipid panel good on this regimen, can switch to  checking just once yearly at his physicals.   The rash on your hip/buttocks appears to be much lighter and flatter than in the past, although measures a little larger than in November.   At this point, I would continue to observe.  If any increase in size or redness or raised areas recur, then re-treat with antifungal such as clotrimazole or lamisil twice daily for 3 weeks, and then return for  follow-up for biopsy if not resolving. If this is in fact granuloma annulare as we previously discussed (not fungal), it usually eventually gets better without treatment. Definitive diagnosis would be biopsy, but okay to hold off.

## 2015-11-02 ENCOUNTER — Other Ambulatory Visit: Payer: Self-pay | Admitting: Family Medicine

## 2015-11-26 ENCOUNTER — Ambulatory Visit (INDEPENDENT_AMBULATORY_CARE_PROVIDER_SITE_OTHER): Payer: BC Managed Care – PPO | Admitting: Medical

## 2015-11-26 ENCOUNTER — Encounter: Payer: Self-pay | Admitting: Medical

## 2015-11-26 VITALS — BP 120/88 | HR 86 | Temp 97.8°F | Resp 16 | Wt 183.0 lb

## 2015-11-26 DIAGNOSIS — J029 Acute pharyngitis, unspecified: Secondary | ICD-10-CM | POA: Diagnosis not present

## 2015-11-26 LAB — POCT RAPID STREP A (OFFICE): Rapid Strep A Screen: NEGATIVE

## 2015-11-26 NOTE — Progress Notes (Signed)
  Subjective: Jordan Stafford is a 45 y.o. male who presents for evaluation of sore throat.  Has had a few day hx/o sore throat, head congestion, but no cough, no NVD, no sinus pain, but has some ear pressure.  Denies fever.  Daughter had ear infection recently.  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   Has seasonal allergies, but this is an illness, feeling a little under the weather.   patient has not had a recent close exposure to someone with proven streptococcal pharyngitis.  Patient is not a smoker. No other aggravating or relieving factors.  No other c/o.  The following portions of the patient's history were reviewed and updated as appropriate: allergies, current medications, past medical history, past social history, past surgical history and problem list.  Past Medical History  Diagnosis Date  . Supraventricular tachycardia     Dr Caryl Comes  . Anxiety   . GERD (gastroesophageal reflux disease)   . IBS (irritable bowel syndrome)   . Seasonal allergies   . Renal stone   . Acne   . Hx of cardiovascular stress test     ETT-Myoview (08/2013):  No ischemia, EF 67%; normal study.    ROS as in subjective    Objective: BP 120/88 mmHg  Pulse 86  Temp(Src) 97.8 F (36.6 C) (Tympanic)  Resp 16  Wt 183 lb (83.008 kg)  General appearance: no distress, WD/WN, mildly ill-appearing HEENT: normocephalic, conjunctiva/corneas normal, sclerae anicteric, nares patent, no discharge or erythema, pharynx with erythema, no exudate.  Oral cavity: MMM, no lesions  Neck: supple, no lymphadenopathy, no thyromegaly Lungs: CTA bilaterally, no wheezes, rhonchi, or rales   Laboratory Strep test done. Results:negative.    Assessment: Encounter Diagnoses  Name Primary?  . Sore throat Yes  . Viral pharyngitis      Plan: discussed symptoms and exam suggestive of viral pharyngitis.  Discussed supportive care, preventing spread to others, usual time frame for symptoms to  resolve.  Call or return if worse or not improving by end of the week.

## 2015-11-27 ENCOUNTER — Encounter: Payer: Self-pay | Admitting: Medical

## 2015-12-18 ENCOUNTER — Other Ambulatory Visit: Payer: Self-pay | Admitting: Family Medicine

## 2016-03-25 ENCOUNTER — Other Ambulatory Visit: Payer: Self-pay | Admitting: Family Medicine

## 2016-04-13 NOTE — Progress Notes (Signed)
Chief Complaint  Patient presents with  . Annual Exam    fasting annual exam. Did not do eye exam, has one scheduled with Dr. Gershon Crane. No concerns. Has been having some b/l thumb pain off and on from time to time.     Jordan Stafford is a 45 y.o. male who presents for a complete physical.  He has the following concerns:  Intermittent bilateral thumb pain--when using his hands, once when grabbed by the thumb by his daughter.  Not persistent/worsening.  Hypertriglyceridemia: taking fenofibrate regularly without side effects (upsets his stomach if taken without food--hasn't taken yet today). He Tries to follow a lowfat, low cholesterol diet.  He is taking 2 fish oil capsules twice daily.  GERD: on protonix, which seems to work well when he remembers to take it twice daily; some recurrent symptoms if misses the second dose. Uses Tums in the evening when he forgets the second protonix. He didn't take it last night, but didn't snack or eat anything after 8pm, and he iddn't have any heartburn last night.  Denies dysphagia.  H/o elevated blood pressures--He checks his BP a few times/week in the evenings, runs <120/<80-85.  111/73 last night.  SVT--last saw cardiologist 01/2014. He reports that he was released, but he had planned to see him every other year. He has been off diltiazem for years, with no recurrences of SVT. Had normal stress test 08/2013. He has had no further episodes of SVT since 2008.He still gets occasional shortness of breath with going up 3 flights of stairs at work, only in the morning, just a couple of times/week.  He usually doesn't take his PPI until after he gets to work.  Rash at hips/upper buttocks-- Has been there at least since September 2016, also discussed at March 2017 visit.  Treated in the past with antifungals (both lotrimin and lamisil) and did not notice any improvement.  Previously discussed full DDx including granuloma annulare.  He only occasionally notices a  slight itch, when perspiring.  No known worsening of the rash. We elected to continue with observation, but if increase in size or redness or raised areas recur, then re-treat with antifungal such as clotrimazole or lamisil twice daily for 3 weeks, and then return for follow-up for biopsy if not resolving. Rash persists--it seems to have faded significantly, bottom portion of the ring has resolved, although the top portion seems larger, but lighter, per pt.  A little redder after a hot shower, not itchy.  Overall some improvement over the year.   Immunization History  Administered Date(s) Administered  . Influenza Split 05/27/2011  . Influenza,inj,Quad PF,36+ Mos 04/11/2014, 04/15/2015  . Tdap 07/25/2008   Last colonoscopy: never  Last PSA: never  Dentist: twice yearly  Ophtho: once yearly  Exercise: Zettie Pho and work around [employer].Walks the dog only when cool.  Chasing his 21 month old around 2 hours/day is really his only exercise.  Past Medical History:  Diagnosis Date  . Acne   . Anxiety   . GERD (gastroesophageal reflux disease)   . Hx of cardiovascular stress test    ETT-Myoview (08/2013):  No ischemia, EF 67%; normal study.  . IBS (irritable bowel syndrome)   . Renal stone   . Seasonal allergies   . Supraventricular tachycardia    Dr Caryl Comes    Past Surgical History:  Procedure Laterality Date  . MOUTH SURGERY     x3  . TOENAIL EXCISION     ingrown nail  Social History   Social History  . Marital status: Married    Spouse name: N/A  . Number of children: 1  . Years of education: N/A   Occupational History  . 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 Smoking status: Never Smoker  . Smokeless tobacco: Never Used  . Alcohol use No  . Drug use: No  . Sexual activity: Yes    Partners: Female   Other Topics Concern  . Not on file   Social History Narrative   Married 12/2011, has a cockapoo. Has a daughter, Bubba Hales  (born 09/02/14). 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    Family History  Problem Relation Age of Onset  . [place] White syndrome Mother   . Hypertension Mother   . COPD Mother     smoker  . Urolithiasis Mother   . Diabetes Father   . Hypertension Father   . Urolithiasis Father   . Cancer Father 72    renal  . Kidney cancer Father   . Heart disease Maternal Grandmother     MI at 55  . Cancer Paternal Grandfather     lung  . Heart disease Paternal Grandmother     late 51's  . Coronary artery disease Neg Hx     Outpatient Encounter Prescriptions as of 04/15/2016  Medication Sig Note  . calcium carbonate (TUMS - DOSED IN MG ELEMENTAL CALCIUM) 500 MG chewable tablet Chew 2 tablets by mouth as needed for indigestion or heartburn. Reported on 11/26/2015 10/16/2015: In the evening  . cetirizine (ZYRTEC) 10 MG tablet Take 10 mg by mouth daily.   . fenofibrate 160 MG tablet TAKE 1 TABLET BY MOUTH EVERY DAY   . Multiple Vitamin (MULTIVITAMINS PO) Take by mouth daily.   04/15/2015: Takes daily   . OMEGA 3 1000 MG CAPS Take 1,000-2,000 mg by mouth daily.  04/15/2015: Takes 2 BID  . pantoprazole (PROTONIX) 40 MG tablet TAKE 1 TABLET (40 MG TOTAL) BY MOUTH 2 (TWO) TIMES DAILY BEFORE A MEAL.   . Probiotic Product ([place]) Take by mouth.   . Digestive Enzymes (PAPAYA AND ENZYMES PO) Take by mouth. Reported on 11/26/2015 01/07/2012: Takes as needed, if upset stomach  . ibuprofen (ADVIL,MOTRIN) 200 MG tablet Take 200-400 mg by mouth as needed. Reported on 11/26/2015   . simethicone (MYLICON) 299 MG chewable tablet Chew 125 mg by mouth every 6 (six) hours as needed. Reported on 11/26/2015    No facility-administered encounter medications on file as of 04/15/2016.     No Known Allergies  ROS: The patient denies anorexia, fever, weight changes, headaches, vision loss, decreased hearing, ear pain, hoarseness, chest pain, dizziness, syncope, dyspnea on exertion, cough, swelling, nausea,  vomiting, constipation, abdominal pain, melena, hematochezia, indigestion/heartburn (only after certain foods in the evening, relieved by Tums), hematuria, incontinence, erectile dysfunction, nocturia, weakened urine stream, dysuria, genital lesions, joint pains (just thumbs intermittently), numbness, tingling, weakness, tremor, depression, abnormal bleeding/bruising, or enlarged lymph nodes  Some anxiety related to work stress--unchanged/stable.  Denies insomnia. Sleep is interrupted by young daughter. No recurrences of SVT Rash on buttock--improved, as per HPI Infrequent diarrhea, related to IBS He has noticed an very sensitive sense of smell   PHYSICAL EXAM:  BP 138/82 (BP Location: Left Arm, Patient Position: Sitting, Cuff Size: Normal)   Pulse 80   Ht 5' 9.25" (1.759 m)   Wt 183 lb 6.4 oz (83.2 kg)  BMI 26.89 kg/m   128/82 on repeat by MD  General Appearance:  Alert, cooperative, no distress, appears stated age, in good spirits  Head:  Normocephalic, without obvious abnormality, atraumatic   Eyes:  PERRL, conjunctiva/corneas clear, EOM's intact, fundi benign   Ears:  Normal TM's and external ear canals   Nose:  Nares normal, mucosa normal, no drainage or sinus tenderness   Throat:  Lips, mucosa, and tongue normal; teeth and gums normal   Neck:  Supple, no lymphadenopathy; thyroid: no enlargement/tenderness/nodules; no carotid bruit or JVD   Back:  Spine nontender, no curvature, ROM normal, no CVA tenderness   Lungs:  Clear to auscultation bilaterally without wheezes, rales or ronchi; respirations unlabored   Chest Wall:  No tenderness or deformity   Heart:  Regular rate and rhythm, S1 and S2 normal, no murmur, rub or gallop   Breast Exam:  No chest wall tenderness, masses or gynecomastia   Abdomen:  Soft, non-tender, nondistended, normoactive bowel sounds,  no masses, no hepatosplenomegaly   Genitalia:  Normal male external genitalia without  lesions. Testicles without masses. No inguinal hernias.   Rectal:  Normal sphincter tone, no masses or tenderness; guaiac negative stool. Prostate smooth, no nodules, not enlarged.   Extremities:  No clubbing, cyanosis or edema   Pulses:  2+ and symmetric all extremities   Skin:  Skin color, texture, turgor normal. Arched line measuring 10cm above the left buttock--light pink, not raised. No longer has inferior portion, just small scattered red area x 2 below the curved line R lateral buttock--faint, interrupted line measuring 7cm, also flat, faint pink.  Lymph nodes:  Cervical, supraclavicular, and axillary nodes normal   Neurologic:  CNII-XII intact, normal strength, sensation and gait; reflexes 2+ and symmetric throughout   Psych:  Normal mood, hygiene and grooming. Full range of affect, slightly anxious   ASSESSMENT/PLAN:  Routine general medical examination at a health care facility - Plan: POCT Urinalysis Dipstick, Comprehensive metabolic panel, Lipid panel  Hypertriglyceridemia - Plan: Comprehensive metabolic panel, Lipid panel  Gastroesophageal reflux disease, esophagitis presence not specified - controlled; discussed minimizing late night snacking, and then perhaps pm dose might not be needed regularly  Medication monitoring encounter - Plan: Comprehensive metabolic panel, Lipid panel  SVT/ PSVT/ PAT - no recurrent symptoms  ELEVATED BP READING WITHOUT DX HYPERTENSION - home numbers improved, borderline in office today, improved on repeat check. continue low sodium diet; encouraged more regular exercise, wt loss  Need for prophylactic vaccination and inoculation against influenza - Plan: Flu Vaccine QUAD 36+ mos PF IM (Fluarix & Fluzone Quad PF)   c-met, lipids  Consider taking your protonix before breakfast rather than with your other medications when you get to work. It would be interesting to see if you still have any of the shortness of breath and  discomfort with the stairs if you have the medication on board.  Try and exercise during naptime--that should help your sleep at night (by not napping during the day).  Recommended at least 30 minutes of aerobic activity at least 5 days/week, weight-bearing exercise 2-3x/wk; proper sunscreen use reviewed; healthy diet and alcohol recommendations (less than or equal to 2 drinks/day) reviewed; regular seatbelt use; changing batteries in smoke detectors. Self-testicular exams. Immunization recommendations discussed--flu shot today. Colonoscopy recommendations reviewed--age 30.  Counseled re: reflux and risks in detail. Consider EGD when he is 3 and going to colonoscopy, sooner if develops dysphagia or worsening reflux despite treatment.

## 2016-04-15 ENCOUNTER — Encounter: Payer: Self-pay | Admitting: Family Medicine

## 2016-04-15 ENCOUNTER — Ambulatory Visit (INDEPENDENT_AMBULATORY_CARE_PROVIDER_SITE_OTHER): Payer: BC Managed Care – PPO | Admitting: Family Medicine

## 2016-04-15 VITALS — BP 128/82 | HR 80 | Ht 69.25 in | Wt 183.4 lb

## 2016-04-15 DIAGNOSIS — Z23 Encounter for immunization: Secondary | ICD-10-CM

## 2016-04-15 DIAGNOSIS — R030 Elevated blood-pressure reading, without diagnosis of hypertension: Secondary | ICD-10-CM | POA: Diagnosis not present

## 2016-04-15 DIAGNOSIS — Z0000 Encounter for general adult medical examination without abnormal findings: Secondary | ICD-10-CM | POA: Diagnosis not present

## 2016-04-15 DIAGNOSIS — Z5181 Encounter for therapeutic drug level monitoring: Secondary | ICD-10-CM

## 2016-04-15 DIAGNOSIS — I471 Supraventricular tachycardia: Secondary | ICD-10-CM

## 2016-04-15 DIAGNOSIS — E781 Pure hyperglyceridemia: Secondary | ICD-10-CM | POA: Diagnosis not present

## 2016-04-15 DIAGNOSIS — K219 Gastro-esophageal reflux disease without esophagitis: Secondary | ICD-10-CM

## 2016-04-15 LAB — COMPREHENSIVE METABOLIC PANEL
ALT: 45 U/L (ref 9–46)
AST: 33 U/L (ref 10–40)
Albumin: 4.9 g/dL (ref 3.6–5.1)
Alkaline Phosphatase: 38 U/L — ABNORMAL LOW (ref 40–115)
BUN: 15 mg/dL (ref 7–25)
CHLORIDE: 104 mmol/L (ref 98–110)
CO2: 26 mmol/L (ref 20–31)
CREATININE: 1.05 mg/dL (ref 0.60–1.35)
Calcium: 9.6 mg/dL (ref 8.6–10.3)
Glucose, Bld: 104 mg/dL — ABNORMAL HIGH (ref 65–99)
Potassium: 4.2 mmol/L (ref 3.5–5.3)
SODIUM: 139 mmol/L (ref 135–146)
TOTAL PROTEIN: 7.5 g/dL (ref 6.1–8.1)
Total Bilirubin: 0.7 mg/dL (ref 0.2–1.2)

## 2016-04-15 LAB — POCT URINALYSIS DIPSTICK
Bilirubin, UA: NEGATIVE
Blood, UA: NEGATIVE
Glucose, UA: NEGATIVE
Ketones, UA: NEGATIVE
Leukocytes, UA: NEGATIVE
NITRITE UA: NEGATIVE
PROTEIN UA: NEGATIVE
SPEC GRAV UA: 1.025
UROBILINOGEN UA: NEGATIVE
pH, UA: 6

## 2016-04-15 LAB — LIPID PANEL
CHOLESTEROL: 144 mg/dL (ref 125–200)
HDL: 40 mg/dL (ref >=40)
LDL CALC: 76 mg/dL (ref <130)
Total CHOL/HDL Ratio: 3.6 Ratio (ref <=5.0)
Triglycerides: 141 mg/dL (ref <150)
VLDL: 28 mg/dL (ref <30)

## 2016-04-15 NOTE — Patient Instructions (Addendum)
  HEALTH MAINTENANCE RECOMMENDATIONS:  It is recommended that you get at least 30 minutes of aerobic exercise at least 5 days/week (for weight loss, you may need as much as 60-90 minutes). This can be any activity that gets your heart rate up. This can be divided in 10-15 minute intervals if needed, but try and build up your endurance at least once a week.  Weight bearing exercise is also recommended twice weekly.  Eat a healthy diet with lots of vegetables, fruits and fiber.  "Colorful" foods have a lot of vitamins (ie green vegetables, tomatoes, red peppers, etc).  Limit sweet tea, regular sodas and alcoholic beverages, all of which has a lot of calories and sugar.  Up to 2 alcoholic drinks daily may be beneficial for men (unless trying to lose weight, watch sugars).  Drink a lot of water.  Sunscreen of at least SPF 30 should be used on all sun-exposed parts of the skin when outside between the hours of 10 am and 4 pm (not just when at beach or pool, but even with exercise, golf, tennis, and yard work!)  Use a sunscreen that says "broad spectrum" so it covers both UVA and UVB rays, and make sure to reapply every 1-2 hours.  Remember to change the batteries in your smoke detectors when changing your clock times in the spring and fall.  Use your seat belt every time you are in a car, and please drive safely and not be distracted with cell phones and texting while driving.  Consider taking your protonix before breakfast rather than with your other medications when you get to work. It would be interesting to see if you still have any of the shortness of breath and discomfort with the stairs if you have the medication on board.  Try and exercise during naptime--that should help your sleep at night (by not napping during the day).

## 2016-04-30 ENCOUNTER — Other Ambulatory Visit: Payer: Self-pay | Admitting: Family Medicine

## 2016-07-14 ENCOUNTER — Other Ambulatory Visit: Payer: Self-pay | Admitting: Family Medicine

## 2016-09-05 ENCOUNTER — Other Ambulatory Visit: Payer: Self-pay | Admitting: Family Medicine

## 2016-10-07 ENCOUNTER — Other Ambulatory Visit: Payer: Self-pay | Admitting: Family Medicine

## 2016-10-11 NOTE — Progress Notes (Signed)
Chief Complaint  Patient presents with  . Hyperlipidemia    fasting med check.     Hypertriglyceridemia: taking fenofibrate regularly without side effects (upsets his stomach if taken without food--hasn't taken yet today). He tries to follow a lowfat, low cholesterol diet, although admits to noncompliance earlier this month when in [place]. He is taking 2 fish oil capsules twice daily.   GERD: on protonix--he mainly takes it once daily with breakfast, uses it twice daily prn for recurrent symptoms. Only rarely needs Tums in the evenings. Denies dysphagia. Overall this has improved, needing Protonix mostly once daily (previously had PM symptoms when not taken BID). No longer has significant shortness of breath when running up the stairs (just sporadically with the first flight in the morning, not daily).  Not getting regular exercise.  H/o elevated blood pressures--He checks BP at home sporadically, not recent.  Is always fine at home (2x/month), 120's/70's to low 80's.  Fasting glucose was elevated at 104 at his physical.  Due to recheck.  SVT--last saw cardiologist 01/2014. He reports that he was released, but he had planned to see him every other year. He has been off diltiazem for years, with no recurrences of SVT. Had normal stress test 08/2013. He has had no further episodes of SVT since 2008.  Rash at hips/upper buttocks--Has been there at least since September 2016. Treated in the past with antifungals (both lotrimin and lamisil) and did not notice any improvement. Previously discussed full DDx including granuloma annulare. He only occasionally notices a slight itch, when perspiring. No known worsening of the rash. We elected to continue with observation, but if increase in size or redness or raised areas recur, then re-treat with antifungal such as clotrimazole or lamisil twice daily for 3 weeks, and then return for follow-up for biopsy if not resolving. Today he reports that  the rash seemed to clear up, and almost completely went away, but then it came back, in a slightly higher location. Redder after showering. Rarely is slightly itchy, if he is very hot.  PMH, PSH, SH reviewed  Outpatient Encounter Prescriptions as of 10/12/2016  Medication Sig Note  . calcium carbonate (TUMS - DOSED IN MG ELEMENTAL CALCIUM) 500 MG chewable tablet Chew 2 tablets by mouth as needed for indigestion or heartburn. Reported on 11/26/2015 10/16/2015: In the evening  . cetirizine (ZYRTEC) 10 MG tablet Take 10 mg by mouth daily.   . Digestive Enzymes (PAPAYA AND ENZYMES PO) Take by mouth. Reported on 11/26/2015 01/07/2012: Takes as needed, if upset stomach  . fenofibrate 160 MG tablet Take 1 tablet (160 mg total) by mouth daily.   Marland Kitchen ibuprofen (ADVIL,MOTRIN) 200 MG tablet Take 200-400 mg by mouth as needed. Reported on 11/26/2015   . Multiple Vitamin (MULTIVITAMINS PO) Take by mouth daily.   04/15/2015: Takes daily   . OMEGA 3 1000 MG CAPS Take 1,000-2,000 mg by mouth daily.  04/15/2015: Takes 2 BID  . pantoprazole (PROTONIX) 40 MG tablet TAKE 1 TABLET (40 MG TOTAL) BY MOUTH 2 (TWO) TIMES DAILY BEFORE A MEAL.   . Probiotic Product ([place]) Take by mouth.   . simethicone (MYLICON) 161 MG chewable tablet Chew 125 mg by mouth every 6 (six) hours as needed. Reported on 11/26/2015   . [DISCONTINUED] fenofibrate 160 MG tablet TAKE 1 TABLET BY MOUTH EVERY DAY    No facility-administered encounter medications on file as of 10/12/2016.    No Known Allergies  ROS: no fever, URI symptoms,  chest pain, palpitations, shortness of breath, GI symptoms (see HPI for reflux), bleeding, bruising. Rash on right buttock per HPI, no other skin concerns.  Moods normal, no joint pains or other concerns 4# weight gain (recent [place] trip).    PHYSICAL EXAM:  BP 130/90 (BP Location: Left Arm, Patient Position: Sitting, Cuff Size: Normal)   Pulse 72   Ht 5' 9.25" (1.759 m)   Wt 187 lb (84.8 kg)   BMI  27.42 kg/m    Wt Readings from Last 3 Encounters:  04/15/16 183 lb 6.4 oz (83.2 kg)  11/26/15 183 lb (83 kg)  10/16/15 183 lb 12.8 oz (83.4 kg)   140/92 on repeat by MD  Well appearing, pleasant male in no distress HEENT: conjunctiva and sclera are clear, OP clear Neck: no lymphadenopathy or mass Heart: regular rate and rhythm Lungs: clear bilaterally Abdomen: soft, nontender, no mass Extremities: no edema Psych: normal mood, affect, hygiene and grooming Neuro: alert and oriented, cranial nerves intact, normal gait Skin: normal turgor. 3 very faint areas of erythema at the upper right buttock--not contiguous, flat. Significantly improved from prior exam  ASSESSMENT/PLAN:   Hypertriglyceridemia - reviewed proper diet - Plan: Hepatic function panel, Lipid panel, fenofibrate 160 MG tablet  Gastroesophageal reflux disease, esophagitis presence not specified - stable/iimproved  SVT/ PSVT/ PAT - doing well, asymptomatic  Medication monitoring encounter - Plan: Hepatic function panel  Impaired fasting glucose - reviewed proper diet; encouraged daily exercise - Plan: Glucose, random, Hemoglobin A1c  ELEVATED BP READING WITHOUT DX HYPERTENSION - discussed low sodium diet, daily exercise, continuing to monitor at home     Please check your blood pressure at home and ensure that it is running low 130's/low 80's at the highest.  If running higher, be sure to cut back on sodium in the diet, start getting regular exercise, and working on losing some of the weight that you gained.  Goal is 150 minutes each week of aerobic exercise.

## 2016-10-12 ENCOUNTER — Ambulatory Visit (INDEPENDENT_AMBULATORY_CARE_PROVIDER_SITE_OTHER): Payer: BC Managed Care – PPO | Admitting: Family Medicine

## 2016-10-12 ENCOUNTER — Encounter: Payer: Self-pay | Admitting: Family Medicine

## 2016-10-12 VITALS — BP 130/90 | HR 72 | Ht 69.25 in | Wt 187.0 lb

## 2016-10-12 DIAGNOSIS — R030 Elevated blood-pressure reading, without diagnosis of hypertension: Secondary | ICD-10-CM | POA: Diagnosis not present

## 2016-10-12 DIAGNOSIS — I471 Supraventricular tachycardia: Secondary | ICD-10-CM | POA: Diagnosis not present

## 2016-10-12 DIAGNOSIS — Z5181 Encounter for therapeutic drug level monitoring: Secondary | ICD-10-CM

## 2016-10-12 DIAGNOSIS — R7301 Impaired fasting glucose: Secondary | ICD-10-CM | POA: Diagnosis not present

## 2016-10-12 DIAGNOSIS — E781 Pure hyperglyceridemia: Secondary | ICD-10-CM | POA: Diagnosis not present

## 2016-10-12 DIAGNOSIS — K219 Gastro-esophageal reflux disease without esophagitis: Secondary | ICD-10-CM | POA: Diagnosis not present

## 2016-10-12 DIAGNOSIS — Z0000 Encounter for general adult medical examination without abnormal findings: Secondary | ICD-10-CM

## 2016-10-12 LAB — HEPATIC FUNCTION PANEL
ALT: 67 U/L — AB (ref 9–46)
AST: 40 U/L (ref 10–40)
Albumin: 4.9 g/dL (ref 3.6–5.1)
Alkaline Phosphatase: 46 U/L (ref 40–115)
BILIRUBIN DIRECT: 0.1 mg/dL (ref <=0.2)
BILIRUBIN INDIRECT: 0.5 mg/dL (ref 0.2–1.2)
BILIRUBIN TOTAL: 0.6 mg/dL (ref 0.2–1.2)
Total Protein: 7.6 g/dL (ref 6.1–8.1)

## 2016-10-12 LAB — GLUCOSE, RANDOM: Glucose, Bld: 80 mg/dL (ref 65–99)

## 2016-10-12 LAB — LIPID PANEL
CHOL/HDL RATIO: 4.2 ratio (ref <5.0)
CHOLESTEROL: 158 mg/dL (ref <200)
HDL: 38 mg/dL — ABNORMAL LOW (ref >40)
LDL CALC: 84 mg/dL (ref <100)
Triglycerides: 182 mg/dL — ABNORMAL HIGH (ref <150)
VLDL: 36 mg/dL — AB (ref <30)

## 2016-10-12 MED ORDER — FENOFIBRATE 160 MG PO TABS: 160.0000 mg | ORAL_TABLET | Freq: Every day | ORAL | 5 refills | Status: DC

## 2016-10-12 NOTE — Patient Instructions (Signed)
  Please check your blood pressure at home and ensure that it is running low 130's/low 80's at the highest.  If running higher, be sure to cut back on sodium in the diet, start getting regular exercise, and working on losing some of the weight that you gained.  Goal is 150 minutes each week of aerobic exercise.

## 2016-10-13 LAB — HEMOGLOBIN A1C
HEMOGLOBIN A1C: 5.1 % (ref <5.7)
MEAN PLASMA GLUCOSE: 100 mg/dL

## 2016-10-15 ENCOUNTER — Ambulatory Visit (INDEPENDENT_AMBULATORY_CARE_PROVIDER_SITE_OTHER): Payer: BC Managed Care – PPO | Admitting: Family Medicine

## 2016-10-15 ENCOUNTER — Encounter: Payer: Self-pay | Admitting: Family Medicine

## 2016-10-15 VITALS — BP 128/88 | Ht 69.25 in | Wt 186.6 lb

## 2016-10-15 DIAGNOSIS — R7989 Other specified abnormal findings of blood chemistry: Secondary | ICD-10-CM

## 2016-10-15 DIAGNOSIS — E781 Pure hyperglyceridemia: Secondary | ICD-10-CM

## 2016-10-15 DIAGNOSIS — R030 Elevated blood-pressure reading, without diagnosis of hypertension: Secondary | ICD-10-CM | POA: Diagnosis not present

## 2016-10-15 DIAGNOSIS — R945 Abnormal results of liver function studies: Secondary | ICD-10-CM

## 2016-10-15 NOTE — Patient Instructions (Signed)
Continue your current medication, lowfat diet. See you in 6 months, with labs prior to your visit.

## 2016-10-15 NOTE — Progress Notes (Signed)
Chief Complaint  Patient presents with  . Follow-up    here to follow up on labs, concerned more so about liver panel.    Patient presents accompanied by his wife to discuss his recent lab results.  He is concerned about the liver tests, and whether the medication is causing it to be elevated.  He doesn't drink alcohol, doesn't take Tylenol.  He brings in his [employer] wrist monitor.  He reports his BP's have been low at home the last few nights 120/84, 117/80, 106/74.  PMH, PSH, SH, Meds/allergies reviewed  ROS: no headaches, dizziness, URI symptoms, chest pain, myalgias or other concerns.    PHYSICAL EXAM: BP 128/88 (BP Location: Left Arm, Patient Position: Sitting, Cuff Size: Normal)   Ht 5' 9.25" (1.759 m)   Wt 186 lb 9.6 oz (84.6 kg)   BMI 27.36 kg/m   Well appearing, mildly anxious male in no distress 135/96 wrist monitor, left arm, resting on chest 133/94 with wrist monitor with arm extended (as per instruction for monitor, per pt) 126/86 by MD, Left arm  Lab Results  Component Value Date   ALT 67 (H) 10/12/2016   AST 40 10/12/2016   ALKPHOS 46 10/12/2016   BILITOT 0.6 10/12/2016   Lab Results  Component Value Date   CHOL 158 10/12/2016   HDL 38 (L) 10/12/2016   LDLCALC 84 10/12/2016   TRIG 182 (H) 10/12/2016   CHOLHDL 4.2 10/12/2016   Lab Results  Component Value Date   HGBA1C 5.1 10/12/2016   ASSESSMENT/PLAN:  Hypertriglyceridemia  ELEVATED BP READING WITHOUT DX HYPERTENSION  Elevated LFTs    Discussed differential dx of elevated LFT's, potential causes (meds, fatty liver, relation to triglycerides, weight).  Many years worth of labs reviewed--AST/ALT have been higher since being on fenofibrate, but only intermittently elevated.  TG had been >500 prior to starting medication.   He had poor diet shortly prior to labs, with recent trip to [place] with poor food choices and over-eating.  I advised pt and his wife that I'm not particularly concerned about  these values, or that the  Medication is causing any liver damage.  Encouraged him to stay on the medication, follow lowfat diet, exercise regularly and lose weight (both of which will also help his borderline BP's). We will recheck in 6 months, as planned. Offered sooner recheck, but he is okay with my plan.    Other options presented today (not my recommendation) would be a trial of lower fenofibrate dose to see if LFT's increase or decrease as TG levels may change.  This may be an option at f/u, depending on results.  If TG improve with dietary changes, perhaps lower dose would be adequate.  His BP monitor was a little off--reading high here in the clinic, but great values at home.  His wife is a Marine scientist, has a manual monitor.  She will check that her monitor is adequately calibrated (discussed how to check), and periodically check at home.  f/u in 6 mos with labs prior, as planned. 25-30 min visit, more than 1/2 spent counseling.

## 2016-11-16 ENCOUNTER — Encounter: Payer: Self-pay | Admitting: Family Medicine

## 2016-12-22 ENCOUNTER — Encounter: Payer: Self-pay | Admitting: Family Medicine

## 2016-12-22 ENCOUNTER — Ambulatory Visit (INDEPENDENT_AMBULATORY_CARE_PROVIDER_SITE_OTHER): Payer: BC Managed Care – PPO | Admitting: Family Medicine

## 2016-12-22 VITALS — BP 130/88 | HR 106 | Temp 99.1°F | Wt 186.0 lb

## 2016-12-22 DIAGNOSIS — J029 Acute pharyngitis, unspecified: Secondary | ICD-10-CM | POA: Diagnosis not present

## 2016-12-22 DIAGNOSIS — B9789 Other viral agents as the cause of diseases classified elsewhere: Secondary | ICD-10-CM

## 2016-12-22 DIAGNOSIS — J069 Acute upper respiratory infection, unspecified: Secondary | ICD-10-CM | POA: Diagnosis not present

## 2016-12-22 LAB — POCT RAPID STREP A (OFFICE): Rapid Strep A Screen: NEGATIVE

## 2016-12-22 NOTE — Progress Notes (Signed)
   Subjective:    Patient ID: Jordan Stafford, male    DOB: 07-22-1971, 46 y.o.   MRN: [id number]  HPI He complains of a three-day history of started with sore throat followed by cough, PND, myalgias and fatigue. No earache, fever, chills. He does not smoke.   Review of Systems     Objective:   Physical Exam Alert and in no distress. Tympanic membranes and canals are normal. Pharyngeal area is normal. Neck is supple without adenopathy or thyromegaly. Cardiac exam shows a regular sinus rhythm without murmurs or gallops. Lungs are clear to auscultation. Strep test negative       Assessment & Plan:  Sore throat - Plan: POCT rapid strep A  Viral URI with cough Recommend supportive care. If the symptoms last more than 7-10 days, he is to call back.

## 2016-12-22 NOTE — Patient Instructions (Signed)
Robitussin-DM during the day and NyQuil at night

## 2016-12-23 ENCOUNTER — Telehealth: Payer: Self-pay | Admitting: Family Medicine

## 2016-12-23 NOTE — Telephone Encounter (Signed)
Addressed with patient via MyChart

## 2016-12-23 NOTE — Telephone Encounter (Signed)
Pt called & states he has now developed a rash on his neck, he will try and upload a picture on Mychart, Also he just found out a co worker that he was in close contact with was diagnosed last night in the ER with the flu.  Advised you have no appointments left today but will send you a message.

## 2016-12-28 ENCOUNTER — Ambulatory Visit (INDEPENDENT_AMBULATORY_CARE_PROVIDER_SITE_OTHER): Payer: BC Managed Care – PPO | Admitting: Family Medicine

## 2016-12-28 ENCOUNTER — Telehealth: Payer: Self-pay | Admitting: Family Medicine

## 2016-12-28 ENCOUNTER — Encounter: Payer: Self-pay | Admitting: Family Medicine

## 2016-12-28 VITALS — BP 126/76 | HR 80 | Temp 98.7°F | Ht 69.25 in | Wt 184.4 lb

## 2016-12-28 DIAGNOSIS — J0100 Acute maxillary sinusitis, unspecified: Secondary | ICD-10-CM | POA: Diagnosis not present

## 2016-12-28 DIAGNOSIS — H1031 Unspecified acute conjunctivitis, right eye: Secondary | ICD-10-CM

## 2016-12-28 DIAGNOSIS — R05 Cough: Secondary | ICD-10-CM | POA: Diagnosis not present

## 2016-12-28 DIAGNOSIS — R059 Cough, unspecified: Secondary | ICD-10-CM

## 2016-12-28 MED ORDER — AMOXICILLIN 500 MG PO TABS: 1000.0000 mg | ORAL_TABLET | Freq: Two times a day (BID) | ORAL | 0 refills | Status: DC

## 2016-12-28 MED ORDER — HYDROCODONE-HOMATROPINE 5-1.5 MG/5ML PO SYRP: 5.0000 mL | ORAL_SOLUTION | Freq: Three times a day (TID) | ORAL | 0 refills | Status: DC | PRN

## 2016-12-28 MED ORDER — PANTOPRAZOLE SODIUM 40 MG PO TBEC: DELAYED_RELEASE_TABLET | ORAL | 0 refills | Status: DC

## 2016-12-28 NOTE — Telephone Encounter (Signed)
Done

## 2016-12-28 NOTE — Progress Notes (Signed)
Chief Complaint  Patient presents with  . Follow-up    right eye little bit itchy and red, some drainage. Still coughing, has worsened-cannot sleep at all. Low grade temps.    Seen 6 days ago with a 3d h/o sore throat, PND, myalgias, fatigue.  Rash started the following day on his neck/shoulders, lasting 1-2 days.  It was not pruritic, just red/splotchy.  Cough was mild to start, but 3 days ago it got much worse, anytime he lays down.  Cough is better when sitting up.  Not getting much nasal drainage, but feels congested in his head.  Denies ear pain, but right ear has been plugging.  No tinnitus or vertigo.  Unable to blow anything out from his nose.  Also coughs more when he leans forward (ie to pick something off the floor).  He is sore in his chest from coughing.  Denies any shortness of breath.  Occasionally gets up a small amount of phlegm (yellowish, light); mostly cough is dry.,  Robitussin DM didn't help much. Called over the weekend, and Dr. Redmond School suggested Nyquil.  It just made him drowsy, but didn't stop the coughing.  Still having some intermittent low grade fevers.  Last dose of tylenol was last night. Myalgias resolved.  Wife noted his right eye to be red when she got home from work last night.  He felt like he may have something in the eye, had rubbed some.  Noted some mucus draining from the right eye last night. Eye isn't itchy, no crusting.  Denies any foreign body--not doing yardwork.  Symptoms started while playing a video game. Eye was watering more last night, not much this morning.  Still has decreased energy/fatigue. No sick contacts.  Reflux is well controlled, hasn't even needed Tums.  PMH, PSH ,SH reviewed  Outpatient Encounter Prescriptions as of 12/28/2016  Medication Sig Note  . calcium carbonate (TUMS - DOSED IN MG ELEMENTAL CALCIUM) 500 MG chewable tablet Chew 2 tablets by mouth as needed for indigestion or heartburn. Reported on 11/26/2015 10/16/2015: In the  evening  . cetirizine (ZYRTEC) 10 MG tablet Take 10 mg by mouth daily.   . Digestive Enzymes (PAPAYA AND ENZYMES PO) Take by mouth. Reported on 11/26/2015 01/07/2012: Takes as needed, if upset stomach  . fenofibrate 160 MG tablet Take 1 tablet (160 mg total) by mouth daily.   . Multiple Vitamin (MULTIVITAMINS PO) Take by mouth daily.   04/15/2015: Takes daily   . OMEGA 3 1000 MG CAPS Take 1,000-2,000 mg by mouth daily.  04/15/2015: Takes 2 BID  . pantoprazole (PROTONIX) 40 MG tablet TAKE 1 TABLET (40 MG TOTAL) BY MOUTH 2 (TWO) TIMES DAILY BEFORE A MEAL.   . Probiotic Product ([place]) Take by mouth.   Marland Kitchen ibuprofen (ADVIL,MOTRIN) 200 MG tablet Take 200-400 mg by mouth as needed. Reported on 11/26/2015   . simethicone (MYLICON) 950 MG chewable tablet Chew 125 mg by mouth every 6 (six) hours as needed. Reported on 11/26/2015    No facility-administered encounter medications on file as of 12/28/2016.    No Known Allergies   ROS:  No nausea, vomiting, diarrhea.  No heartburn. Skin rash resolved. Myalgias resolved.  Some chest soreness from coughing. No ear pain, sore throat. +cough  PHYSICAL EXAM:  BP 126/76   Pulse 80   Temp 98.7 F (37.1 C) (Tympanic)   Ht 5' 9.25" (1.759 m)   Wt 184 lb 6.4 oz (83.6 kg)   BMI 27.03 kg/m  Wt Readings from Last 3 Encounters:  12/28/16 184 lb 6.4 oz (83.6 kg)  12/22/16 186 lb (84.4 kg)  10/15/16 186 lb 9.6 oz (84.6 kg)    Well appearing male, accompanied by his wife, in no distress. Rare dry cough during visit HEENT: conjunctiva is mildly injected diffusely in the right eye. No watering, crusting or soft tissue swelling.  Left eye was normal. TM's and EAC's normal. Nasal mucosa is mildly edematous. There is clear-white mucus on the right, light yellow mucus and some green crusting on the left.  OP exam is notable for some thick white drainage hung up on the posterior OP behind the uvula, midline.  Otherwise OP is normal, moist mucus  membranes Sinuses--mildly tender over right maxillary sinus. Neck: no lymphadenopathy Heart: regular rate and rhythm, no murmur Lungs: clear bilaterally.  Good air movement. No wheezes, rales, ronchi Skin: normal turgor, no rash Psych: normal mood, affect, hygiene and grooming Neuro: alert and oriented, cranial nerves intact, normal gait.   ASSESSMENT/PLAN:  Acute non-recurrent maxillary sinusitis - Plan: amoxicillin (AMOXIL) 500 MG tablet  Cough - Plan: HYDROcodone-homatropine (HYCODAN) 5-1.5 MG/5ML syrup  Acute conjunctivitis of right eye, unspecified acute conjunctivitis type  Counseled re: risks/side effects of medications, what to expect, when to follow up, if needed.   Drink a lot of water/fluids. Change from robitussin DM to Mucinex 12 hours.  You can use delsym syrup during the day, if needed for cough.,  Use the hycodan syrup at bedtime, if needed.  This can cause drowsiness, be careful with early morning driving. Use sinus rinses to help flush out the thick mucus.  You can try either Sinus Rinse kit or Neti-pot.   Take the antibiotics as directed.  Contact us in 10 days if not better (sooner if worse--ie high fevers, worsening symptoms) If persistent cough, chest pressure, fevers, trouble laying flat, x-ray may be needed.  Currently your lungs are completely clear, and I feel like your cough is coming from the postnasal drainage that I can actually see in your throat. Let us know if your eye redness worsens, if there is any crusting, pain, changes in your vision

## 2016-12-28 NOTE — Telephone Encounter (Signed)
Recv'd fax from [facility] for refill Pantoprazole 40mg  #180 1 tablet bid to [facility] [place]

## 2016-12-28 NOTE — Patient Instructions (Signed)
  Drink a lot of water/fluids. Change from robitussin DM to Mucinex 12 hours.  You can use delsym syrup during the day, if needed for cough.,  Use the hycodan syrup at bedtime, if needed.  This can cause drowsiness, be careful with early morning driving. Use sinus rinses to help flush out the thick mucus.  You can try either Sinus Rinse kit or Neti-pot.   Take the antibiotics as directed.  Contact us in 10 days if not better (sooner if worse--ie high fevers, worsening symptoms) If persistent cough, chest pressure, fevers, trouble laying flat, x-ray may be needed.  Currently your lungs are completely clear, and I feel like your cough is coming from the postnasal drainage that I can actually see in your throat. Let us know if your eye redness worsens, if there is any crusting, pain, changes in your vision

## 2017-01-03 ENCOUNTER — Encounter: Payer: Self-pay | Admitting: Family Medicine

## 2017-01-12 NOTE — Progress Notes (Signed)
Chief Complaint  Patient presents with  . Cough    ongoing cough. No discolored mucus.   Patient presents with complaint of ongoing cough. He was recently treated with amoxil for sinusitis.  He admits that he made an error and took only 500mg  BID for the first 5 days, rather than 1000mg  BID.  At that point he said that he was feeling somewhat better, sleeping through the night, and improved cough during the day (less frequent and less intense), not needing to use OTC cough medications. He had reported 6/10 that he was able to mow the lawn without making it worse.  Cough never completely resolved; cough has gradually gotten worse.  He has spasms of coughs--he can feel drainage in his throat, but cough isn't productive. Some ear popping.  No sinus pain.  He was able to get up phlegm after a coughing spell--clear and light yellow in color 2 nights ago.  He hasn't taken any mucinex other than the first night after he was here last, 6/4--he took that along with the hycodan and felt like his cough was worse that night.  Hasn't taken mucinex since.  He took the hycodan for a couple of nights, which helped, and hasn't needed it since, except for the cough being worse last night. Took Delsym last night, which helped some.   PMH, PSH, SH reviewed  Outpatient Encounter Prescriptions as of 01/13/2017  Medication Sig Note  . calcium carbonate (TUMS - DOSED IN MG ELEMENTAL CALCIUM) 500 MG chewable tablet Chew 2 tablets by mouth as needed for indigestion or heartburn. Reported on 11/26/2015 10/16/2015: In the evening  . cetirizine (ZYRTEC) 10 MG tablet Take 10 mg by mouth daily.   Marland Kitchen dextromethorphan (DELSYM) 30 MG/5ML liquid Take 15 mg by mouth as needed for cough. 01/13/2017: Took last night  . fenofibrate 160 MG tablet Take 1 tablet (160 mg total) by mouth daily.   . Multiple Vitamin (MULTIVITAMINS PO) Take by mouth daily.   04/15/2015: Takes daily   . OMEGA 3 1000 MG CAPS Take 1,000-2,000 mg by mouth daily.   04/15/2015: Takes 2 BID  . pantoprazole (PROTONIX) 40 MG tablet TAKE 1 TABLET (40 MG TOTAL) BY MOUTH 2 (TWO) TIMES DAILY BEFORE A MEAL.   . Probiotic Product ([place]) Take by mouth.   . Digestive Enzymes (PAPAYA AND ENZYMES PO) Take by mouth. Reported on 11/26/2015 01/07/2012: Takes as needed, if upset stomach  . HYDROcodone-homatropine (HYCODAN) 5-1.5 MG/5ML syrup Take 5 mLs by mouth every 8 (eight) hours as needed for cough. (Patient not taking: Reported on 01/13/2017)   . ibuprofen (ADVIL,MOTRIN) 200 MG tablet Take 200-400 mg by mouth as needed. Reported on 11/26/2015   . predniSONE (DELTASONE) 20 MG tablet Take 1 tablet (20 mg total) by mouth 2 (two) times daily with a meal.   . simethicone (MYLICON) 213 MG chewable tablet Chew 125 mg by mouth every 6 (six) hours as needed. Reported on 11/26/2015   . [DISCONTINUED] amoxicillin (AMOXIL) 500 MG tablet Take 2 tablets (1,000 mg total) by mouth 2 (two) times daily.    No facility-administered encounter medications on file as of 01/13/2017.    (prednisone rx'd today, not taking prior to visit).  No Known Allergies  ROS: Denies fever, chills, nausea, diarrhea.  Stool slightly loose just today. Throat is a little irritated. No sinus pain. No shortness of breath. No bleeding, bruising, rash, joint pains or other concerns. No headache, chest pain, palpitations.  See HPI.  PHYSICAL EXAM:  BP (!) 140/100 (BP Location: Left Arm, Patient Position: Sitting, Cuff Size: Normal)   Pulse 84   Temp 98.7 F (37.1 C) (Tympanic)   Ht 5' 9.25" (1.759 m)   Wt 186 lb (84.4 kg)   BMI 27.27 kg/m   144/96 on repeat by MD  Well appearing male with episodic dry cough.  Speaking easily in full sentences. HEENT: PERRL, EOMI, conjunctiva and sclera clear.  Nasal mucosa is moderately edematous with clear mucus on the right. No erythema. Sinuses are nontender. OP is clear, tonsils are mildly enlarged, no erythema or exudate.   Neck: no  lymphadenopathy Heart: regular rate and rhythm, no murmur Lungs clear bilaterally, good air movement Extremities: no edema Neuro: alert and oriented, cranial nerves intact, normal gait Psych: normal mood, affect, hygiene and grooming   ASSESSMENT/PLAN:  Cough - no e/o ongoing sinus infection, but +ongoing PND/allergies contributing to cough. Short steroid course; mucinex and delsym prn - Plan: predniSONE (DELTASONE) 20 MG tablet   Drink plenty of water. Take the prednisone twice daily for 5 days.  (if significant side effects, you can either take both tablets in the morning, or decrease the dose to just 1/day). You may continue to use delsym syrup if needed for cough suppression. I do think there is a component of postnasal drainage contributing to your cough.  The prednisone should help with that.  You may also benefit from the mucinex for a couple of days to help loosen up what may already be in the chest.  Call or return if you develop fever, discolored mucus, shortness of breath or other concerns.

## 2017-01-13 ENCOUNTER — Ambulatory Visit (INDEPENDENT_AMBULATORY_CARE_PROVIDER_SITE_OTHER): Payer: BC Managed Care – PPO | Admitting: Family Medicine

## 2017-01-13 ENCOUNTER — Encounter: Payer: Self-pay | Admitting: Family Medicine

## 2017-01-13 VITALS — BP 140/100 | HR 84 | Temp 98.7°F | Ht 69.25 in | Wt 186.0 lb

## 2017-01-13 DIAGNOSIS — R05 Cough: Secondary | ICD-10-CM

## 2017-01-13 DIAGNOSIS — R059 Cough, unspecified: Secondary | ICD-10-CM

## 2017-01-13 MED ORDER — PREDNISONE 20 MG PO TABS: 20.0000 mg | ORAL_TABLET | Freq: Two times a day (BID) | ORAL | 0 refills | Status: DC

## 2017-01-13 NOTE — Patient Instructions (Signed)
  Drink plenty of water. Take the prednisone twice daily for 5 days.  (if significant side effects, you can either take both tablets in the morning, or decrease the dose to just 1/day). You may continue to use delsym syrup if needed for cough suppression. I do think there is a component of postnasal drainage contributing to your cough.  The prednisone should help with that.  You may also benefit from the mucinex for a couple of days to help loosen up what may already be in the chest.  Call or return if you develop fever, discolored mucus, shortness of breath or other concerns.

## 2017-04-12 ENCOUNTER — Other Ambulatory Visit (INDEPENDENT_AMBULATORY_CARE_PROVIDER_SITE_OTHER): Payer: BC Managed Care – PPO

## 2017-04-12 DIAGNOSIS — Z23 Encounter for immunization: Secondary | ICD-10-CM

## 2017-04-22 ENCOUNTER — Other Ambulatory Visit: Payer: BC Managed Care – PPO

## 2017-04-22 DIAGNOSIS — Z0000 Encounter for general adult medical examination without abnormal findings: Secondary | ICD-10-CM

## 2017-04-22 DIAGNOSIS — Z5181 Encounter for therapeutic drug level monitoring: Secondary | ICD-10-CM

## 2017-04-23 ENCOUNTER — Other Ambulatory Visit: Payer: BC Managed Care – PPO

## 2017-04-23 LAB — LIPID PANEL
Cholesterol: 142 mg/dL (ref <200)
HDL: 38 mg/dL — ABNORMAL LOW (ref >40)
LDL CHOLESTEROL (CALC): 78 mg/dL
NON-HDL CHOLESTEROL (CALC): 104 mg/dL (ref <130)
TRIGLYCERIDES: 157 mg/dL — AB (ref <150)
Total CHOL/HDL Ratio: 3.7 (calc) (ref <5.0)

## 2017-04-23 LAB — COMPREHENSIVE METABOLIC PANEL
AG RATIO: 1.9 (calc) (ref 1.0–2.5)
ALBUMIN MSPROF: 4.7 g/dL (ref 3.6–5.1)
ALKALINE PHOSPHATASE (APISO): 36 U/L — AB (ref 40–115)
ALT: 40 U/L (ref 9–46)
AST: 30 U/L (ref 10–40)
BILIRUBIN TOTAL: 0.5 mg/dL (ref 0.2–1.2)
BUN: 17 mg/dL (ref 7–25)
CALCIUM: 9.7 mg/dL (ref 8.6–10.3)
CHLORIDE: 103 mmol/L (ref 98–110)
CO2: 26 mmol/L (ref 20–32)
Creat: 1.07 mg/dL (ref 0.60–1.35)
Globulin: 2.5 g/dL (calc) (ref 1.9–3.7)
Glucose, Bld: 109 mg/dL — ABNORMAL HIGH (ref 65–99)
POTASSIUM: 4.5 mmol/L (ref 3.5–5.3)
Sodium: 140 mmol/L (ref 135–146)
Total Protein: 7.2 g/dL (ref 6.1–8.1)

## 2017-04-23 LAB — CBC WITH DIFFERENTIAL/PLATELET
BASOS ABS: 10 {cells}/uL (ref 0–200)
BASOS PCT: 0.2 %
EOS ABS: 102 {cells}/uL (ref 15–500)
Eosinophils Relative: 2 %
HCT: 42.2 % (ref 38.5–50.0)
Hemoglobin: 14.7 g/dL (ref 13.2–17.1)
Lymphs Abs: 1494 cells/uL (ref 850–3900)
MCH: 30.4 pg (ref 27.0–33.0)
MCHC: 34.8 g/dL (ref 32.0–36.0)
MCV: 87.4 fL (ref 80.0–100.0)
MPV: 10.9 fL (ref 7.5–12.5)
Monocytes Relative: 8.4 %
NEUTROS PCT: 60.1 %
Neutro Abs: 3065 cells/uL (ref 1500–7800)
PLATELETS: 234 10*3/uL (ref 140–400)
RBC: 4.83 10*6/uL (ref 4.20–5.80)
RDW: 12.3 % (ref 11.0–15.0)
TOTAL LYMPHOCYTE: 29.3 %
WBC: 5.1 10*3/uL (ref 3.8–10.8)
WBCMIX: 428 {cells}/uL (ref 200–950)

## 2017-04-23 LAB — TSH: TSH: 1.84 mIU/L (ref 0.40–4.50)

## 2017-04-27 NOTE — Progress Notes (Signed)
Chief Complaint  Patient presents with  . Annual Exam    nonfasting annual exam, labs already done. Does mention that he has an occasional urine discharge after he urinates.     Jordan Stafford is a 46 y.o. male who presents for a complete physical.  He has the following concerns:  Occasionally after voiding, he notices a slight leakage of urine (into underwear). This is sporadic, not frequent. Denies dysuria, hematuria, urgency or frequency. He admits he tends to hold his urine for long periods of time. Denies penile discharge, reports is just urine.  He was last seen by me in June, for a persistent cough after a sinus infection. He was treated with a course of prednisone. Cough improved while on the steroid, but recurred (to a lesser degree) when he completed it.  Ultimately the cough lasted through the trip down to [place] (3rd week of July). Wasn't sick at all while there, and cough did not recur upon arriving home (as one would think if an allergic problem).  Hypertriglyceridemia: taking fenofibrate regularly without side effects (upsets his stomach if taken without food--hasn't taken yet today). He triesto follow a lowfat, low cholesterol diet.  He had some dietary noncompliance prior to his last lipid check 6 months ago, due to trip to [place]. He has been back twice since then, the last was in the end of July. He is taking 2 fish oil capsules twice daily.  He had labs repeated prior to his visit today (see below exam). March 2018 labs (after [facility]): Cholesterol <200 mg/dL 158   Triglycerides <150 mg/dL 182    HDL >40 mg/dL 38    Total CHOL/HDL Ratio <5.0 Ratio 4.2   VLDL <30 mg/dL 36    LDL Cholesterol <100 mg/dL 84    Elevated LFT's were also noted at that time, with an ALT of 67 in 09/2016.  GERD: on protonix--he mainly takes it once daily with breakfast, uses it twice daily prn for recurrent symptoms. Only rarely needs Tums in the evenings (less than once a week). Denies dysphagia.  Overall this has improved, needing Protonix mostly once daily (previously had PM symptoms when not taken BID). Denies wheezing. Sometimes feels a little winded at the top of the stairs at work (worse if on an empty stomach), overall improved.  H/o elevated blood pressures--He has an [employer] wrist monitor to check at home--it seemed to run a little high when compared to manual BP in the office in March, but values at home were not elevated.  Recent BP's have been running 120/70's.  SVT--last saw cardiologist 01/2014. He reports that he was released. He has been off diltiazem for years, with no recurrences of SVT. Had normal stress test 08/2013. He has had no further episodes of SVT since 2008.  H/o rash at hips/upper buttocks since September 2016. Treated in the past with antifungals (both lotrimin and lamisil) and did not notice any improvement. Previously discussed full DDx including granuloma annulare. He only occasionally notices a slight itch, when perspiring. At his visit 6 months ago, he reported that the rash seemed to clear up, and almost completely went away, but then it came back, in a slightly higher location. With time, it ultimately resolved on its own. Slight itch only in the center of his back when hot, no longer where the rash was.  Immunization History  Administered Date(s) Administered  . Influenza Split 05/27/2011  . Influenza,inj,Quad PF,6+ Mos 04/11/2014, 04/15/2015, 04/15/2016, 04/12/2017  . Tdap 07/25/2008  He doesn't recall ever having had or being exposed to a sibling with chicken pox.  Would like to be tested. Last colonoscopy: never  Last PSA: never  Dentist: twice yearly  Ophtho: once yearly  Exercise: "not much"   Past Medical History:  Diagnosis Date  . Acne   . Anxiety   . GERD (gastroesophageal reflux disease)   . Hx of cardiovascular stress test    ETT-Myoview (08/2013):  No ischemia, EF 67%; normal study.  . IBS (irritable bowel syndrome)   .  Renal stone   . Seasonal allergies   . Supraventricular tachycardia    Dr Caryl Comes    Past Surgical History:  Procedure Laterality Date  . MOUTH SURGERY     x3  . TOENAIL EXCISION     ingrown nail    Social History   Social History  . Marital status: Married    Spouse name: N/A  . Number of children: 1  . Years of education: N/A   Occupational History  . professor of [employer] History Main Topics  . Smoking status: Never Smoker  . Smokeless tobacco: Never Used  . Alcohol use No  . Drug use: No  . Sexual activity: Yes    Partners: Female   Other Topics Concern  . Not on file   Social History Narrative   Married 12/2011, has a cockapoo. Has a daughter, Bubba Hales (born 09/02/14). 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    Family History  Problem Relation Age of Onset  . [place] syndrome Mother   . Hypertension Mother   . COPD Mother        smoker  . Urolithiasis Mother   . Diabetes Father   . Hypertension Father   . Cancer Father 26       renal; bladder CA at 62  . Kidney cancer Father   . Heart disease Maternal Grandmother        MI at 18  . Cancer Paternal Grandfather        lung  . Heart disease Paternal Grandmother        late 59's  . Coronary artery disease Neg Hx     Outpatient Encounter Prescriptions as of 04/28/2017  Medication Sig Note  . cetirizine (ZYRTEC) 10 MG tablet Take 10 mg by mouth daily.   . fenofibrate 160 MG tablet Take 1 tablet (160 mg total) by mouth daily.   . Multiple Vitamin (MULTIVITAMINS PO) Take by mouth daily.   04/15/2015: Takes daily   . OMEGA 3 1000 MG CAPS Take 1,000-2,000 mg by mouth daily.  04/15/2015: Takes 2 BID  . pantoprazole (PROTONIX) 40 MG tablet TAKE 1 TABLET (40 MG TOTAL) BY MOUTH 2 (TWO) TIMES DAILY BEFORE A MEAL. 04/28/2017: Uses qAM, BID only if needed, infrequent  . Probiotic Product ([place]) Take by mouth.   . calcium carbonate (TUMS - DOSED IN MG ELEMENTAL  CALCIUM) 500 MG chewable tablet Chew 2 tablets by mouth as needed for indigestion or heartburn. Reported on 11/26/2015 04/28/2017: Prn in the evenings, about once a week or less  . ibuprofen (ADVIL,MOTRIN) 200 MG tablet Take 200-400 mg by mouth as needed. Reported on 11/26/2015   . simethicone (MYLICON) 762 MG chewable tablet Chew 125 mg by mouth every 6 (six) hours as needed. Reported on 11/26/2015 04/28/2017: Rarely needs  . [DISCONTINUED] dextromethorphan (DELSYM) 30 MG/5ML liquid Take 15 mg by mouth as needed for  cough. 01/13/2017: Took last night  . [DISCONTINUED] Digestive Enzymes (PAPAYA AND ENZYMES PO) Take by mouth. Reported on 11/26/2015 01/07/2012: Takes as needed, if upset stomach  . [DISCONTINUED] HYDROcodone-homatropine (HYCODAN) 5-1.5 MG/5ML syrup Take 5 mLs by mouth every 8 (eight) hours as needed for cough. (Patient not taking: Reported on 01/13/2017)   . [DISCONTINUED] predniSONE (DELTASONE) 20 MG tablet Take 1 tablet (20 mg total) by mouth 2 (two) times daily with a meal.    No facility-administered encounter medications on file as of 04/28/2017.     No Known Allergies  ROS: The patient denies anorexia, fever, weight changes, headaches, vision loss, decreased hearing, ear pain, hoarseness, chest pain, dizziness, syncope, dyspnea on exertion, cough, swelling, nausea, vomiting, constipation, abdominal pain, melena, hematochezia, indigestion/heartburn (only after certain foods in the evening, relieved by Tums), hematuria, incontinence, erectile dysfunction, nocturia, weakened urine stream, dysuria, genital lesions, joint pains (just thumbs intermittently), numbness, tingling, weakness, tremor, depression, abnormal bleeding/bruising, or enlarged lymph nodes  Some anxiety related to work stress--unchanged/stable.  Denies insomnia--falls asleep on couch, sometimes has a hard time getting back to sleep when gets into bed. No recurrences of SVT Infrequent diarrhea, related to IBS He has a very  sensitive sense of smell, unchanged Infrequent heartburn. Urinary leakage as per HPI.    PHYSICAL EXAM:  BP 130/86 (BP Location: Left Arm, Patient Position: Sitting, Cuff Size: Normal)   Pulse 80   Ht 5' 9.25" (1.759 m)   Wt 183 lb 12.8 oz (83.4 kg)   BMI 26.95 kg/m   Wt Readings from Last 3 Encounters:  04/28/17 183 lb 12.8 oz (83.4 kg)  01/13/17 186 lb (84.4 kg)  12/28/16 184 lb 6.4 oz (83.6 kg)    General Appearance:  Alert, cooperative, no distress, appears stated age, in good spirits  Head:  Normocephalic, without obvious abnormality, atraumatic   Eyes:  PERRL, conjunctiva/corneas clear, EOM's intact, fundi benign   Ears:  Normal TM's and external ear canals   Nose:  Nares normal, mucosa is mildly edematous with clear mucus, no sinus tenderness   Throat:  Lips, mucosa, and tongue normal; teeth and gums normal   Neck:  Supple, no lymphadenopathy; thyroid: no enlargement/tenderness/ nodules; no carotid bruit or JVD   Back:  Spine nontender, no curvature, ROM normal, no CVA tenderness   Lungs:  Clear to auscultation bilaterally without wheezes, rales or ronchi; respirations unlabored   Chest Wall:  No tenderness or deformity   Heart:  Regular rate and rhythm, S1 and S2 normal, no murmur, rub or gallop   Breast Exam:  No chest wall tenderness, masses or gynecomastia   Abdomen:  Soft, non-tender, nondistended, normoactive bowel sounds, no masses, no hepatosplenomegaly   Genitalia:  Normal male external genitalia without lesions. Testicles without masses. No inguinal hernias.   Rectal:  Normal sphincter tone, no masses or tenderness; guaiac negative stool. Prostate smooth, no nodules, not enlarged.   Extremities:  No clubbing, cyanosis or edema. Subungual hemorrhage left 2nd toenail (blue, nontender) from recent injury  Pulses:  2+ and symmetric all extremities   Skin:  Skin color, texture, turgor normal. No rash noted. Two extremely faint areas  of hyperpigmentation vs very mild erythema at left upper buttock/lower back (not linear/circular; prior areas of rash completely resolved)  Lymph nodes:  Cervical, supraclavicular, and axillary nodes normal   Neurologic:  CNII-XII intact, normal strength, sensation and gait; reflexes 2+ and symmetric throughout   Psych:  Normal mood, hygiene and grooming. Full range of  affect     Chemistry      Component Value Date/Time   NA 140 04/22/2017 0754   K 4.5 04/22/2017 0754   CL 103 04/22/2017 0754   CO2 26 04/22/2017 0754   BUN 17 04/22/2017 0754   CREATININE 1.07 04/22/2017 0754      Component Value Date/Time   CALCIUM 9.7 04/22/2017 0754   ALKPHOS 46 10/12/2016 1638   AST 30 04/22/2017 0754   ALT 40 04/22/2017 0754   BILITOT 0.5 04/22/2017 0754     Fasting glucose 109  Lab Results  Component Value Date   TSH 1.84 04/22/2017   Lab Results  Component Value Date   WBC 5.1 04/22/2017   HGB 14.7 04/22/2017   HCT 42.2 04/22/2017   MCV 87.4 04/22/2017   PLT 234 04/22/2017   Lab Results  Component Value Date   CHOL 142 04/22/2017   HDL 38 (L) 04/22/2017   LDLCALC 84 10/12/2016   TRIG 157 (H) 04/22/2017   CHOLHDL 3.7 04/22/2017     ASSESSMENT/PLAN:  Routine general medical examination at a health care facility - Plan: POCT Urinalysis DIP (Proadvantage Device)  Impaired fasting glucose - reviewed diet, exercise, weight loss  Gastroesophageal reflux disease, esophagitis presence not specified - controlled  Hypertriglyceridemia - improved, slightly elevated; reviewed diet. Continue fenofibrate and fish oil  SVT/ PSVT/ PAT - no recurrences, doing well  Screening for viral disease - denies h/o varicella. Would like to be checked (if neg, Varicella vaccine; if +, Shingrix age 62) - Plan: Varicella zoster antibody, IgG   Elevated fasting sugar (was normal last check, with normal A1c; had been slightly elevated the time prior) Elevated LFT's  resolved. Lipids borderline, HDL still low  Varicella titer, to check for immunity.  If non-immune, start varicella vaccine.  If immune, shingrix at age 70.  Low carb diet, regular exercise, weight loss. Counseled extensively on exercise  Recommended at least 30 minutes of aerobic activity at least 5 days/week, weight-bearing exercise 2-3x/wk; proper sunscreen use reviewed; healthy diet and alcohol recommendations (less than or equal to 2 drinks/day) reviewed; regular seatbelt use; changing batteries in smoke detectors. Self-testicular exams. Immunization recommendations discussed--UTD.  Td booster next year, Shingrix age 1. Colonoscopy recommendations reviewed--age 5.  Counseled re: reflux and risks in detail. Consider EGD when he is 59 and going to colonoscopy, sooner if develops dysphagia or worsening reflux despite treatment.   F/u 6 months with fasting labs prior  Glu, LFT, lipids, A1c

## 2017-04-28 ENCOUNTER — Ambulatory Visit (INDEPENDENT_AMBULATORY_CARE_PROVIDER_SITE_OTHER): Payer: BC Managed Care – PPO | Admitting: Family Medicine

## 2017-04-28 ENCOUNTER — Encounter: Payer: Self-pay | Admitting: Family Medicine

## 2017-04-28 VITALS — BP 130/86 | HR 80 | Ht 69.25 in | Wt 183.8 lb

## 2017-04-28 DIAGNOSIS — R7301 Impaired fasting glucose: Secondary | ICD-10-CM

## 2017-04-28 DIAGNOSIS — Z5181 Encounter for therapeutic drug level monitoring: Secondary | ICD-10-CM | POA: Diagnosis not present

## 2017-04-28 DIAGNOSIS — Z0000 Encounter for general adult medical examination without abnormal findings: Secondary | ICD-10-CM | POA: Diagnosis not present

## 2017-04-28 DIAGNOSIS — I471 Supraventricular tachycardia: Secondary | ICD-10-CM | POA: Diagnosis not present

## 2017-04-28 DIAGNOSIS — E781 Pure hyperglyceridemia: Secondary | ICD-10-CM | POA: Diagnosis not present

## 2017-04-28 DIAGNOSIS — Z1159 Encounter for screening for other viral diseases: Secondary | ICD-10-CM | POA: Diagnosis not present

## 2017-04-28 DIAGNOSIS — K219 Gastro-esophageal reflux disease without esophagitis: Secondary | ICD-10-CM | POA: Diagnosis not present

## 2017-04-28 LAB — POCT URINALYSIS DIP (PROADVANTAGE DEVICE)
BILIRUBIN UA: NEGATIVE
BILIRUBIN UA: NEGATIVE mg/dL
Glucose, UA: NEGATIVE mg/dL
LEUKOCYTES UA: NEGATIVE
Nitrite, UA: NEGATIVE
PH UA: 6 (ref 5.0–8.0)
PROTEIN UA: NEGATIVE mg/dL
RBC UA: NEGATIVE
SPECIFIC GRAVITY, URINE: 1.015
Urobilinogen, Ur: NEGATIVE

## 2017-04-28 NOTE — Patient Instructions (Addendum)
  HEALTH MAINTENANCE RECOMMENDATIONS:  It is recommended that you get at least 30 minutes of aerobic exercise at least 5 days/week (for weight loss, you may need as much as 60-90 minutes). This can be any activity that gets your heart rate up. This can be divided in 10-15 minute intervals if needed, but try and build up your endurance at least once a week.  Weight bearing exercise is also recommended twice weekly.  Eat a healthy diet with lots of vegetables, fruits and fiber.  "Colorful" foods have a lot of vitamins (ie green vegetables, tomatoes, red peppers, etc).  Limit sweet tea, regular sodas and alcoholic beverages, all of which has a lot of calories and sugar.  Up to 2 alcoholic drinks daily may be beneficial for men (unless trying to lose weight, watch sugars).  Drink a lot of water.  Sunscreen of at least SPF 30 should be used on all sun-exposed parts of the skin when outside between the hours of 10 am and 4 pm (not just when at beach or pool, but even with exercise, golf, tennis, and yard work!)  Use a sunscreen that says "broad spectrum" so it covers both UVA and UVB rays, and make sure to reapply every 1-2 hours.  Remember to change the batteries in your smoke detectors when changing your clock times in the spring and fall.  Use your seat belt every time you are in a car, and please drive safely and not be distracted with cell phones and texting while driving.  Your fasting sugar was slightly high at 109, considered "pre-diabetic". Be sure to limit your portions of "white" foods (bread, pasta, rice), sweets, fried foods. Regular exercise and weight loss will also help keep the sugars down.

## 2017-04-29 LAB — VARICELLA ZOSTER ANTIBODY, IGG

## 2017-05-01 ENCOUNTER — Other Ambulatory Visit: Payer: Self-pay | Admitting: Family Medicine

## 2017-05-02 ENCOUNTER — Other Ambulatory Visit: Payer: Self-pay | Admitting: Family Medicine

## 2017-05-02 DIAGNOSIS — E781 Pure hyperglyceridemia: Secondary | ICD-10-CM

## 2017-05-13 ENCOUNTER — Other Ambulatory Visit: Payer: Self-pay

## 2017-05-18 ENCOUNTER — Other Ambulatory Visit (INDEPENDENT_AMBULATORY_CARE_PROVIDER_SITE_OTHER): Payer: BC Managed Care – PPO

## 2017-05-18 DIAGNOSIS — Z23 Encounter for immunization: Secondary | ICD-10-CM

## 2017-06-22 ENCOUNTER — Other Ambulatory Visit (INDEPENDENT_AMBULATORY_CARE_PROVIDER_SITE_OTHER): Payer: BC Managed Care – PPO

## 2017-06-22 DIAGNOSIS — Z23 Encounter for immunization: Secondary | ICD-10-CM | POA: Diagnosis not present

## 2017-08-09 ENCOUNTER — Telehealth: Payer: Self-pay | Admitting: Family Medicine

## 2017-08-09 MED ORDER — PANTOPRAZOLE SODIUM 40 MG PO TBEC: DELAYED_RELEASE_TABLET | ORAL | 0 refills | Status: DC

## 2017-08-09 NOTE — Telephone Encounter (Signed)
Recv'd refill request for Pantoprazole 90 day supply from [facility]

## 2017-08-09 NOTE — Telephone Encounter (Signed)
Done

## 2017-08-21 NOTE — Telephone Encounter (Signed)
DONE

## 2017-08-26 ENCOUNTER — Ambulatory Visit: Payer: BC Managed Care – PPO | Admitting: Family Medicine

## 2017-08-26 ENCOUNTER — Encounter: Payer: Self-pay | Admitting: Family Medicine

## 2017-08-26 VITALS — BP 120/84 | HR 88 | Ht 69.25 in | Wt 174.8 lb

## 2017-08-26 DIAGNOSIS — N529 Male erectile dysfunction, unspecified: Secondary | ICD-10-CM | POA: Diagnosis not present

## 2017-08-26 MED ORDER — TADALAFIL 5 MG PO TABS: 5.0000 mg | ORAL_TABLET | Freq: Every day | ORAL | 1 refills | Status: DC | PRN

## 2017-08-26 NOTE — Patient Instructions (Signed)
Cialis--use the prescription savings card to get the 30-day trial of 5mg  tablets. Instead of using it daily, use 10-20 mg (2, up to 4 tablets at a time, only if needed) as needed prior to sexual intercourse.  If this isn't effective, or not tolerated due to side effects, look online for coupons for Levitra or Viagra. We also spoke of the generic Sildenafil (same medication as Viagra, but lower dose)--you'd need to check on cost/quantity to be prescribed (cash-pay, specials from [place], [facility], [facility], etc). That one you would take between 2-4 tablet, up to maximum of 5 tablets at a time, to be equivalent to the higher dose 100mg  Viagra.    Erectile Dysfunction Erectile dysfunction (ED) is the inability to get or keep an erection in order to have sexual intercourse. Erectile dysfunction may include:  Inability to get an erection.  Lack of enough hardness of the erection to allow penetration.  Loss of the erection before sex is finished.  What are the causes? This condition may be caused by:  Certain medicines, such as: ? Pain relievers. ? Antihistamines. ? Antidepressants. ? Blood pressure medicines. ? Water pills (diuretics). ? Ulcer medicines. ? Muscle relaxants. ? Drugs.  Excessive drinking.  Psychological causes, such as: ? Anxiety. ? Depression. ? Sadness. ? Exhaustion. ? Performance fear. ? Stress.  Physical causes, such as: ? Artery problems. This may include diabetes, smoking, liver disease, or atherosclerosis. ? High blood pressure. ? Hormonal problems, such as low testosterone. ? Obesity. ? Nerve problems. This may include back or pelvic injuries, diabetes mellitus, multiple sclerosis, or Parkinson disease.  What are the signs or symptoms? Symptoms of this condition include:  Inability to get an erection.  Lack of enough hardness of the erection to allow penetration.  Loss of the erection before sex is finished.  Normal erections at some times, but with  frequent unsatisfactory episodes.  Low sexual satisfaction in either partner due to erection problems.  A curved penis occurring with erection. The curve may cause pain or the penis may be too curved to allow for intercourse.  Never having nighttime erections.  How is this diagnosed? This condition is often diagnosed by:  Performing a physical exam to find other diseases or specific problems with the penis.  Asking you detailed questions about the problem.  Performing blood tests to check for diabetes mellitus or to measure hormone levels.  Performing other tests to check for underlying health conditions.  Performing an ultrasound exam to check for scarring.  Performing a test to check blood flow to the penis.  Doing a sleep study at home to measure nighttime erections.  How is this treated? This condition may be treated by:  Medicine taken by mouth to help you achieve an erection (oral medicine).  Hormone replacement therapy to replace low testosterone levels.  Medicine that is injected into the penis. Your health care provider may instruct you how to give yourself these injections at home.  Vacuum pump. This is a pump with a ring on it. The pump and ring are placed on the penis and used to create pressure that helps the penis become erect.  Penile implant surgery. In this procedure, you may receive: ? An inflatable implant. This consists of cylinders, a pump, and a reservoir. The cylinders can be inflated with a fluid that helps to create an erection, and they can be deflated after intercourse. ? A semi-rigid implant. This consists of two silicone rubber rods. The rods provide some rigidity. They  are also flexible, so the penis can both curve downward in its normal position and become straight for sexual intercourse.  Blood vessel surgery, to improve blood flow to the penis. During this procedure, a blood vessel from a different part of the body is placed into the penis to  allow blood to flow around (bypass) damaged or blocked blood vessels.  Lifestyle changes, such as exercising more, losing weight, and quitting smoking.  Follow these instructions at home: Medicines  Take over-the-counter and prescription medicines only as told by your health care provider. Do not increase the dosage without first discussing it with your health care provider.  If you are using self-injections, perform injections as directed by your health care provider. Make sure to avoid any veins that are on the surface of the penis. After giving an injection, apply pressure to the injection site for 5 minutes. General instructions  Exercise regularly, as directed by your health care provider. Work with your health care provider to lose weight, if needed.  Do not use any products that contain nicotine or tobacco, such as cigarettes and e-cigarettes. If you need help quitting, ask your health care provider.  Before using a vacuum pump, read the instructions that come with the pump and discuss any questions with your health care provider.  Keep all follow-up visits as told by your health care provider. This is important. Contact a health care provider if:  You feel nauseous.  You vomit. Get help right away if:  You are taking oral or injectable medicines and you have an erection that lasts longer than 4 hours. If your health care provider is unavailable, go to the nearest emergency room for evaluation. An erection that lasts much longer than 4 hours can result in permanent damage to your penis.  You have severe pain in your groin or abdomen.  You develop redness or severe swelling of your penis.  You have redness spreading up into your groin or lower abdomen.  You are unable to urinate.  You experience chest pain or a rapid heart beat (palpitations) after taking oral medicines. Summary  Erectile dysfunction (ED) is the inability to get or keep an erection during sexual  intercourse. This problem can usually be treated successfully.  This condition is diagnosed based on a physical exam, your symptoms, and tests to determine the cause. Treatment varies depending on the cause, and may include medicines, hormone therapy, surgery, or vacuum pump.  You may need follow-up visits to make sure that you are using your medicines or devices correctly.  Get help right away if you are taking or injecting medicines and you have an erection that lasts longer than 4 hours. This information is not intended to replace advice given to you by your health care provider. Make sure you discuss any questions you have with your health care provider. Document Released: 07/10/2000 Document Revised: 07/29/2016 Document Reviewed: 07/29/2016 Elsevier Interactive Patient Education  2017 [employer].

## 2017-08-26 NOTE — Progress Notes (Signed)
Chief Complaint  Patient presents with  . Erectile Dysfunction    wants to discuss, has been going on for about a month.     ED has been gradually worsening,more trouble in the last month, more consistently an issue, for which he now desires treatment. He is unable to get fully erect, or maintain an erection. Normal libido. No relationship issues. Normal erections in the morning (when wife is still asleep)  PMH, PSH, SH reviewed  Current Outpatient Medications on File Prior to Visit  Medication Sig Dispense Refill  . calcium carbonate (TUMS - DOSED IN MG ELEMENTAL CALCIUM) 500 MG chewable tablet Chew 2 tablets by mouth as needed for indigestion or heartburn. Reported on 11/26/2015    . cetirizine (ZYRTEC) 10 MG tablet Take 10 mg by mouth daily.    . fenofibrate 160 MG tablet TAKE 1 TABLET (160 MG TOTAL) BY MOUTH DAILY. 30 tablet 5  . Multiple Vitamin (MULTIVITAMINS PO) Take by mouth daily.      . OMEGA 3 1000 MG CAPS Take 1,000-2,000 mg by mouth daily.     . pantoprazole (PROTONIX) 40 MG tablet TAKE 1 TABLET (40 MG TOTAL) BY MOUTH 2 (TWO) TIMES DAILY BEFORE A MEAL. 180 tablet 0  . Probiotic Product ([place]) Take by mouth.    Mar[place] Kitchen ibuprofen (ADVIL,MOTRIN) 200 MG tablet Take 200-400 mg by mouth as needed. Reported on 11/26/2015    . simethicone (MYLICON) 347 MG chewable tablet Chew 125 mg by mouth every 6 (six) hours as needed. Reported on 11/26/2015    . [DISCONTINUED] ranitidine (RANITIDINE 75) 75 MG tablet Take 75 mg by mouth 2 (two) times daily.       No current facility-administered medications on file prior to visit.    No Known Allergies  ROS: no fever, chills, URI symptoms, fatigue, headaches, dizziness, chest pain, shortness of breath, GI complaints.  +ED per HPI.  No urinary complaints or decreased libido  PHYSICAL EXAM:  BP 120/84   Pulse 88   Ht 5' 9.25" (1.759 m)   Wt 174 lb 12.8 oz (79.3 kg)   BMI 25.63 kg/m   Well appearing pleasant male, in good  spirits, in no distress Normal mood, affect, hygiene and grooming Remainder of visit was limited to counseling.   ASSESSMENT/PLAN:  Erectile dysfunction, unspecified erectile dysfunction type - Plan: tadalafil (CIALIS) 5 MG tablet  Discussed rx meds, risks/side effects.   Cialis--use the prescription savings card to get the 30-day trial of 5mg  tablets. Instead of using it daily, use 10-20 mg (2, up to 4 tablets at a time, only if needed) as needed prior to sexual intercourse.  If this isn't effective, or not tolerated due to side effects, look online for coupons for Levitra or Viagra. We also spoke of the generic Sildenafil (same medication as Viagra, but lower dose)--you'd need to check on cost/quantity to be prescribed (cash-pay, specials from [place], [facility], [facility], etc). That one you would take between 2-4 tablet, up to maximum of 5 tablets at a time, to be equivalent to the higher dose 100mg  Viagra.

## 2017-08-31 ENCOUNTER — Encounter: Payer: Self-pay | Admitting: Family Medicine

## 2017-10-21 ENCOUNTER — Other Ambulatory Visit: Payer: BC Managed Care – PPO

## 2017-10-21 DIAGNOSIS — R7301 Impaired fasting glucose: Secondary | ICD-10-CM

## 2017-10-21 DIAGNOSIS — Z5181 Encounter for therapeutic drug level monitoring: Secondary | ICD-10-CM

## 2017-10-21 DIAGNOSIS — E781 Pure hyperglyceridemia: Secondary | ICD-10-CM

## 2017-10-21 NOTE — Addendum Note (Signed)
Addended by: Minette Headland A on: 10/21/2017 10:49 AM   Modules accepted: Orders

## 2017-10-22 ENCOUNTER — Other Ambulatory Visit: Payer: BC Managed Care – PPO

## 2017-10-22 LAB — LIPID PANEL
CHOLESTEROL TOTAL: 130 mg/dL (ref 100–199)
Chol/HDL Ratio: 2.7 ratio (ref 0.0–5.0)
HDL: 48 mg/dL (ref >39)
LDL CALC: 73 mg/dL (ref 0–99)
TRIGLYCERIDES: 43 mg/dL (ref 0–149)
VLDL Cholesterol Cal: 9 mg/dL (ref 5–40)

## 2017-10-22 LAB — HEPATIC FUNCTION PANEL
ALBUMIN: 4.8 g/dL (ref 3.5–5.5)
ALK PHOS: 45 IU/L (ref 39–117)
ALT: 29 IU/L (ref 0–44)
AST: 22 IU/L (ref 0–40)
BILIRUBIN, DIRECT: 0.17 mg/dL (ref 0.00–0.40)
Bilirubin Total: 0.4 mg/dL (ref 0.0–1.2)
Total Protein: 7.1 g/dL (ref 6.0–8.5)

## 2017-10-22 LAB — HEMOGLOBIN A1C
ESTIMATED AVERAGE GLUCOSE: 105 mg/dL
Hgb A1c MFr Bld: 5.3 % (ref 4.8–5.6)

## 2017-10-22 LAB — GLUCOSE, RANDOM: Glucose: 97 mg/dL (ref 65–99)

## 2017-10-26 NOTE — Progress Notes (Signed)
Chief Complaint  Patient presents with  . Hyperlipidemia    med check, labs already done.    He reports he has lost weight-- has been eating healthier, eating less. Exercise remains sporadic.    Hypertriglyceridemia: taking fenofibrate regularly without side effects (upsets his stomach if taken without food). He triesto follow a lowfat, low cholesterol diet.  Admits to sometimes forgetting the fish oil recently. He has also had some mild elevations in LFT's in the past  He had labs done prior to his visit today, which were improved/normal.  Lab Results  Component Value Date   CHOL 130 10/21/2017   HDL 48 10/21/2017   LDLCALC 73 10/21/2017   TRIG 43 10/21/2017   CHOLHDL 2.7 10/21/2017   Lab Results  Component Value Date   ALT 29 10/21/2017   AST 22 10/21/2017   ALKPHOS 45 10/21/2017   BILITOT 0.4 10/21/2017   Impaired fasting glucose:  Sugar was 109 at his physical 6 months ago.  He had labs repeated prior to today's visit. Lab Results  Component Value Date   HGBA1C 5.3 10/21/2017  Fasting glucose 97   ED was discussed at his last visit in January.  He reported being unable to get fully erect, or maintain an erection. Normal libido.  No relationship issues. Normal erections in the morning (when wife is still asleep).  The coupon card he was given was expired. He wasn't able to get any medication, but hasn't actually needed it. ED seems to have resolved.  GERD: on protonix--He had to increase it to BID due to significant work stressors.  This controls his symptoms well.  Uses Tums only if he doesn't take the second pill, in the evening.  Used to do well with just 1 daily, until the last few months. Denies dysphagia, wheezing.  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 He will be off during the summer.  While there may be some financial stress, he is looking forward to spending the time with his daughter before she starts  preschool.  H/o elevated blood pressures--He has an [employer] wrist monitor to check at home--it seemed to run a little high when compared to manual BP in the office in the past (last year), but values at home were not elevated.  Recent BP's have been running 120/70's or less (down to 99/67). Denies headaches, dizziness, SVT.  H/o SVT: He has been off diltiazem for years, with no recurrences of SVT. Had normal stress test 08/2013. He has had no further episodes of SVT since 2008. Released from care of cardiologist in 01/2014.   H/o rash at hips/upper buttocks, comes/goes since September 2016. Treated in the past with antifungals (both lotrimin and lamisil) and did not notice any improvement.  Seems to come and go, not always in the same spot, has been moving up.Previously discussed full DDx including granuloma annulare.  Currently has a slight line of rash, not itchy, on the left side.  PMH, PSH, SH reviewed  Outpatient Encounter Medications as of 10/27/2017  Medication Sig Note  . calcium carbonate (TUMS - DOSED IN MG ELEMENTAL CALCIUM) 500 MG chewable tablet Chew 2 tablets by mouth as needed for indigestion or heartburn. Reported on 11/26/2015 04/28/2017: Prn in the evenings, about once a week or less  . cetirizine (ZYRTEC) 10 MG tablet Take 10 mg by mouth daily.   . fenofibrate 160 MG tablet Take 1 tablet (160 mg total) by mouth  daily.   . Multiple Vitamin (MULTIVITAMINS PO) Take by mouth daily.   04/15/2015: Takes daily   . OMEGA 3 1000 MG CAPS Take 1,000-2,000 mg by mouth daily.  04/15/2015: Takes 2 BID  . pantoprazole (PROTONIX) 40 MG tablet TAKE 1 TABLET (40 MG TOTAL) BY MOUTH 2 (TWO) TIMES DAILY BEFORE A MEAL.   . Probiotic Product ([place]ona) Take by mouth.   . [DISCONTINUED] fenofibrate 160 MG tablet TAKE 1 TABLET (160 MG TOTAL) BY MOUTH DAILY.   . [DISCONTINUED] pantoprazole (PROTONIX) 40 MG tablet TAKE 1 TABLET (40 MG TOTAL) BY MOUTH 2 (TWO) TIMES DAILY BEFORE A MEAL.   Marland Kitchen  ibuprofen (ADVIL,MOTRIN) 200 MG tablet Take 200-400 mg by mouth as needed. Reported on 11/26/2015   . simethicone (MYLICON) 007 MG chewable tablet Chew 125 mg by mouth every 6 (six) hours as needed. Reported on 11/26/2015 04/28/2017: Rarely needs  . tadalafil (CIALIS) 5 MG tablet Take 1 tablet (5 mg total) by mouth daily as needed for erectile dysfunction. Use as directed by physician (Patient not taking: Reported on 10/27/2017)   . [DISCONTINUED] ranitidine (RANITIDINE 75) 75 MG tablet Take 75 mg by mouth 2 (two) times daily.      No facility-administered encounter medications on file as of 10/27/2017.    No Known Allergies  ROS: no fever, chills, URI symptoms, headaches, dizziness, chest pain, palpitations, tachycardia, abdominal pain, bowel changes, joint pains.  ED resolved.  +GERD flared, controlled with BID PPI per HPI.  +work stress. +intentional weight loss   PHYSICAL EXAM:  BP 126/88   Pulse 76   Ht 5' 9.25" (1.759 m)   Wt 168 lb (76.2 kg)   BMI 24.63 kg/m   Wt Readings from Last 3 Encounters:  10/27/17 168 lb (76.2 kg)  08/26/17 174 lb 12.8 oz (79.3 kg)  04/28/17 183 lb 12.8 oz (83.4 kg)   Pleasant, well-appearing male in no distress HEENT: conjunctiva and sclera are clear, EOMi, OP clear Neck: no lymphadenopathy, thyromegaly or mass Heart: regular rate and rhythm,no murmur Lungs: clear bilaterally Back: no spinal or CVA tenderness Abdomen: soft, nontender, no organomegaly or mass. nontender in epigastrium Extremities: no edema, normal pulses Skin: normal turgor. Left upper buttock has a pink, dry, slightly raised patch that is linear horizontally, measuring about 2 x 0.5 inches in size.  No central clearing, doesn't seem to be part of a larger circle as in the past. Neuro: alert and oriented, cranial nerves intact, normal gait Psych: normal mood, affect, hygiene and grooming.  Appears somewhat anxious/stress in discussing work stress and upcoming  changes.   ASSESSMENT/PLAN   Hypertriglyceridemia - well controlled on current regimen and improved diet. Cont current regimen - Plan: fenofibrate 160 MG tablet  Gastroesophageal reflux disease, esophagitis presence not specified - worsened with increased stressors, responding well to BID dosing of PPI. Reviewed diet - Plan: pantoprazole (PROTONIX) 40 MG tablet  Impaired fasting glucose - improved with weight loss. Weight is at goal (his goal is 160). Encouraged daily exercise  Erectile dysfunction, unspecified erectile dysfunction type - resolved; if recurs, will call to see if we have samples/coupons  Rash - today appears to be more consistent with eczema. Discussed moisturizing, OTC HC prn    F/u October as scheduled, with labs prior CBC, c-met, lipids

## 2017-10-27 ENCOUNTER — Ambulatory Visit: Payer: BC Managed Care – PPO | Admitting: Family Medicine

## 2017-10-27 ENCOUNTER — Encounter: Payer: Self-pay | Admitting: Family Medicine

## 2017-10-27 VITALS — BP 126/88 | HR 76 | Ht 69.25 in | Wt 168.0 lb

## 2017-10-27 DIAGNOSIS — R21 Rash and other nonspecific skin eruption: Secondary | ICD-10-CM

## 2017-10-27 DIAGNOSIS — N529 Male erectile dysfunction, unspecified: Secondary | ICD-10-CM

## 2017-10-27 DIAGNOSIS — E781 Pure hyperglyceridemia: Secondary | ICD-10-CM | POA: Diagnosis not present

## 2017-10-27 DIAGNOSIS — K219 Gastro-esophageal reflux disease without esophagitis: Secondary | ICD-10-CM

## 2017-10-27 DIAGNOSIS — R7301 Impaired fasting glucose: Secondary | ICD-10-CM

## 2017-10-27 DIAGNOSIS — Z5181 Encounter for therapeutic drug level monitoring: Secondary | ICD-10-CM | POA: Diagnosis not present

## 2017-10-27 MED ORDER — FENOFIBRATE 160 MG PO TABS: 160.0000 mg | ORAL_TABLET | Freq: Every day | ORAL | 1 refills | Status: DC

## 2017-10-27 MED ORDER — PANTOPRAZOLE SODIUM 40 MG PO TBEC: DELAYED_RELEASE_TABLET | ORAL | 1 refills | Status: DC

## 2017-10-27 NOTE — Patient Instructions (Signed)
Congratulations on your weight loss. You no longer have evidence of pre-diabetes.  Your lipids are the best they have looked. Continue your current healthy diet and medications. Try and get at least 150 minutes of aerobic exercise each week.  The rash on the buttock looks more like a mild eczema now--consider cortisone creams if itchy, or otherwise just keeping it well moisturized.  Avoid prolonged hot showers/baths.   Food Choices for Gastroesophageal Reflux Disease, Adult When you have gastroesophageal reflux disease (GERD), the foods you eat and your eating habits are very important. Choosing the right foods can help ease the discomfort of GERD. Consider working with a diet and nutrition specialist (dietitian) to help you make healthy food choices. What general guidelines should I follow? Eating plan  Choose healthy foods low in fat, such as fruits, vegetables, whole grains, low-fat dairy products, and lean meat, fish, and poultry.  Eat frequent, small meals instead of three large meals each day. Eat your meals slowly, in a relaxed setting. Avoid bending over or lying down until 2-3 hours after eating.  Limit high-fat foods such as fatty meats or fried foods.  Limit your intake of oils, butter, and shortening to less than 8 teaspoons each day.  Avoid the following: ? Foods that cause symptoms. These may be different for different people. Keep a food diary to keep track of foods that cause symptoms. ? Alcohol. ? Drinking large amounts of liquid with meals. ? Eating meals during the 2-3 hours before bed.  Cook foods using methods other than frying. This may include baking, grilling, or broiling. Lifestyle   Maintain a healthy weight. Ask your health care provider what weight is healthy for you. If you need to lose weight, work with your health care provider to do so safely.  Exercise for at least 30 minutes on 5 or more days each week, or as told by your health care  provider.  Avoid wearing clothes that fit tightly around your waist and chest.  Do not use any products that contain nicotine or tobacco, such as cigarettes and e-cigarettes. If you need help quitting, ask your health care provider.  Sleep with the head of your bed raised. Use a wedge under the mattress or blocks under the bed frame to raise the head of the bed. What foods are not recommended? The items listed may not be a complete list. Talk with your dietitian about what dietary choices are best for you. Grains Pastries or quick breads with added fat. [place] toast. Vegetables Deep fried vegetables. [place] fries. Any vegetables prepared with added fat. Any vegetables that cause symptoms. For some people this may include tomatoes and tomato products, chili peppers, onions and garlic, and horseradish. Fruits Any fruits prepared with added fat. Any fruits that cause symptoms. For some people this may include citrus fruits, such as oranges, grapefruit, pineapple, and lemons. Meats and other protein foods High-fat meats, such as fatty beef or pork, hot dogs, ribs, ham, sausage, salami and bacon. Fried meat or protein, including fried fish and fried chicken. Nuts and nut butters. Dairy Whole milk and chocolate milk. Sour cream. Cream. Ice cream. Cream cheese. Milk shakes. Beverages Coffee and tea, with or without caffeine. Carbonated beverages. Sodas. Energy drinks. Fruit juice made with acidic fruits (such as orange or grapefruit). Tomato juice. Alcoholic drinks. Fats and oils Butter. Margarine. Shortening. Ghee. Sweets and desserts Chocolate and cocoa. Donuts. Seasoning and other foods Pepper. Peppermint and spearmint. Any condiments, herbs, or seasonings that cause  symptoms. For some people, this may include curry, hot sauce, or vinegar-based salad dressings. Summary  When you have gastroesophageal reflux disease (GERD), food and lifestyle choices are very important to help ease the  discomfort of GERD.  Eat frequent, small meals instead of three large meals each day. Eat your meals slowly, in a relaxed setting. Avoid bending over or lying down until 2-3 hours after eating.  Limit high-fat foods such as fatty meat or fried foods. This information is not intended to replace advice given to you by your health care provider. Make sure you discuss any questions you have with your health care provider. Document Released: 07/13/2005 Document Revised: 07/14/2016 Document Reviewed: 07/14/2016 Elsevier Interactive Patient Education  [employer].

## 2017-12-15 ENCOUNTER — Encounter: Payer: Self-pay | Admitting: Family Medicine

## 2018-03-29 ENCOUNTER — Other Ambulatory Visit (INDEPENDENT_AMBULATORY_CARE_PROVIDER_SITE_OTHER): Payer: BC Managed Care – PPO

## 2018-03-29 DIAGNOSIS — Z23 Encounter for immunization: Secondary | ICD-10-CM

## 2018-03-30 ENCOUNTER — Other Ambulatory Visit: Payer: Self-pay | Admitting: Family Medicine

## 2018-03-30 DIAGNOSIS — K219 Gastro-esophageal reflux disease without esophagitis: Secondary | ICD-10-CM

## 2018-03-30 DIAGNOSIS — E781 Pure hyperglyceridemia: Secondary | ICD-10-CM

## 2018-04-14 ENCOUNTER — Other Ambulatory Visit (INDEPENDENT_AMBULATORY_CARE_PROVIDER_SITE_OTHER): Payer: BC Managed Care – PPO

## 2018-04-14 ENCOUNTER — Telehealth: Payer: Self-pay | Admitting: Family Medicine

## 2018-04-14 DIAGNOSIS — Z23 Encounter for immunization: Secondary | ICD-10-CM

## 2018-04-14 NOTE — Telephone Encounter (Signed)
Pt stepped on a nail with his sandal on and the nail did not puncture his foot however his foot is sore. He would like to come in to get tetnus now even though it is not due until December. Is this ok? Call pt at [phone].

## 2018-04-14 NOTE — Telephone Encounter (Signed)
Left message advising patient now.

## 2018-04-14 NOTE — Telephone Encounter (Signed)
Clearly not at risk since didn't penetrate, but he is due.  Fine for Tdap now

## 2018-04-28 ENCOUNTER — Other Ambulatory Visit: Payer: BC Managed Care – PPO

## 2018-04-28 DIAGNOSIS — R7301 Impaired fasting glucose: Secondary | ICD-10-CM

## 2018-04-28 DIAGNOSIS — Z5181 Encounter for therapeutic drug level monitoring: Secondary | ICD-10-CM

## 2018-04-28 DIAGNOSIS — E781 Pure hyperglyceridemia: Secondary | ICD-10-CM

## 2018-04-29 ENCOUNTER — Other Ambulatory Visit: Payer: BC Managed Care – PPO

## 2018-04-29 LAB — COMPREHENSIVE METABOLIC PANEL
ALT: 22 IU/L (ref 0–44)
AST: 21 IU/L (ref 0–40)
Albumin/Globulin Ratio: 2.3 — ABNORMAL HIGH (ref 1.2–2.2)
Albumin: 5.1 g/dL (ref 3.5–5.5)
Alkaline Phosphatase: 38 IU/L — ABNORMAL LOW (ref 39–117)
BILIRUBIN TOTAL: 0.5 mg/dL (ref 0.0–1.2)
BUN/Creatinine Ratio: 14 (ref 9–20)
BUN: 15 mg/dL (ref 6–24)
CO2: 25 mmol/L (ref 20–29)
CREATININE: 1.09 mg/dL (ref 0.76–1.27)
Calcium: 10 mg/dL (ref 8.7–10.2)
Chloride: 99 mmol/L (ref 96–106)
GFR calc non Af Amer: 80 mL/min/{1.73_m2} (ref >59)
GFR, EST AFRICAN AMERICAN: 93 mL/min/{1.73_m2} (ref >59)
GLUCOSE: 85 mg/dL (ref 65–99)
Globulin, Total: 2.2 g/dL (ref 1.5–4.5)
Potassium: 4.3 mmol/L (ref 3.5–5.2)
Sodium: 139 mmol/L (ref 134–144)
TOTAL PROTEIN: 7.3 g/dL (ref 6.0–8.5)

## 2018-04-29 LAB — CBC WITH DIFFERENTIAL/PLATELET
BASOS ABS: 0 10*3/uL (ref 0.0–0.2)
Basos: 0 %
EOS (ABSOLUTE): 0.1 10*3/uL (ref 0.0–0.4)
Eos: 1 %
HEMOGLOBIN: 14.3 g/dL (ref 13.0–17.7)
Hematocrit: 41.9 % (ref 37.5–51.0)
IMMATURE GRANS (ABS): 0 10*3/uL (ref 0.0–0.1)
Immature Granulocytes: 0 %
LYMPHS: 30 %
Lymphocytes Absolute: 1.8 10*3/uL (ref 0.7–3.1)
MCH: 30.7 pg (ref 26.6–33.0)
MCHC: 34.1 g/dL (ref 31.5–35.7)
MCV: 90 fL (ref 79–97)
MONOCYTES: 8 %
Monocytes Absolute: 0.5 10*3/uL (ref 0.1–0.9)
Neutrophils Absolute: 3.6 10*3/uL (ref 1.4–7.0)
Neutrophils: 61 %
Platelets: 222 10*3/uL (ref 150–450)
RBC: 4.66 x10E6/uL (ref 4.14–5.80)
RDW: 12.4 % (ref 12.3–15.4)
WBC: 6 10*3/uL (ref 3.4–10.8)

## 2018-04-29 LAB — LIPID PANEL
CHOL/HDL RATIO: 2.7 ratio (ref 0.0–5.0)
Cholesterol, Total: 126 mg/dL (ref 100–199)
HDL: 46 mg/dL (ref >39)
LDL CALC: 64 mg/dL (ref 0–99)
Triglycerides: 82 mg/dL (ref 0–149)
VLDL CHOLESTEROL CAL: 16 mg/dL (ref 5–40)

## 2018-05-01 NOTE — Progress Notes (Signed)
Chief Complaint  Patient presents with  . Annual Exam    nonfasting annual exam, labs already done. Eye exam due this month with Dr.Shapiro but scheduled for next month. No concerns.     Jordan Stafford is a 47 y.o. male who presents for a complete physical.  He has the following concerns:  Hypertriglyceridemia: He is taking fenofibrate regularly without side effects (upsets his stomach if taken without food). He triesto follow a lowfat, low cholesterol diet.  He had labs done prior to today's visit, see below.  Impaired fasting glucose:  He tries to limit his sugar and sweets.  Had labs prior to visit, see below. Prior A1c's have been normal.  GERD: on protonix--taking it usually once daily, but twice daily when he eats foods which may trigger.  If taking it just in the morning, he uses Tums prn in the evenings.  Denies dysphagia, wheezing.  H/o elevated blood pressures--Hehas an [employer] wrist monitor to check at home--it seemed to run a little high when compared to manual BP in the office in the past (last year), but values at home were not elevated. Recent BP's have been running have been normal, not checked on a regular basis. Denies headaches (tension only), dizziness, SVT.  H/o SVT: He has been off diltiazem for years, with no recurrences of SVT. Had normal stress test 08/2013. He has had no further episodes of SVT since 2008. Released from care of cardiologist in 01/2014.   H/o rash at hips/upper buttocks, comes/goes since September 2016. Treated in the past with antifungals (both lotrimin and lamisil) and did not notice any improvement.  Seems to come and go, not always in the same spot, has been moving up.Previously discussed full DDx including granuloma annulare.  He denies any itching, or aware of any significant rash currently. Has some itching on forehead, some breaking out.   Immunization History  Administered Date(s) Administered  . Influenza Split 05/27/2011  .  Influenza,inj,Quad PF,6+ Mos 04/11/2014, 04/15/2015, 04/15/2016, 04/12/2017, 03/29/2018  . Tdap 07/25/2008, 04/14/2018  . Varicella 05/18/2017, 06/22/2017   Last colonoscopy: never  Last PSA: never  Dentist: twice yearly  Ophtho: once yearly  Exercise: "not much"  Walks briskly at work, but <10 minutes. +weight-bearing lifting daughter.    Past Medical History:  Diagnosis Date  . Acne   . Anxiety   . GERD (gastroesophageal reflux disease)   . Hx of cardiovascular stress test    ETT-Myoview (08/2013):  No ischemia, EF 67%; normal study.  . IBS (irritable bowel syndrome)   . Renal stone   . Seasonal allergies   . Supraventricular tachycardia    Dr Caryl Comes    Past Surgical History:  Procedure Laterality Date  . MOUTH SURGERY     x3  . TOENAIL EXCISION     ingrown nail    Social History   Socioeconomic History  . Marital status: Married    Spouse name: Not on file  . Number of children: 1  . Years of education: Not on file  . Highest education level: Not on file  Occupational History  . Occupation: professor of Biomedical engineer: [employer]  Social Needs  . Financial resource strain: Not on file  . Food insecurity:    Worry: Not on file    Inability: Not on file  . Transportation needs:    Medical: Not on file    Non-medical: Not on file  Tobacco Use  . Smoking status:  Never Smoker  . Smokeless tobacco: Never Used  Substance and Sexual Activity  . Alcohol use: No  . Drug use: No  . Sexual activity: Yes    Partners: Female  Lifestyle  . Physical activity:    Days per week: Not on file    Minutes per session: Not on file  . Stress: Not on file  Relationships  . Social connections:    Talks on phone: Not on file    Gets together: Not on file    Attends religious service: Not on file    Active member of club or organization: Not on file    Attends meetings of clubs or organizations: Not on file    Relationship status: Not on file  .  Intimate partner violence:    Fear of current or ex partner: Not on file    Emotionally abused: Not on file    Physically abused: Not on file    Forced sexual activity: Not on file  Other Topics Concern  . Not on file  Social History Narrative   Married 12/2011, has a cockapoo. Has a daughter, Bubba Hales (born 09/02/14). Teaches Math at [employer]       Family History  Problem Relation Age of Onset  . [place] syndrome Mother   . Hypertension Mother   . COPD Mother        smoker  . Urolithiasis Mother   . Diabetes Father   . Hypertension Father   . Cancer Father 53       renal; bladder CA at 76  . Kidney cancer Father   . Heart disease Maternal Grandmother        MI at 27  . Cancer Paternal Grandfather        lung  . Heart disease Paternal Grandmother        late 17's  . Coronary artery disease Neg Hx     Outpatient Encounter Medications as of 05/03/2018  Medication Sig Note  . calcium carbonate (TUMS - DOSED IN MG ELEMENTAL CALCIUM) 500 MG chewable tablet Chew 2 tablets by mouth as needed for indigestion or heartburn. Reported on 11/26/2015 04/28/2017: Prn in the evenings, about once a week or less  . cetirizine (ZYRTEC) 10 MG tablet Take 10 mg by mouth daily.   . fenofibrate 160 MG tablet TAKE 1 TABLET BY MOUTH EVERY DAY   . Multiple Vitamin (MULTIVITAMINS PO) Take by mouth daily.   04/15/2015: Takes daily   . OMEGA 3 1000 MG CAPS Take 1,000-2,000 mg by mouth daily.  04/15/2015: Takes 2 BID  . pantoprazole (PROTONIX) 40 MG tablet TAKE 1 TABLET (40 MG TOTAL) BY MOUTH 2 (TWO) TIMES DAILY BEFORE A MEAL. 05/03/2018: Uses 1-2x/day, depending on diet  . Probiotic Product ([place]) Take by mouth.   . simethicone (MYLICON) 334 MG chewable tablet Chew 125 mg by mouth every 6 (six) hours as needed. Reported on 11/26/2015 04/28/2017: Rarely needs  . [DISCONTINUED] tadalafil (CIALIS) 5 MG tablet Take 1 tablet (5 mg total) by mouth daily as needed for erectile dysfunction.  Use as directed by physician   . ibuprofen (ADVIL,MOTRIN) 200 MG tablet Take 200-400 mg by mouth as needed. Reported on 11/26/2015   . [DISCONTINUED] ranitidine (RANITIDINE 75) 75 MG tablet Take 75 mg by mouth 2 (two) times daily.      No facility-administered encounter medications on file as of 05/03/2018.     No Known Allergies   ROS: The patient denies  anorexia, fever, weight changes, headaches, vision loss, decreased hearing, ear pain, hoarseness, chest pain, dizziness, syncope, dyspnea on exertion, cough, swelling, nausea, vomiting, constipation, abdominal pain, melena, hematochezia, hematuria, incontinence, erectile dysfunction (resolved), nocturia, weakened urine stream, dysuria, genital lesions, joint pains (just thumbs intermittently), numbness, tingling, weakness, tremor, depression, abnormal bleeding/bruising, or enlarged lymph nodes  Some anxiety related to work stress--unchanged/stable.  Denies insomnia--falls asleep on couch, sometimes has a hard time getting back to sleep when gets into bed. No recurrences of SVT Infrequent diarrhea, related to IBS Heartburn, depending on diet. Allergies/congestion, mild recently Itching/breakout forehead occasional bilateral thumb pain (related to an activity, lifitng something, daughter grabs thumb)   PHYSICAL EXAM:  BP 120/86   Pulse 88   Ht 5' 8.75" (1.746 m)   Wt 167 lb 6.4 oz (75.9 kg)   BMI 24.90 kg/m   128/82 on repeat by MD  Wt Readings from Last 3 Encounters:  10/27/17 168 lb (76.2 kg)  08/26/17 174 lb 12.8 oz (79.3 kg)  04/28/17 183 lb 12.8 oz (83.4 kg)   General Appearance:  Alert, cooperative, no distress, appears stated age, in good spirits  Head:  Normocephalic, without obvious abnormality, atraumatic   Eyes:  PERRL, conjunctiva/corneas clear, EOM's intact, fundi benign   Ears:  Normal TM's and external ear canals   Nose:  Nares normal, mucosa is mildly edematous, no sinus tenderness or drainage  Throat:   Lips, mucosa, and tongue normal; teeth and gums normal   Neck:  Supple, no lymphadenopathy; thyroid: no enlargement/tenderness/ nodules; no carotid bruit or JVD   Back:  Spine nontender, no curvature, ROM normal, no CVA tenderness   Lungs:  Clear to auscultation bilaterally without wheezes, rales or ronchi; respirations unlabored   Chest Wall:  No tenderness or deformity   Heart:  Regular rate and rhythm, S1 and S2 normal, no murmur, rub or gallop   Breast Exam:  No chest wall tenderness, masses or gynecomastia   Abdomen:  Soft, non-tender, nondistended, normoactive bowel sounds, no masses, no hepatosplenomegaly   Genitalia:  Normal male external genitalia without lesions. Testicles without masses. No inguinal hernias.   Rectal:  Normal sphincter tone, no masses or tenderness; guaiac negative stool. Prostate smooth, no nodules, not enlarged.   Extremities:  No clubbing, cyanosis or edema  Pulses:  2+ and symmetric all extremities   Skin:  Skin color, texture, turgor normal.   Lymph nodes:  Cervical, supraclavicular, and inguinal nodes normal   Neurologic:  CNII-XII intact, normal strength, sensation and gait; reflexes 2+ and symmetric throughout   Psych: Normal mood, hygiene and grooming. Full range of affect   Lab Results  Component Value Date   CHOL 126 04/28/2018   HDL 46 04/28/2018   LDLCALC 64 04/28/2018   TRIG 82 04/28/2018   CHOLHDL 2.7 04/28/2018     Chemistry      Component Value Date/Time   NA 139 04/28/2018 1136   K 4.3 04/28/2018 1136   CL 99 04/28/2018 1136   CO2 25 04/28/2018 1136   BUN 15 04/28/2018 1136   CREATININE 1.09 04/28/2018 1136   CREATININE 1.07 04/22/2017 0754      Component Value Date/Time   CALCIUM 10.0 04/28/2018 1136   ALKPHOS 38 (L) 04/28/2018 1136   AST 21 04/28/2018 1136   ALT 22 04/28/2018 1136   BILITOT 0.5 04/28/2018 1136     Fasting glucose 85   ASSESSMENT/PLAN:  Routine general medical  examination at a health care facility - Plan:  POCT Urinalysis DIP (Proadvantage Device), Comprehensive metabolic panel, CBC with Differential/Platelet, Lipid panel  Hypertriglyceridemia - at goal on current regimen, continue - Plan: Lipid panel  Gastroesophageal reflux disease, esophagitis presence not specified - controlled; discussed behavioral measures, diet, qd vs BID, and risks of untreated GERD  Medication monitoring encounter - Plan: Comprehensive metabolic panel, CBC with Differential/Platelet, Lipid panel   Recommended at least 30 minutes of aerobic activity at least 5 days/week, weight-bearing exercise 2-3x/wk; proper sunscreen use reviewed; healthy diet and alcohol recommendations (less than or equal to 2 drinks/day) reviewed; regular seatbelt use; changing batteries in smoke detectors. Self-testicular exams. Immunization recommendations discussed--UTD.  He got his tetanus booster and flu shot last month. Shingrix age 27. Colonoscopy recommendations reviewed--age 28, sooner if guidelines change.  Counseled re: reflux and risks in detail. Consider EGD when he is 89 and going to colonoscopy, sooner if develops dysphagia or worsening reflux despite treatment.   F/u 1 year with fasting labs prior  c-met, lipids, CBC

## 2018-05-03 ENCOUNTER — Encounter: Payer: Self-pay | Admitting: Family Medicine

## 2018-05-03 ENCOUNTER — Ambulatory Visit: Payer: BC Managed Care – PPO | Admitting: Family Medicine

## 2018-05-03 VITALS — BP 120/86 | HR 88 | Ht 68.75 in | Wt 167.4 lb

## 2018-05-03 DIAGNOSIS — K219 Gastro-esophageal reflux disease without esophagitis: Secondary | ICD-10-CM | POA: Diagnosis not present

## 2018-05-03 DIAGNOSIS — Z5181 Encounter for therapeutic drug level monitoring: Secondary | ICD-10-CM | POA: Diagnosis not present

## 2018-05-03 DIAGNOSIS — E781 Pure hyperglyceridemia: Secondary | ICD-10-CM | POA: Diagnosis not present

## 2018-05-03 DIAGNOSIS — Z0000 Encounter for general adult medical examination without abnormal findings: Secondary | ICD-10-CM

## 2018-05-03 LAB — POCT URINALYSIS DIP (PROADVANTAGE DEVICE)
BILIRUBIN UA: NEGATIVE
BILIRUBIN UA: NEGATIVE mg/dL
Blood, UA: NEGATIVE
Glucose, UA: NEGATIVE mg/dL
LEUKOCYTES UA: NEGATIVE
Nitrite, UA: NEGATIVE
PH UA: 5.5 (ref 5.0–8.0)
Protein Ur, POC: NEGATIVE mg/dL
SPECIFIC GRAVITY, URINE: 1.03
Urobilinogen, Ur: NEGATIVE

## 2018-05-03 NOTE — Patient Instructions (Signed)

## 2018-05-04 ENCOUNTER — Encounter: Payer: BC Managed Care – PPO | Admitting: Family Medicine

## 2018-05-26 ENCOUNTER — Ambulatory Visit: Payer: BC Managed Care – PPO | Admitting: Family Medicine

## 2018-05-26 ENCOUNTER — Encounter: Payer: Self-pay | Admitting: Family Medicine

## 2018-05-26 VITALS — BP 120/86 | HR 90 | Temp 98.6°F | Ht 68.75 in | Wt 169.8 lb

## 2018-05-26 DIAGNOSIS — J069 Acute upper respiratory infection, unspecified: Secondary | ICD-10-CM | POA: Diagnosis not present

## 2018-05-26 NOTE — Progress Notes (Signed)
Chief Complaint  Patient presents with  . Cough    x1 week with nasal congestion     A week ago he started with sinus congestion, cough.  Those symptoms improved, but has not completely resolved.  Still blowing his nose a lot--mucus is clear or creamy.  Cough is productive of creamy phlegm, gets mucus up about 4x/week. Denies shortness of breath or wheezing. Can hardly breathe through his nose, still stuffy, though somewhat intermittent.  No fever or chills. Slight sneezing, no itchy eyes.  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  Recently got back from [place].  Since then, daughter had 2 ear infections, and was diagnosed with bronchitis today.  Has not taken any OTC meds.  Took delsym syrup at night and advil early on when had muscle aches. Continues to take zyrtec daily.  PMH, [facility], SH reviewed  Outpatient Encounter Medications as of 05/26/2018  Medication Sig Note  . calcium carbonate (TUMS - DOSED IN MG ELEMENTAL CALCIUM) 500 MG chewable tablet Chew 2 tablets by mouth as needed for indigestion or heartburn. Reported on 11/26/2015 04/28/2017: Prn in the evenings, about once a week or less  . cetirizine (ZYRTEC) 10 MG tablet Take 10 mg by mouth daily.   . fenofibrate 160 MG tablet TAKE 1 TABLET BY MOUTH EVERY DAY   . ibuprofen (ADVIL,MOTRIN) 200 MG tablet Take 200-400 mg by mouth as needed. Reported on 11/26/2015   . Multiple Vitamin (MULTIVITAMINS PO) Take by mouth daily.   04/15/2015: Takes daily   . OMEGA 3 1000 MG CAPS Take 1,000-2,000 mg by mouth daily.  04/15/2015: Takes 2 BID  . pantoprazole (PROTONIX) 40 MG tablet TAKE 1 TABLET (40 MG TOTAL) BY MOUTH 2 (TWO) TIMES DAILY BEFORE A MEAL. 05/03/2018: Uses 1-2x/day, depending on diet  . Probiotic Product ([place]) Take by mouth.   . simethicone (MYLICON) 628 MG chewable tablet Chew 125 mg by mouth every 6 (six) hours as needed. Reported on 11/26/2015 04/28/2017: Rarely needs  .  [DISCONTINUED] ranitidine (RANITIDINE 75) 75 MG tablet Take 75 mg by mouth 2 (two) times daily.      No facility-administered encounter medications on file as of 05/26/2018.    No Known Allergies  ROS:  See HPI.  No nausea, vomiting, diarrhea, bleeding, bruising, rash, chest pain, palpitations, shortness of breath, headache, dizziness.  PHYSICAL EXAM: BP 120/86   Pulse 90   Temp 98.6 F (37 C) (Oral)   Ht 5' 8.75" (1.746 m)   Wt 169 lb 12.8 oz (77 kg)   SpO2 98%   BMI 25.26 kg/m   Well appearing, pleasant male, in no distress HEENT: conjunctiva and sclera are clear, EOMI. TM's and EAC's normal. Nasal mucosa is normal, only mildly edematous, no erythema, no drainage.  Sinuses are nontender. OP is clear Neck: no lymphadenopathy, thyromegaly or mass Heart: regular rate and rhythm Lungs: clear bilaterally. No wheezes, rales, ronchi Skin: normal turgor, no rash Psych normal mood, affect, hygiene and grooming Neuro: alert and oriented, cranial nerves intact, normal gait   ASSESSMENT/PLAN  Viral upper respiratory illness  Supportive measures reviewed. S/sx bacterial infection reviewed, contact us or return if worsening.

## 2018-05-26 NOTE — Patient Instructions (Signed)
Drink plenty of water. Continue your zyrtec Consider sinus rinses as needed. Add in taking guaifenesin (mucinex/robitussin) to keep the mucus and phlegm thin. You may continue delsym syrup as needed for cough.  Contact us if you develop fever, discolored mucus or phlegm. Your symptoms should likely improve over the next 3-5 days.  If they worsen, it is likely a secondary bacterial infection and will need antibiotics. If you develop shortness of breath or pain with breathing, please seek immediate re-evaluation.

## 2018-08-28 ENCOUNTER — Other Ambulatory Visit: Payer: Self-pay | Admitting: Family Medicine

## 2018-08-28 DIAGNOSIS — K219 Gastro-esophageal reflux disease without esophagitis: Secondary | ICD-10-CM

## 2018-10-09 ENCOUNTER — Other Ambulatory Visit: Payer: Self-pay | Admitting: Family Medicine

## 2018-10-09 DIAGNOSIS — E781 Pure hyperglyceridemia: Secondary | ICD-10-CM

## 2019-01-14 ENCOUNTER — Encounter: Payer: Self-pay | Admitting: Family Medicine

## 2019-02-27 ENCOUNTER — Other Ambulatory Visit: Payer: Self-pay | Admitting: Family Medicine

## 2019-02-27 DIAGNOSIS — K219 Gastro-esophageal reflux disease without esophagitis: Secondary | ICD-10-CM

## 2019-03-13 ENCOUNTER — Encounter: Payer: Self-pay | Admitting: Family Medicine

## 2019-03-13 ENCOUNTER — Ambulatory Visit: Payer: BC Managed Care – PPO | Admitting: Family Medicine

## 2019-03-13 ENCOUNTER — Other Ambulatory Visit: Payer: Self-pay

## 2019-03-13 VITALS — BP 133/90 | HR 82 | Ht 68.75 in | Wt 167.0 lb

## 2019-03-13 DIAGNOSIS — F419 Anxiety disorder, unspecified: Secondary | ICD-10-CM | POA: Diagnosis not present

## 2019-03-13 MED ORDER — ALPRAZOLAM 0.25 MG PO TABS: 0.2500 mg | ORAL_TABLET | Freq: Three times a day (TID) | ORAL | 0 refills | Status: DC | PRN

## 2019-03-13 NOTE — Progress Notes (Signed)
Start time: 11:27 End time: 11:54   Virtual Visit via Video Note  I connected with Jordan Stafford on 03/13/19  by a video enabled telemedicine application and verified that I am speaking with the correct person using two identifiers.  Location: Patient: home, alone Provider: office   I discussed the limitations of evaluation and management by telemedicine and the availability of in person appointments. The patient expressed understanding and agreed to proceed.  History of Present Illness:  Chief Complaint  Patient presents with  . Anxiety    VIRTUAL visit for anxiety related to stress.    Patient was worked in today with complaint if stress and anxiety.  He reports that stress and anxiety coming to a peak recently. Been home with [employer] since March, working from home. He was supposed to return to in-person classes today.  He reports he is being asked to do more than he feels comfortable with. He reports that he has more in-person (FTF) time than his colleagues. 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  This last minute change (and not eliminating an in-person class, but instead another online one) set his anxiety off. He has been having trouble sleeping, concentrating, and crying periodically since Friday night.  He is concerned with the amount of time he will be spending in class, as well as the required office hours.    He states his office is a "renovated closet"--is extremely small (less than 6 feet distance from another person), with poor air circulation/ventilation.  He is required to hold walk-in hours from this office.  He feels safer in a classroom than his office, has better ventilation. He asked about getting another space/class to hold office hours, but was getting the runaround. He didn't go to work today, took a sick day Crying, didn't get any sleep. Sense of foreboding since Friday  He  is worried that his daughter starts Pre-K, wife is an essential Dietitian. Worried about his exposure to COVID at school, and potentially spreading to his parents.  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  Outpatient Encounter Medications as of 03/13/2019  Medication Sig Note  . calcium carbonate (TUMS - DOSED IN MG ELEMENTAL CALCIUM) 500 MG chewable tablet Chew 2 tablets by mouth as needed for indigestion or heartburn. Reported on 11/26/2015 03/13/2019: Taking 2-3 times a week  . fenofibrate 160 MG tablet TAKE 1 TABLET BY MOUTH EVERY DAY   . Multiple Vitamin (MULTIVITAMINS PO) Take by mouth daily.   04/15/2015: Takes daily   . OMEGA 3 1000 MG CAPS Take 1,000-2,000 mg by mouth daily.  03/13/2019: Takes 2 daily  . pantoprazole (PROTONIX) 40 MG tablet TAKE 1 TABLET (40 MG TOTAL) BY MOUTH 2 (TWO) TIMES DAILY BEFORE A MEAL. 03/13/2019: Is now taking 2 daily  . ALPRAZolam (XANAX) 0.25 MG tablet Take 1-2 tablets (0.25-0.5 mg total) by mouth 3 (three) times daily as needed for anxiety or sleep.   . cetirizine (ZYRTEC) 10 MG tablet Take 10 mg by mouth daily.   Marland Kitchen ibuprofen (ADVIL,MOTRIN) 200 MG tablet Take 200-400 mg by mouth as needed. Reported on 11/26/2015   . Probiotic Product ([place]) Take by mouth.   . simethicone (MYLICON) 196 MG chewable tablet Chew 125 mg by mouth every 6 (six) hours as needed. Reported  on 11/26/2015 04/28/2017: Rarely needs  . [DISCONTINUED] ranitidine (RANITIDINE 75) 75 MG tablet Take 75 mg by mouth 2 (two) times daily.      No facility-administered encounter medications on file as of 03/13/2019.    (NOT taking alprazolam prior to today's visit)  No Known Allergies  ROS:  +anxiety, insomnia, crying, decreased appetite. No fever, chills, GI complaints, or other concerns. No chest pain, tachycardia.    Observations/Objective:  BP 133/90   Pulse 82   Ht 5' 8.75"  (1.746 m)   Wt 167 lb (75.8 kg)   SpO2 97%   BMI 24.84 kg/m   Anxious appearing male, flat facial expression, anxious/upset/agitated Smiled when daughter was present, otherwise limited range of affect He is alert, oriented. Normal grooming, eye contact, speech.   Assessment and Plan:  Anxiety - Plan: ALPRAZolam (XANAX) 0.25 MG tablet Risks/SE and proper/limited use of alprazolam reviewed in detail.  Encouraged him to contact his supervisor to discuss his concerns--at least with regard to his office hours--being able to hold them by appt, in a different location (outside, another class) vs virtual office hours. Encouraged him to contact E[employer] and get counseling, to help reduce his anxiety and reaction to stress. Briefly discussed breathing techniques/relaxation. Use alprazolam sparingly.  Discussed safety precautions related to COVID. Discussed that given his daughter's Pre-K, wife's work, and his many in-person contacts, likely shouldn't be having close contacts with his parents at this time.  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   Follow Up Instructions:    I discussed the assessment and treatment plan with the patient. The patient was provided an opportunity to ask questions and all were answered. The patient agreed with the plan and demonstrated an understanding of the instructions.   The patient was advised to call back or seek an in-person evaluation if the symptoms worsen or if the condition fails to improve as anticipated.  I provided 27 minutes of non-face-to-face time during this encounter.   Vikki Ports, MD

## 2019-03-13 NOTE — Patient Instructions (Addendum)
We discussed not holding office hours in your office if it is a confined space without adequate ventilation or room to meet face to face with students. Alternatives could be hours by appointment, and finding another space (classroom, or outside), versus holding virtual office hours as safer alternatives.  I encourage you to contact Employee Assistance Program to meet with a therapist regarding stress, stress reduction techniques. Use the alprazolam sparingly, when other measures are not reducing your anxiety. Do not drive while taking, do not mix with alcohol.    Please give me an update with how things are going in the next couple of weeks. If anxiety persists/worsens, despite counseling and trying to address the sources of anxiety, we may need to start additional medications.  But since this just started since changes were suddenly made on Friday, let's see how things work out.   Mindfulness-Based Stress Reduction Mindfulness-based stress reduction (MBSR) is a program that helps people learn to practice mindfulness. Mindfulness is the practice of intentionally paying attention to the present moment. It can be learned and practiced through techniques such as education, breathing exercises, meditation, and yoga. MBSR includes several mindfulness techniques in one program. MBSR works best when you understand the treatment, are willing to try new things, and can commit to spending time practicing what you learn. MBSR training may include learning about:  How your emotions, thoughts, and reactions affect your body.  New ways to respond to things that cause negative thoughts to start (triggers).  How to notice your thoughts and let go of them.  Practicing awareness of everyday things that you normally do without thinking.  The techniques and goals of different types of meditation. What are the benefits of MBSR? MBSR can have many benefits, which include helping you to:  Develop  self-awareness. This refers to knowing and understanding yourself.  Learn skills and attitudes that help you to participate in your own health care.  Learn new ways to care for yourself.  Be more accepting about how things are, and let things go.  Be less judgmental and approach things with an open mind.  Be patient with yourself and trust yourself more. MBSR has also been shown to:  Reduce negative emotions, such as depression and anxiety.  Improve memory and focus.  Change how you sense and approach pain.  Boost your body's ability to fight infections.  Help you connect better with other people.  Improve your sense of well-being. Follow these instructions at home:   Find a local in-person or online MBSR program.  Set aside some time regularly for mindfulness practice.  Find a mindfulness practice that works best for you. This may include one or more of the following: ? Meditation. Meditation involves focusing your mind on a certain thought or activity. ? Breathing awareness exercises. These help you to stay present by focusing on your breath. ? Body scan. For this practice, you lie down and pay attention to each part of your body from head to toe. You can identify tension and soreness and intentionally relax parts of your body. ? Yoga. Yoga involves stretching and breathing, and it can improve your ability to move and be flexible. It can also provide an experience of testing your body's limits, which can help you release stress. ? Mindful eating. This way of eating involves focusing on the taste, texture, color, and smell of each bite of food. Because this slows down eating and helps you feel full sooner, it can be an important part  of a weight-loss plan.  Find a podcast or recording that provides guidance for breathing awareness, body scan, or meditation exercises. You can listen to these any time when you have a free moment to rest without distractions.  Follow your  treatment plan as told by your health care provider. This may include taking regular medicines and making changes to your diet or lifestyle as recommended. How to practice mindfulness To do a basic awareness exercise:  Find a comfortable place to sit.  Pay attention to the present moment. Observe your thoughts, feelings, and surroundings just as they are.  Avoid placing judgment on yourself, your feelings, or your surroundings. Make note of any judgment that comes up, and let it go.  Your mind may wander, and that is okay. Make note of when your thoughts drift, and return your attention to the present moment. To do basic mindfulness meditation:  Find a comfortable place to sit. This may include a stable chair or a firm floor cushion. ? Sit upright with your back straight. Let your arms fall next to your side with your hands resting on your legs. ? If sitting in a chair, rest your feet flat on the floor. ? If sitting on a cushion, cross your legs in front of you.  Keep your head in a neutral position with your chin dropped slightly. Relax your jaw and rest the tip of your tongue on the roof of your mouth. Drop your gaze to the floor. You can close your eyes if you like.  Breathe normally and pay attention to your breath. Feel the air moving in and out of your nose. Feel your belly expanding and relaxing with each breath.  Your mind may wander, and that is okay. Make note of when your thoughts drift, and return your attention to your breath.  Avoid placing judgment on yourself, your feelings, or your surroundings. Make note of any judgment or feelings that come up, let them go, and bring your attention back to your breath.  When you are ready, lift your gaze or open your eyes. Pay attention to how your body feels after the meditation. Where to find more information You can find more information about MBSR from:  Your health care provider.  Community-based meditation centers or programs.   Programs offered near you. Summary  Mindfulness-based stress reduction (MBSR) is a program that teaches you how to intentionally pay attention to the present moment. It is used with other treatments to help you cope better with daily stress, emotions, and pain.  MBSR focuses on developing self-awareness, which allows you to respond to life stress without judgment or negative emotions.  MBSR programs may involve learning different mindfulness practices, such as breathing exercises, meditation, yoga, body scan, or mindful eating. Find a mindfulness practice that works best for you, and set aside time for it on a regular basis. This information is not intended to replace advice given to you by your health care provider. Make sure you discuss any questions you have with your health care provider. Document Released: 11/19/2016 Document Revised: 06/25/2017 Document Reviewed: 11/19/2016 Elsevier Patient Education  2020 [place]: How to Protect Yourself and Others Know how it spreads  There is currently no vaccine to prevent coronavirus disease 2019 (COVID-19).  The best way to prevent illness is to avoid being exposed to this virus.  The virus is thought to spread mainly from person-to-person. ? Between people who are in close contact with one another (within  about 6 feet). ? Through respiratory droplets produced when an infected person coughs, sneezes or talks. ? These droplets can land in the mouths or noses of people who are nearby or possibly be inhaled into the lungs. ? Some recent studies have suggested that COVID-19 may be spread by people who are not showing symptoms. Everyone should Clean your hands often  Wash your hands often with soap and water for at least 20 seconds especially after you have been in a public place, or after blowing your nose, coughing, or sneezing.  If soap and water are not readily available, use a hand sanitizer that contains at least 60%  alcohol. Cover all surfaces of your hands and rub them together until they feel dry.  Avoid touching your eyes, nose, and mouth with unwashed hands. Avoid close contact  Stay home if you are sick.  Avoid close contact with people who are sick.  Put distance between yourself and other people. ? Remember that some people without symptoms may be able to spread virus. ? This is especially important for people who are at higher risk of getting very [employer]cy Cover your mouth and nose with a cloth face cover when around others  You could spread COVID-19 to others even if you do not feel sick.  Everyone should wear a cloth face cover when they have to go out in public, for example to the grocery store or to pick up other necessities. ? Cloth face coverings should not be placed on young children under age 64, anyone who has trouble breathing, or is unconscious, incapacitated or otherwise unable to remove the mask without assistance.  The cloth face cover is meant to protect other people in case you are infected.  Do NOT use a facemask meant for a Dietitian.  Continue to keep about 6 feet between yourself and others. The cloth face cover is not a substitute for social distancing. Cover coughs and sneezes  If you are in a private setting and do not have on your cloth face covering, remember to always cover your mouth and nose with a tissue when you cough or sneeze or use the inside of your elbow.  Throw used tissues in the trash.  Immediately wash your hands with soap and water for at least 20 seconds. If soap and water are not readily available, clean your hands with a hand sanitizer that contains at least 60% alcohol. Clean and disinfect  Clean AND disinfect frequently touched surfaces daily. This includes tables, doorknobs, light switches, countertops, handles, desks, phones, keyboards, toilets, faucets,  and sinks. [employer]ackRewards[employer]fr  If surfaces are dirty, clean them: Use detergent or soap and water prior to disinfection.  Then, use a household disinfectant. You can see a list of EPA-registered household disinfectants here. michellinders.com 11/29/2018 This information is not intended to replace advice given to you by your health care provider. Make sure you discuss any questions you have with your health care provider. Document Released: 11/08/2018 Document Revised: 12/07/2018 Document Reviewed: 11/08/2018 Elsevier Patient Education  [place].

## 2019-04-10 ENCOUNTER — Other Ambulatory Visit: Payer: Self-pay

## 2019-04-10 ENCOUNTER — Other Ambulatory Visit (INDEPENDENT_AMBULATORY_CARE_PROVIDER_SITE_OTHER): Payer: BC Managed Care – PPO

## 2019-04-10 DIAGNOSIS — Z23 Encounter for immunization: Secondary | ICD-10-CM

## 2019-04-10 DIAGNOSIS — Z0183 Encounter for blood typing: Secondary | ICD-10-CM

## 2019-04-11 LAB — ABO AND RH: Rh Factor: POSITIVE

## 2019-05-16 ENCOUNTER — Other Ambulatory Visit: Payer: BC Managed Care – PPO

## 2019-05-16 ENCOUNTER — Other Ambulatory Visit: Payer: Self-pay

## 2019-05-16 DIAGNOSIS — Z0000 Encounter for general adult medical examination without abnormal findings: Secondary | ICD-10-CM

## 2019-05-16 DIAGNOSIS — E781 Pure hyperglyceridemia: Secondary | ICD-10-CM

## 2019-05-16 DIAGNOSIS — Z5181 Encounter for therapeutic drug level monitoring: Secondary | ICD-10-CM

## 2019-05-16 LAB — CBC WITH DIFFERENTIAL/PLATELET
Basophils Absolute: 0 10*3/uL (ref 0.0–0.2)
Basos: 0 %
EOS (ABSOLUTE): 0.1 10*3/uL (ref 0.0–0.4)
Eos: 2 %
Hematocrit: 41.2 % (ref 37.5–51.0)
Hemoglobin: 14 g/dL (ref 13.0–17.7)
Immature Grans (Abs): 0 10*3/uL (ref 0.0–0.1)
Immature Granulocytes: 0 %
Lymphocytes Absolute: 1.7 10*3/uL (ref 0.7–3.1)
Lymphs: 33 %
MCH: 30.3 pg (ref 26.6–33.0)
MCHC: 34 g/dL (ref 31.5–35.7)
MCV: 89 fL (ref 79–97)
Monocytes Absolute: 0.5 10*3/uL (ref 0.1–0.9)
Monocytes: 9 %
Neutrophils Absolute: 2.9 10*3/uL (ref 1.4–7.0)
Neutrophils: 56 %
Platelets: 225 10*3/uL (ref 150–450)
RBC: 4.62 x10E6/uL (ref 4.14–5.80)
RDW: 12.4 % (ref 11.6–15.4)
WBC: 5.2 10*3/uL (ref 3.4–10.8)

## 2019-05-16 LAB — COMPREHENSIVE METABOLIC PANEL
ALT: 28 IU/L (ref 0–44)
AST: 25 IU/L (ref 0–40)
Albumin/Globulin Ratio: 2.5 — ABNORMAL HIGH (ref 1.2–2.2)
Albumin: 4.9 g/dL (ref 4.0–5.0)
Alkaline Phosphatase: 46 IU/L (ref 39–117)
BUN/Creatinine Ratio: 18 (ref 9–20)
BUN: 19 mg/dL (ref 6–24)
Bilirubin Total: 0.5 mg/dL (ref 0.0–1.2)
CO2: 25 mmol/L (ref 20–29)
Calcium: 9.9 mg/dL (ref 8.7–10.2)
Chloride: 103 mmol/L (ref 96–106)
Creatinine, Ser: 1.06 mg/dL (ref 0.76–1.27)
GFR calc Af Amer: 95 mL/min/{1.73_m2} (ref >59)
GFR calc non Af Amer: 83 mL/min/{1.73_m2} (ref >59)
Globulin, Total: 2 g/dL (ref 1.5–4.5)
Glucose: 101 mg/dL — ABNORMAL HIGH (ref 65–99)
Potassium: 4.8 mmol/L (ref 3.5–5.2)
Sodium: 141 mmol/L (ref 134–144)
Total Protein: 6.9 g/dL (ref 6.0–8.5)

## 2019-05-16 LAB — LIPID PANEL
Chol/HDL Ratio: 2.9 ratio (ref 0.0–5.0)
Cholesterol, Total: 134 mg/dL (ref 100–199)
HDL: 47 mg/dL (ref >39)
LDL Chol Calc (NIH): 74 mg/dL (ref 0–99)
Triglycerides: 59 mg/dL (ref 0–149)
VLDL Cholesterol Cal: 13 mg/dL (ref 5–40)

## 2019-05-17 NOTE — Patient Instructions (Addendum)
  HEALTH MAINTENANCE RECOMMENDATIONS:  It is recommended that you get at least 30 minutes of aerobic exercise at least 5 days/week (for weight loss, you may need as much as 60-90 minutes). This can be any activity that gets your heart rate up. This can be divided in 10-15 minute intervals if needed, but try and build up your endurance at least once a week.  Weight bearing exercise is also recommended twice weekly.  Eat a healthy diet with lots of vegetables, fruits and fiber.  "Colorful" foods have a lot of vitamins (ie green vegetables, tomatoes, red peppers, etc).  Limit sweet tea, regular sodas and alcoholic beverages, all of which has a lot of calories and sugar.  Up to 2 alcoholic drinks daily may be beneficial for men (unless trying to lose weight, watch sugars).  Drink a lot of water.  Sunscreen of at least SPF 30 should be used on all sun-exposed parts of the skin when outside between the hours of 10 am and 4 pm (not just when at beach or pool, but even with exercise, golf, tennis, and yard work!)  Use a sunscreen that says "broad spectrum" so it covers both UVA and UVB rays, and make sure to reapply every 1-2 hours.  Remember to change the batteries in your smoke detectors when changing your clock times in the spring and fall. Carbon monoxide detectors are recommended for your home.  Use your seat belt every time you are in a car, and please drive safely and not be distracted with cell phones and texting while driving.  Guidelines are changing for colon cancer screening, now recommended at age 95 (rather than 4).  Please check with your insurance to see if this is covered, and if so, we will refer now (rather than waiting until age 33).  This would be for routine screening (not related to symptom or concern.)  You had occult blood on the stool sample today (from the rectal exam).  This may not be significant (ie hemorrhoid or fissure), but should be further evaluated.  Since colon cancer  screening is recommended with colonoscopy, if covered, that would be the appropriate next step.  If it is not yet covered, I would recommend repeating a normally passed stool (not from a rectal exam) sometime in the next few weeks, to re-evaluated.  If there is persistent blood in the stool, then colonoscopy is still indicated (done not for screening, but for occult blood in the stool).   Please let us know what your insurance says.  If not covered, we will mail you a kit.  We discussed counseling as well as potentially using preventative medications (ie paxil, zoloft, lexapro) to treat generalized anxiety.  Let me know if this is something you are interested in trying, after you talk to your wife.   If you are interested in counseling, you can set this up yourself (either through [facility] through work, or I like [facility] on [employer], vs [facility], vs other places--check with your insurance).

## 2019-05-17 NOTE — Progress Notes (Signed)
Chief Complaint  Patient presents with  . Annual Exam    nonfasting annual exam. No new concerns.    Jordan Stafford is a 48 y.o. male who presents for a complete physical.  He has the following concerns:  He was last seen in August, when having significant anxiety and stress related to resuming in-person classes and office hours at school.  He was prescribed alprazolam to use if needed, counseling was encouraged. He has not gotten counseling.  He filled the alprazolam, but hasn't needed to use it. He reports he has broken 4 teeth, likely due to grinding, from stress/anxiety.  He is getting them repaired, then will be getting a biteguard.  Patient wasn't aware of the broken teeth. Stress--no family dinners, no travel, all he does his work, care for daughter and repeat the cycle. 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  Hypertriglyceridemia: He is taking fenofibrate regularly without side effects (upsets his stomach if taken without food). He triesto follow a lowfat, low cholesterol diet. He had labs done prior to today's visit, see below.  Impaired fasting glucose: He tries to limit his sugar and sweets.  Had labs prior to visit, see below--had a slice of apple that morning (so not completely fasting). Prior A1c's have been normal. Lab Results  Component Value Date   HGBA1C 5.3 10/21/2017   GERD: on protonix--taking it usually once daily, but twice daily when he eats foods which may trigger, or if heartburn persists, takes BID for a few days. He uses Tums prn in the evenings (needed only occasionally, when taking the protonix in morning only), or the middle of the night if he wakes up with a sour stomach (once in the last week)  Denies dysphagia,wheezing.  H/o elevated blood pressures--Hehas an [employer] wrist monitor to check at home--it seemed to run a little high when compared to manual BP in the officein  the past, but values at home were not elevated. Checks sporadically, last was 116/70's a couple of weeks ago.  Hasn't seen any high numbers (if elevated, repeat is lower). 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 Denies chest pain, palpitations.  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 Had normal stress test 08/2013. He has had no further episodes of SVT since 2008. Released from care ofcardiologist in7/2015.    Immunization History  Administered Date(s) Administered  . Influenza Split 05/27/2011  . Influenza,inj,Quad PF,6+ Mos 04/11/2014, 04/15/2015, 04/15/2016, 04/12/2017, 03/29/2018, 04/10/2019  . Tdap 07/25/2008, 04/14/2018  . Varicella 05/18/2017, 06/22/2017   Last colonoscopy: never  Last PSA: never  Dentist: twice yearly  Ophtho: once yearly  Exercise:"not much"  Walks briskly at work, but <10 minutes. +weight-bearing lifting daughter.  Yardwork, mowing yard.  Past Medical History:  Diagnosis Date  . Acne   . Anxiety   . GERD (gastroesophageal reflux disease)   . Hx of cardiovascular stress test    ETT-Myoview (08/2013):  No ischemia, EF 67%; normal study.  . IBS (irritable bowel syndrome)   . Renal stone   . Seasonal allergies   . Supraventricular tachycardia    Dr Caryl Comes    Past Surgical History:  Procedure Laterality Date  . MOUTH SURGERY     x3  . TOENAIL EXCISION     ingrown nail    Social History   Socioeconomic History  . Marital  status: Married    Spouse name: Not on file  . Number of children: 1  . Years of education: Not on file  . Highest education level: Not on file  Occupational History  . Occupation: professor of Biomedical engineer: [employer]  Social Needs  . Financial resource strain: Not on file  . Food insecurity    Worry: Not on file    Inability: Not on file  . Transportation needs    Medical: Not on file    Non-medical: Not  on file  Tobacco Use  . Smoking status: Never Smoker  . Smokeless tobacco: Never Used  Substance and Sexual Activity  . Alcohol use: No  . Drug use: No  . Sexual activity: Yes    Partners: Female  Lifestyle  . Physical activity    Days per week: Not on file    Minutes per session: Not on file  . Stress: Not on file  Relationships  . Social Herbalist on phone: Not on file    Gets together: Not on file    Attends religious service: Not on file    Active member of club or organization: Not on file    Attends meetings of clubs or organizations: Not on file    Relationship status: Not on file  . Intimate partner violence    Fear of current or ex partner: Not on file    Emotionally abused: Not on file    Physically abused: Not on file    Forced sexual activity: Not on file  Other Topics Concern  . Not on file  Social History Narrative   Married 12/2011, has a cockapoo. Has a daughter, Bubba Hales (born 09/02/14). Teaches Math at [employer]       Family History  Problem Relation Age of Onset  . [place] White syndrome Mother   . Hypertension Mother   . COPD Mother        smoker  . Urolithiasis Mother   . Diabetes Father   . Hypertension Father   . Cancer Father 22       renal; bladder CA at 76--removed bladder, prostate and 1 kidney age 64)  . Kidney cancer Father   . Heart disease Maternal Grandmother        MI at 93  . Cancer Paternal Grandfather        lung  . Heart disease Paternal Grandmother        late 52's  . Coronary artery disease Neg Hx     Outpatient Encounter Medications as of 05/18/2019  Medication Sig Note  . calcium carbonate (TUMS - DOSED IN MG ELEMENTAL CALCIUM) 500 MG chewable tablet Chew 2 tablets by mouth as needed for indigestion or heartburn. Reported on 11/26/2015 03/13/2019: Taking 2-3 times a week  . fenofibrate 160 MG tablet TAKE 1 TABLET BY MOUTH EVERY DAY   . Multiple Vitamin (MULTIVITAMINS PO) Take by mouth daily.   04/15/2015: Takes  daily   . OMEGA 3 1000 MG CAPS Take 1,000-2,000 mg by mouth daily.  03/13/2019: Takes 2 daily  . pantoprazole (PROTONIX) 40 MG tablet TAKE 1 TABLET (40 MG TOTAL) BY MOUTH 2 (TWO) TIMES DAILY BEFORE A MEAL. 05/18/2019: One daily  . ALPRAZolam (XANAX) 0.25 MG tablet Take 1-2 tablets (0.25-0.5 mg total) by mouth 3 (three) times daily as needed for anxiety or sleep. (Patient not taking: Reported on 05/18/2019)   . cetirizine (ZYRTEC) 10 MG tablet Take  10 mg by mouth daily.   Marland Kitchen ibuprofen (ADVIL,MOTRIN) 200 MG tablet Take 200-400 mg by mouth as needed. Reported on 11/26/2015   . simethicone (MYLICON) 299 MG chewable tablet Chew 125 mg by mouth every 6 (six) hours as needed. Reported on 11/26/2015 04/28/2017: Rarely needs  . [DISCONTINUED] Probiotic Product ([place]) Take by mouth.   . [DISCONTINUED] ranitidine (RANITIDINE 75) 75 MG tablet Take 75 mg by mouth 2 (two) times daily.      No facility-administered encounter medications on file as of 05/18/2019.    No Known Allergies  ROS: The patient denies anorexia, fever, weight changes, headaches (other than some at neck, behind ears, per HPI), vision loss, decreased hearing, ear pain, hoarseness, chest pain, dizziness, syncope, dyspnea on exertion, cough, swelling, nausea, vomiting, constipation, abdominal pain, melena, hematochezia, hematuria, incontinence, erectile dysfunction, nocturia, weakened urine stream, dysuria, genital lesions, joint pains, numbness, tingling, weakness, tremor, depression, abnormal bleeding/bruising, or enlarged lymph nodes  Some anxiety related to work stress, per HPI No recurrences of SVT Infrequent diarrhea, related to IBS (not any worse) Heartburn, depending on diet. Dry skin on hands.   PHYSICAL EXAM:  BP (!) 140/100   Pulse 80   Temp 98.2 F (36.8 C) (Other (Comment))   Ht 5' 8.75" (1.746 m)   Wt 170 lb 12.8 oz (77.5 kg)   BMI 25.41 kg/m   Wt Readings from Last 3 Encounters:  05/18/19 170 lb 12.8  oz (77.5 kg)  03/13/19 167 lb (75.8 kg)  05/26/18 169 lb 12.8 oz (77 kg)   BP 128/88 on repeat by MD  General Appearance:  Alert, cooperative, no distress, appears stated age, in good spirits  Head:  Normocephalic, without obvious abnormality, atraumatic   Eyes:  PERRL, conjunctiva/corneas clear, EOM's intact, fundi benign   Ears:  Normal TM's and external ear canals   Nose:  Not examined, wearing mask due to COVID-19 pandemic  Throat:  Not examined, wearing mask due to COVID-19 pandemic   Neck:  Supple, no lymphadenopathy; thyroid: no enlargement/tenderness/ nodules; no carotid bruit or JVD   Back:  Spine nontender, no curvature, ROM normal, no CVA tenderness   Lungs:  Clear to auscultation bilaterally without wheezes, rales or ronchi; respirations unlabored   Chest Wall:  No tenderness or deformity   Heart:  Regular rate and rhythm, S1 and S2 normal, no murmur, rub or gallop   Breast Exam:  No chest wall tenderness, masses or gynecomastia   Abdomen:  Soft, non-tender, nondistended, normoactive bowel sounds, no masses, no hepatosplenomegaly   Genitalia:  Normal male external genitalia without lesions. Testicles without masses. No inguinal hernias.   Rectal:  Normal sphincter tone, no masses or tenderness; only trace stool present, tested heme +. Prostate smooth, no nodules, not enlarged.   Extremities:  No clubbing, cyanosis or edema  Pulses:  2+ and symmetric all extremities   Skin:  Skin color, texture, turgor normal.  Lymph nodes:  Cervical, supraclavicular, and inguinal nodes normal   Neurologic:  CNII-XII intact, normal strength, sensation and gait; reflexes 2+ and symmetric throughout   Psych: Normal mood, hygiene and grooming. Full range of affect  GAD-7 score of 7 PHQ-9 score of 2   Lab Results  Component Value Date   CHOL 134 05/16/2019   HDL 47 05/16/2019   LDLCALC 74 05/16/2019   TRIG 59 05/16/2019   CHOLHDL 2.9  05/16/2019     Chemistry      Component Value Date/Time  NA 141 05/16/2019 0841   K 4.8 05/16/2019 0841   CL 103 05/16/2019 0841   CO2 25 05/16/2019 0841   BUN 19 05/16/2019 0841   CREATININE 1.06 05/16/2019 0841   CREATININE 1.07 04/22/2017 0754      Component Value Date/Time   CALCIUM 9.9 05/16/2019 0841   ALKPHOS 46 05/16/2019 0841   AST 25 05/16/2019 0841   ALT 28 05/16/2019 0841   BILITOT 0.5 05/16/2019 0841     Fasting glucose 446--XFQ a slice of apple that morning.  Lab Results  Component Value Date   WBC 5.2 05/16/2019   HGB 14.0 05/16/2019   HCT 41.2 05/16/2019   MCV 89 05/16/2019   PLT 225 05/16/2019    ASSESSMENT/PLAN:   Routine general medical examination at a health care facility - Plan: CBC with Differential/Platelet, PSA, Comprehensive metabolic panel, Lipid panel  Impaired fasting glucose - continues to be improved (101 wasn't entirely fasting). discussed proper diet, daily exercise - Plan: Comprehensive metabolic panel  Medication monitoring encounter - Plan: CBC with Differential/Platelet, Comprehensive metabolic panel, Lipid panel  Paroxysmal supraventricular tachycardia ([place]), Chronic - hasn't had symptoms in years.  Reviewed triggers to avoid  Gastroesophageal reflux disease - controlled - Plan: pantoprazole (PROTONIX) 40 MG tablet  Hypertriglyceridemia - well controlled on current regimen - Plan: fenofibrate 160 MG tablet, Lipid panel  Colon cancer screening - discussed new guidelines, recommended colonoscopy, will check insurance coverage. 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 Counseled some--encouraged counseling, consider meds (wants to d/w wife)   Heme + trace No further testing if getting colonoscopy If not covered, needs stool test sent  Counseled re: anxiety, how it is  affecting his body, stress reduction, how counseling could benefit. Also discussed how meds could benefit.  If he is truly grinding teeth and causing fractures, to consider medications (briefly mentioned paxil, zoloft, lexapro)--he wanted to discuss with his wife prior to starting any treatment.  Recommended at least 30 minutes of aerobic activity at least 5 days/week, weight-bearing exercise 2-3x/wk; proper sunscreen use reviewed; healthy diet and alcohol recommendations (less than or equal to 2 drinks/day) reviewed; regular seatbelt use; changing batteries in smoke detectors. Self-testicular exams. Immunization recommendations discussed--UTD. Continue yearly flu shots. Likely won't need Shingrix right at 65, since he only had varicella vaccines in 2018--advised no rush, can hold off; likelihood of getting shingles is not zero, but is lower than population who has had varicella infection.  Colonoscopy recommendations reviewed--now, if insurance covers (rather than waiting until 69). If not covered, recommend rechecking hemoccult from normally passed stool (in case fissure/hemorrhoid contaminating today's sample from DRE).   F/u 1 year with fasting labs prior, sooner prn.

## 2019-05-18 ENCOUNTER — Other Ambulatory Visit: Payer: Self-pay

## 2019-05-18 ENCOUNTER — Encounter: Payer: Self-pay | Admitting: Family Medicine

## 2019-05-18 ENCOUNTER — Ambulatory Visit (INDEPENDENT_AMBULATORY_CARE_PROVIDER_SITE_OTHER): Payer: BC Managed Care – PPO | Admitting: Family Medicine

## 2019-05-18 VITALS — BP 128/88 | HR 80 | Temp 98.2°F | Ht 68.75 in | Wt 170.8 lb

## 2019-05-18 DIAGNOSIS — Z0000 Encounter for general adult medical examination without abnormal findings: Secondary | ICD-10-CM

## 2019-05-18 DIAGNOSIS — Z125 Encounter for screening for malignant neoplasm of prostate: Secondary | ICD-10-CM

## 2019-05-18 DIAGNOSIS — R7301 Impaired fasting glucose: Secondary | ICD-10-CM | POA: Diagnosis not present

## 2019-05-18 DIAGNOSIS — Z5181 Encounter for therapeutic drug level monitoring: Secondary | ICD-10-CM | POA: Diagnosis not present

## 2019-05-18 DIAGNOSIS — K219 Gastro-esophageal reflux disease without esophagitis: Secondary | ICD-10-CM

## 2019-05-18 DIAGNOSIS — I471 Supraventricular tachycardia: Secondary | ICD-10-CM

## 2019-05-18 DIAGNOSIS — F419 Anxiety disorder, unspecified: Secondary | ICD-10-CM

## 2019-05-18 DIAGNOSIS — Z1211 Encounter for screening for malignant neoplasm of colon: Secondary | ICD-10-CM

## 2019-05-18 DIAGNOSIS — E781 Pure hyperglyceridemia: Secondary | ICD-10-CM

## 2019-05-18 MED ORDER — FENOFIBRATE 160 MG PO TABS: 160.0000 mg | ORAL_TABLET | Freq: Every day | ORAL | 3 refills | Status: DC

## 2019-05-18 MED ORDER — PANTOPRAZOLE SODIUM 40 MG PO TBEC: DELAYED_RELEASE_TABLET | ORAL | 1 refills | Status: DC

## 2019-05-19 ENCOUNTER — Encounter: Payer: Self-pay | Admitting: Family Medicine

## 2019-05-19 DIAGNOSIS — Z1211 Encounter for screening for malignant neoplasm of colon: Secondary | ICD-10-CM

## 2019-05-24 ENCOUNTER — Encounter: Payer: Self-pay | Admitting: Internal Medicine

## 2019-05-29 ENCOUNTER — Encounter: Payer: Self-pay | Admitting: Family Medicine

## 2019-05-29 ENCOUNTER — Ambulatory Visit: Payer: BC Managed Care – PPO | Admitting: Family Medicine

## 2019-05-29 ENCOUNTER — Other Ambulatory Visit: Payer: Self-pay

## 2019-05-29 VITALS — BP 132/85 | HR 90 | Temp 97.9°F | Ht 68.75 in | Wt 168.0 lb

## 2019-05-29 DIAGNOSIS — J309 Allergic rhinitis, unspecified: Secondary | ICD-10-CM | POA: Diagnosis not present

## 2019-05-29 NOTE — Patient Instructions (Addendum)
Drink plenty of water. Start taking your zyrtec again. Use Mucinex (guaifenesin) as needed. Consider sinus rinses (Neti-pot or sinus rinse kit) as needed for thick secretions, sinus pressure.  Symptoms sound consistent with allergies, but with your daughter recently being sick, it is possible it could be viral.    Let us know if symptoms change--any fever, worsening cough, shortness of breath, discolored mucus, loss of smell/taste or other new symptoms develop.

## 2019-05-29 NOTE — Progress Notes (Signed)
Start time: 12:09 End time: 12:19   Virtual Visit via Video Note  I connected with Marshall Cork Lafosse on 05/29/19 by a video enabled telemedicine application and verified that I am speaking with the correct person using two identifiers.  Location: Patient: home Provider: office   I discussed the limitations of evaluation and management by telemedicine and the availability of in person appointments. The patient expressed understanding and agreed to proceed.  History of Present Illness:  Chief Complaint  Patient presents with  . Cough    started last night, drainage since Thursday-mucus in clear in color, no temps. Scratchy throat was only Th-fri and would help with drinking some water.    Last week had scratchy throat, drinking water helped.  He started coughing last night.  He has sinus congestion and postnasal drip.  Drainage is clear. No sinus pain. Some sneezing, sniffling.  No watery eyes.  No fevers. Seems more like cold than allergies. Daughter had some congestion last week (sniffles/sneezing only)  PMH, PSH, SH reviewed.   Outpatient Encounter Medications as of 05/29/2019  Medication Sig Note  . calcium carbonate (TUMS - DOSED IN MG ELEMENTAL CALCIUM) 500 MG chewable tablet Chew 2 tablets by mouth as needed for indigestion or heartburn. Reported on 11/26/2015 03/13/2019: Taking 2-3 times a week  . fenofibrate 160 MG tablet Take 1 tablet (160 mg total) by mouth daily.   . Multiple Vitamin (MULTIVITAMINS PO) Take by mouth daily.   04/15/2015: Takes daily   . OMEGA 3 1000 MG CAPS Take 1,000-2,000 mg by mouth daily.  03/13/2019: Takes 2 daily  . pantoprazole (PROTONIX) 40 MG tablet Take one pill once or twice daily as directed by physician   . ALPRAZolam (XANAX) 0.25 MG tablet Take 1-2 tablets (0.25-0.5 mg total) by mouth 3 (three) times daily as needed for anxiety or sleep. (Patient not taking: Reported on 05/18/2019)   . cetirizine (ZYRTEC) 10 MG tablet Take 10 mg by mouth daily.    Marland Kitchen ibuprofen (ADVIL,MOTRIN) 200 MG tablet Take 200-400 mg by mouth as needed. Reported on 11/26/2015   . simethicone (MYLICON) 0000000 MG chewable tablet Chew 125 mg by mouth every 6 (six) hours as needed. Reported on 11/26/2015 04/28/2017: Rarely needs  . [DISCONTINUED] ranitidine (RANITIDINE 75) 75 MG tablet Take 75 mg by mouth 2 (two) times daily.      No facility-administered encounter medications on file as of 05/29/2019.    No Known Allergies  ROS: no fever, chills. No chest pain, shortness of breath No fever Normal smell/taste Bowels are normal. No bleeding, bruising, rashes.   URI symptoms per HPI   Observations/Objective:  BP 132/85   Pulse 90   Temp 97.9 F (36.6 C) (Temporal)   Ht 5' 8.75" (1.746 m)   Wt 168 lb (76.2 kg)   BMI 24.99 kg/m   Well-appearing, pleasant male, in no distress Speaking easily, no sniffling, sneezing or coughing during visit.  Normal mood, affect, grooming, smiling when his toddler kept getting on camera. Exam limited due to virtual nature of the visit.   Assessment and Plan:  Allergic rhinitis, unspecified seasonality, unspecified trigger - vs very mild URI from daughter.  Supportive measures--restart Zyrtec, mucinex prn   Restart Zyrtec. Drink plenty of water. Use Mucinex (guaifenesin) as needed.   Follow Up Instructions:    I discussed the assessment and treatment plan with the patient. The patient was provided an opportunity to ask questions and all were answered. The patient agreed with the plan  and demonstrated an understanding of the instructions.   The patient was advised to call back or seek an in-person evaluation if the symptoms worsen or if the condition fails to improve as anticipated.  I provided 10 minutes of (video) face-to-face time during this encounter.   Vikki Ports, MD

## 2019-07-07 ENCOUNTER — Ambulatory Visit (INDEPENDENT_AMBULATORY_CARE_PROVIDER_SITE_OTHER): Payer: BC Managed Care – PPO | Admitting: Internal Medicine

## 2019-07-07 ENCOUNTER — Other Ambulatory Visit: Payer: Self-pay

## 2019-07-07 ENCOUNTER — Encounter: Payer: Self-pay | Admitting: Internal Medicine

## 2019-07-07 VITALS — BP 132/84 | HR 65 | Temp 98.2°F | Ht 68.75 in | Wt 173.6 lb

## 2019-07-07 DIAGNOSIS — Z1159 Encounter for screening for other viral diseases: Secondary | ICD-10-CM

## 2019-07-07 DIAGNOSIS — K219 Gastro-esophageal reflux disease without esophagitis: Secondary | ICD-10-CM | POA: Diagnosis not present

## 2019-07-07 DIAGNOSIS — Z1211 Encounter for screening for malignant neoplasm of colon: Secondary | ICD-10-CM | POA: Diagnosis not present

## 2019-07-07 MED ORDER — SUPREP BOWEL PREP KIT 17.5-3.13-1.6 GM/177ML PO SOLN: 1.0000 | ORAL | 0 refills | Status: DC

## 2019-07-07 NOTE — Progress Notes (Signed)
Patient ID: Jordan Stafford, male   DOB: Dec 19, 1970, 48 y.o.   MRN: [id number] HPI: Jordan Stafford is a 48 year old male with a past medical history of GERD, IBS with bowel frequency, anxiety who is seen in consult at the request of Dr. Tomi Bamberger to evaluate colon cancer screening and GERD. He is here alone today.  He reports that during a physical his stools were noted to be trace heme positive. He feels that due to the mild trauma of the digital rectal exam and also hemorrhoids that this may explain the heme positive stool. He will occasionally see scant blood with wiping after bowel movement and when present it is usually during the day when his IBS is bothering him and he has had multiple bowel movements per day. He does not see blood in the stool or melena. No abdominal pain. No change in bowel habit. He does have irritable bowel which for him can be frequency not necessarily diarrhea but multiple small stools 5-7 times per day. However on most days he is regular having a formed bowel movement in the morning. Worse with stress.  He does report a history of reflux over the last 15 years. He takes pantoprazole 40 mg once a day but during flares he will use it twice a day. He uses Tums as needed. There is no dysphagia or odynophagia. Stress and anxiousness also worsens reflux symptoms.  There is no family history of colon cancer or GI tract malignancies. He works as a Educational psychologist at [employer] He is married. He has 1 daughter. Never smoker. No alcohol use.  Past Medical History:  Diagnosis Date  . Acne   . Anxiety   . GERD (gastroesophageal reflux disease)   . Hx of cardiovascular stress test    ETT-Myoview (08/2013):  No ischemia, EF 67%; normal study.  . IBS (irritable bowel syndrome)   . Renal stone   . Seasonal allergies   . Supraventricular tachycardia    Dr Caryl Comes    Past Surgical History:  Procedure Laterality Date  . MOUTH SURGERY     x3  . TOENAIL EXCISION     ingrown nail     Outpatient Medications Prior to Visit  Medication Sig Dispense Refill  . ALPRAZolam (XANAX) 0.25 MG tablet Take 1-2 tablets (0.25-0.5 mg total) by mouth 3 (three) times daily as needed for anxiety or sleep. 15 tablet 0  . calcium carbonate (TUMS - DOSED IN MG ELEMENTAL CALCIUM) 500 MG chewable tablet Chew 2 tablets by mouth as needed for indigestion or heartburn. Reported on 11/26/2015    . cetirizine (ZYRTEC) 10 MG tablet Take 10 mg by mouth daily.    . fenofibrate 160 MG tablet Take 1 tablet (160 mg total) by mouth daily. 90 tablet 3  . ibuprofen (ADVIL,MOTRIN) 200 MG tablet Take 200-400 mg by mouth as needed. Reported on 11/26/2015    . Multiple Vitamin (MULTIVITAMINS PO) Take by mouth daily.      . OMEGA 3 1000 MG CAPS Take 1,000-2,000 mg by mouth daily.     . pantoprazole (PROTONIX) 40 MG tablet Take one pill once or twice daily as directed by physician 180 tablet 1  . simethicone (MYLICON) 0000000 MG chewable tablet Chew 125 mg by mouth every 6 (six) hours as needed. Reported on 11/26/2015     No facility-administered medications prior to visit.    No Known Allergies  Family History  Problem Relation Age of Onset  . [place] White syndrome Mother   .  Hypertension Mother   . COPD Mother        smoker  . Urolithiasis Mother   . Diabetes Father   . Hypertension Father   . Cancer Father 76       renal; bladder CA at 76--removed bladder, prostate and 1 kidney age 82)  . Kidney cancer Father   . Heart disease Maternal Grandmother        MI at 40  . Cancer Paternal Grandfather        lung  . Heart disease Paternal Grandmother        late 51's  . Coronary artery disease Neg Hx     Social History   Tobacco Use  . Smoking status: Never Smoker  . Smokeless tobacco: Never Used  Substance Use Topics  . Alcohol use: No  . Drug use: No    ROS: As per history of present illness, otherwise negative  BP 132/84 (BP Location: Left Arm, Patient Position: Sitting)   Pulse 65    Temp 98.2 F (36.8 C)   Ht 5' 8.75" (1.746 m)   Wt 173 lb 9.6 oz (78.7 kg)   SpO2 98%   BMI 25.82 kg/m  Constitutional: Well-developed and well-nourished. No distress. HEENT: Normocephalic and atraumatic. Conjunctivae are normal.  No scleral icterus. Neck: Neck supple. Trachea midline. Cardiovascular: Normal rate, regular rhythm and intact distal pulses. No M/R/G Pulmonary/chest: Effort normal and breath sounds normal. No wheezing, rales or rhonchi. Abdominal: Soft, nontender, nondistended. Bowel sounds active throughout. There are no masses palpable. No hepatosplenomegaly. Extremities: no clubbing, cyanosis, or edema Neurological: Alert and oriented to person place and time. Skin: Skin is warm and dry.  Psychiatric: Normal mood and affect. Behavior is normal.  RELEVANT LABS AND IMAGING: CBC    Component Value Date/Time   WBC 5.2 05/16/2019 0841   WBC 5.1 04/22/2017 0754   RBC 4.62 05/16/2019 0841   RBC 4.83 04/22/2017 0754   HGB 14.0 05/16/2019 0841   HCT 41.2 05/16/2019 0841   PLT 225 05/16/2019 0841   MCV 89 05/16/2019 0841   MCH 30.3 05/16/2019 0841   MCH 30.4 04/22/2017 0754   MCHC 34.0 05/16/2019 0841   MCHC 34.8 04/22/2017 0754   RDW 12.4 05/16/2019 0841   LYMPHSABS 1.7 05/16/2019 0841   MONOABS 0.6 04/10/2014 1602   EOSABS 0.1 05/16/2019 0841   BASOSABS 0.0 05/16/2019 0841    CMP     Component Value Date/Time   NA 141 05/16/2019 0841   K 4.8 05/16/2019 0841   CL 103 05/16/2019 0841   CO2 25 05/16/2019 0841   GLUCOSE 101 (H) 05/16/2019 0841   GLUCOSE 109 (H) 04/22/2017 0754   BUN 19 05/16/2019 0841   CREATININE 1.06 05/16/2019 0841   CREATININE 1.07 04/22/2017 0754   CALCIUM 9.9 05/16/2019 0841   PROT 6.9 05/16/2019 0841   ALBUMIN 4.9 05/16/2019 0841   AST 25 05/16/2019 0841   ALT 28 05/16/2019 0841   ALKPHOS 46 05/16/2019 0841   BILITOT 0.5 05/16/2019 0841   GFRNONAA 83 05/16/2019 0841   GFRAA 95 05/16/2019 0841    ASSESSMENT/PLAN: 48 year old  male with a past medical history of GERD, IBS with bowel frequency, anxiety who is seen in consult at the request of Dr. Tomi Bamberger to evaluate colon cancer screening and GERD  1. Colon cancer screening/trace heme positive stool --it does sound like he may have outlet bleeding such as internal hemorrhoid. Colonoscopy is recommended for screening which will also evaluate this issue.  We discussed the risk, benefits and alternatives and he is agreeable and wishes to proceed.  2. GERD --patient on longstanding PPI therapy which once a day therapy works at most time. When he does have worsening symptoms he will use it twice a day with recapture of control. He will occasionally use Tums for breakthrough. I recommended upper endoscopy for Barrett's screening given his longstanding reflux disease. --EGD for GERD and Barrett's screening      [id number], [place], [place] [address] [place],  [place] [address]

## 2019-07-07 NOTE — Patient Instructions (Signed)
You have been scheduled for an endoscopy and colonoscopy. Please follow the written instructions given to you at your visit today. Please pick up your prep supplies at the pharmacy within the next 1-3 days. If you use inhalers (even only as needed), please bring them with you on the day of your procedure. Your physician has requested that you go to www.startemmi.com and enter the access code given to you at your visit today. This web site gives a general overview about your procedure. However, you should still follow specific instructions given to you by our office regarding your preparation for the procedure.  Continue Pantoprazole once-twice daily.  Please purchase the following medications over the counter and take as directed: Gavison or Pepcid as needed for breakthrough heartburn.  If you are age 49 or older, your body mass index should be between 23-30. Your Body mass index is 25.82 kg/m. If this is out of the aforementioned range listed, please consider follow up with your Primary Care Provider.  If you are age 45 or younger, your body mass index should be between 19-25. Your Body mass index is 25.82 kg/m. If this is out of the aformentioned range listed, please consider follow up with your Primary Care Provider.

## 2019-08-14 ENCOUNTER — Encounter: Payer: Self-pay | Admitting: Family Medicine

## 2019-08-21 ENCOUNTER — Encounter: Payer: BC Managed Care – PPO | Admitting: Internal Medicine

## 2019-08-23 ENCOUNTER — Ambulatory Visit: Payer: BC Managed Care – PPO | Admitting: Family Medicine

## 2019-08-23 ENCOUNTER — Encounter: Payer: Self-pay | Admitting: Family Medicine

## 2019-08-23 ENCOUNTER — Other Ambulatory Visit: Payer: Self-pay

## 2019-08-23 VITALS — BP 139/91 | HR 101 | Ht 68.75 in | Wt 170.0 lb

## 2019-08-23 DIAGNOSIS — R197 Diarrhea, unspecified: Secondary | ICD-10-CM

## 2019-08-23 DIAGNOSIS — F419 Anxiety disorder, unspecified: Secondary | ICD-10-CM

## 2019-08-23 DIAGNOSIS — K219 Gastro-esophageal reflux disease without esophagitis: Secondary | ICD-10-CM | POA: Diagnosis not present

## 2019-08-23 MED ORDER — DEXILANT 60 MG PO CPDR: 60.0000 mg | DELAYED_RELEASE_CAPSULE | Freq: Every day | ORAL | 0 refills | Status: DC

## 2019-08-23 NOTE — Progress Notes (Signed)
Start time: 2:06 End time: 2:33  Virtual Visit via Video Note  I connected with Jordan Stafford on 08/23/19 at  2:30 PM EST by a video enabled telemedicine application and verified that I am speaking with the correct person using two identifiers.  Location: Patient: home, wife present Provider: office   I discussed the limitations of evaluation and management by telemedicine and the availability of in person appointments. The patient expressed understanding and agreed to proceed.  History of Present Illness: Chief Complaint  Patient presents with  . Gastroesophageal Reflux    VIRTUAL for GERD-certain times where it is really bad. Set up colonoscopy and endoscopy with GI. Using Gaviscon and protonix but wondering if protonix isn't working.     He saw Dr. Hilarie Fredrickson on 12/11. He is scheduled for EGD and colonoscopy next month.  He was told to take Gaviscon rather than Tums. Reflux got worse between Christmas and New Year's, despite using Protonix BID and Gaviscon.  Symptoms were usually happening 3-5 am, but during the holiday week it was all day long.  He reports that he had lots of red sauces that week, and prime rib, as the trigger for the flare.   Symptoms started getting better 1/2-1/3, back to only needing 1 protonix daily (with gaviscon at night if he woke up with symptoms), and was doing well until earlier this week.  Monday of this week he had another flare.  He had spaghetti for dinner on Sunday, then steak sandwich the next day, and an hour later he had pressure in his stomach. Felt like it "imploded", then improved.  Gaviscon helped some.  He has been taking Protonix BID since Sun/Mon.    Currently--no burning or GERD symptoms, but stomach "isn't happy".  Eating a small meal (bagel) helped, bland diet. Bowels are slightly loose, and more frequent than normal. Similar to IBS he has had in the past.  Changes in diet as note above.  Other changes that could influence his symptoms is  that he has been sleeping on recliner since before the holidays (related to dog being sick, up a lot at night, dog won't go upstairs).  Sometimes when on the recliner the head reclines back more than his bed does.  In bed, he can elevate the head of bed (sleep number bed), but hasn't slept there recently.  He is also concerned about his blood pressure and pulse.  He notes that they were both elevated today.  His stomach has been worrying him. Sleep hasn't been as good, related to dog's illness (up frequently at night, sleeping on recliner), father was hospitalized (dehydration, now home and a little better).   PMH, PSH, SH reviewed  Outpatient Encounter Medications as of 08/23/2019  Medication Sig Note  . Alum Hydroxide-Mag Carbonate (GAVISCON PO) Take 2 each by mouth as needed.   . fenofibrate 160 MG tablet Take 1 tablet (160 mg total) by mouth daily.   . Multiple Vitamin (MULTIVITAMINS PO) Take by mouth daily.   04/15/2015: Takes daily   . OMEGA 3 1000 MG CAPS Take 1,000-2,000 mg by mouth daily.  03/13/2019: Takes 2 daily  . pantoprazole (PROTONIX) 40 MG tablet Take one pill once or twice daily as directed by physician   . ALPRAZolam (XANAX) 0.25 MG tablet Take 1-2 tablets (0.25-0.5 mg total) by mouth 3 (three) times daily as needed for anxiety or sleep. (Patient not taking: Reported on 08/23/2019)   . cetirizine (ZYRTEC) 10 MG tablet Take 10 mg by  mouth daily.   Marland Kitchen ibuprofen (ADVIL,MOTRIN) 200 MG tablet Take 200-400 mg by mouth as needed. Reported on 11/26/2015   . simethicone (MYLICON) 573 MG chewable tablet Chew 125 mg by mouth every 6 (six) hours as needed. Reported on 11/26/2015 04/28/2017: Rarely needs  . SUPREP BOWEL PREP KIT 17.5-3.13-1.6 GM/177ML SOLN Take 1 kit by mouth as directed. For colonoscopy prep (Patient not taking: Reported on 08/23/2019)   . [DISCONTINUED] calcium carbonate (TUMS - DOSED IN MG ELEMENTAL CALCIUM) 500 MG chewable tablet Chew 2 tablets by mouth as needed for indigestion  or heartburn. Reported on 11/26/2015 03/13/2019: Taking 2-3 times a week  . [DISCONTINUED] ranitidine (RANITIDINE 75) 75 MG tablet Take 75 mg by mouth 2 (two) times daily.      No facility-administered encounter medications on file as of 08/23/2019.   No Known Allergies  ROS:  No fever, chills, dysphagia, melena or hematochezia.  GERD and loose stools per HPI.  +stress/anxiety.  No chest pain.  See HPI     Observations/Objective:  BP (!) 139/91   Pulse (!) 101   Ht 5' 8.75" (1.746 m)   Wt 170 lb (77.1 kg)   BMI 25.29 kg/m   Well-appearing male.  He appears somewhat anxious/worried, but in no distress He is alert, oriented, normal cranial nerves. Anxious mood, normal eye contact, speech, grooming Exam is limited due to virtual nature of the visit.   Assessment and Plan:  Gastroesophageal reflux disease, unspecified whether esophagitis present - reviewed proper diet. Elevated HOB while on couch. Change to Dexilant x 10d in place of protonix.  f/u as scheduled for EGD/colonoscopy next month - Plan: dexlansoprazole (DEXILANT) 60 MG capsule  Diarrhea, unspecified type - Ddx reviewed (to include IBS, viral syndrome); cont bland diet  Anxiety - reassured that elevated BP/pulse likely related to anxiety. Reminded of relaxation techniques previously discussed  Samples of Dexilant were put up front for him to pick up.   Follow Up Instructions:    I discussed the assessment and treatment plan with the patient. The patient was provided an opportunity to ask questions and all were answered. The patient agreed with the plan and demonstrated an understanding of the instructions.   The patient was advised to call back or seek an in-person evaluation if the symptoms worsen or if the condition fails to improve as anticipated.  I provided 27 minutes of video face-to-face time during this encounter. Additional 5 minutes of chart review and documentation.   Vikki Ports, MD

## 2019-08-24 ENCOUNTER — Encounter: Payer: Self-pay | Admitting: Family Medicine

## 2019-08-26 ENCOUNTER — Other Ambulatory Visit: Payer: Self-pay | Admitting: Family Medicine

## 2019-08-26 DIAGNOSIS — E781 Pure hyperglyceridemia: Secondary | ICD-10-CM

## 2019-08-28 ENCOUNTER — Encounter: Payer: Self-pay | Admitting: Family Medicine

## 2019-08-28 DIAGNOSIS — K219 Gastro-esophageal reflux disease without esophagitis: Secondary | ICD-10-CM

## 2019-08-28 MED ORDER — DEXILANT 60 MG PO CPDR: 60.0000 mg | DELAYED_RELEASE_CAPSULE | Freq: Every day | ORAL | 2 refills | Status: DC

## 2019-08-28 NOTE — Telephone Encounter (Signed)
changed

## 2019-08-28 NOTE — Telephone Encounter (Signed)
Is this okay? Looks like  You rx'd 160 but not covered-are you ok with 135?

## 2019-09-13 ENCOUNTER — Telehealth: Payer: Self-pay | Admitting: Internal Medicine

## 2019-09-13 ENCOUNTER — Encounter: Payer: Self-pay | Admitting: *Deleted

## 2019-09-13 NOTE — Telephone Encounter (Signed)
Pt had rescheduled colon to 2.25.21 10:30 AM and requested new instructions to be sent to [facility].

## 2019-09-13 NOTE — Telephone Encounter (Signed)
Instructions sent via mychart

## 2019-09-14 ENCOUNTER — Encounter: Payer: Self-pay | Admitting: Internal Medicine

## 2019-09-15 ENCOUNTER — Encounter: Payer: Self-pay | Admitting: Family Medicine

## 2019-09-15 NOTE — Telephone Encounter (Signed)
Linda Please see that Morton Amy or other pre-cert staff gets this information.  Patient has requested followup with them. Thanks [employer]

## 2019-09-18 NOTE — Telephone Encounter (Signed)
I spoke to Jordan Stafford this morning.  We went over all of the details on the benefits for his upcoming procedure.  I also went over some of his prep instructions, and gave encouragement because he was a little anxious about the procedure.  He thanked me for my time and information.

## 2019-09-19 ENCOUNTER — Other Ambulatory Visit: Payer: Self-pay | Admitting: Internal Medicine

## 2019-09-19 ENCOUNTER — Ambulatory Visit (INDEPENDENT_AMBULATORY_CARE_PROVIDER_SITE_OTHER): Payer: BC Managed Care – PPO

## 2019-09-19 DIAGNOSIS — Z1159 Encounter for screening for other viral diseases: Secondary | ICD-10-CM

## 2019-09-19 LAB — SARS CORONAVIRUS 2 (TAT 6-24 HRS): SARS Coronavirus 2: NEGATIVE

## 2019-09-21 ENCOUNTER — Encounter: Payer: Self-pay | Admitting: Internal Medicine

## 2019-09-21 ENCOUNTER — Other Ambulatory Visit: Payer: Self-pay

## 2019-09-21 ENCOUNTER — Ambulatory Visit (AMBULATORY_SURGERY_CENTER): Payer: BC Managed Care – PPO | Admitting: Internal Medicine

## 2019-09-21 VITALS — BP 127/85 | HR 82 | Temp 97.5°F | Resp 14 | Ht 68.0 in | Wt 170.0 lb

## 2019-09-21 DIAGNOSIS — K219 Gastro-esophageal reflux disease without esophagitis: Secondary | ICD-10-CM | POA: Diagnosis present

## 2019-09-21 DIAGNOSIS — Z1211 Encounter for screening for malignant neoplasm of colon: Secondary | ICD-10-CM | POA: Diagnosis not present

## 2019-09-21 DIAGNOSIS — D128 Benign neoplasm of rectum: Secondary | ICD-10-CM

## 2019-09-21 DIAGNOSIS — D123 Benign neoplasm of transverse colon: Secondary | ICD-10-CM

## 2019-09-21 MED ORDER — SODIUM CHLORIDE 0.9 % IV SOLN: 500.0000 mL | Freq: Once | INTRAVENOUS | Status: DC

## 2019-09-21 NOTE — Progress Notes (Signed)
Pt tolerated well. VSS. Awake and to recovery. 

## 2019-09-21 NOTE — Op Note (Signed)
[place] Patient Name: Jordan Stafford Procedure Date: 09/21/2019 11:22 AM MRN: [id number] Endoscopist: Jerene Bears , MD Age: 49 Referring MD:  Date of Birth: November 04, 1970 Gender: Male Account #: [id number] Procedure:                Colonoscopy Indications:              Screening for colorectal malignant neoplasm Medicines:                Monitored Anesthesia Care Procedure:                Pre-Anesthesia Assessment:                           - Prior to the procedure, a History and Physical                            was performed, and patient medications and                            allergies were reviewed. The patient's tolerance of                            previous anesthesia was also reviewed. The risks                            and benefits of the procedure and the sedation                            options and risks were discussed with the patient.                            All questions were answered, and informed consent                            was obtained. Prior Anticoagulants: The patient has                            taken no previous anticoagulant or antiplatelet                            agents. ASA Grade Assessment: II - A patient with                            mild systemic disease. After reviewing the risks                            and benefits, the patient was deemed in                            satisfactory condition to undergo the procedure.                           After obtaining informed consent, the colonoscope  was passed under direct vision. Throughout the                            procedure, the patient's blood pressure, pulse, and                            oxygen saturations were monitored continuously. The                            Colonoscope was introduced through the anus and                            advanced to the cecum, identified by appendiceal                            orifice and ileocecal  valve. The colonoscopy was                            performed without difficulty. The patient tolerated                            the procedure well. The quality of the bowel                            preparation was good. The ileocecal valve,                            appendiceal orifice, and rectum were photographed. Scope In: 11:28:41 AM Scope Out: 11:41:02 AM Scope Withdrawal Time: 0 hours 10 minutes 14 seconds  Total Procedure Duration: 0 hours 12 minutes 21 seconds  Findings:                 The digital rectal exam was normal.                           A 3 mm polyp was found in the transverse colon. The                            polyp was sessile. The polyp was removed with a                            cold snare. Resection and retrieval were complete.                           A 6 mm polyp was found in the rectum. The polyp was                            sessile. The polyp was removed with a cold snare.                            Resection and retrieval were complete.  Internal hemorrhoids were found during                            retroflexion. The hemorrhoids were small. Complications:            No immediate complications. Estimated Blood Loss:     Estimated blood loss was minimal. Impression:               - One 3 mm polyp in the transverse colon, removed                            with a cold snare. Resected and retrieved.                           - One 6 mm polyp in the rectum, removed with a cold                            snare. Resected and retrieved.                           - Small internal hemorrhoids. Recommendation:           - Patient has a contact number available for                            emergencies. The signs and symptoms of potential                            delayed complications were discussed with the                            patient. Return to normal activities tomorrow.                            Written discharge  instructions were provided to the                            patient.                           - Resume previous diet.                           - Continue present medications.                           - Await pathology results.                           - Repeat colonoscopy is recommended for                            surveillance. The colonoscopy date will be                            determined after pathology results from today's  exam become available for review. Jerene Bears, MD 09/21/2019 11:46:40 AM This report has been signed electronically.

## 2019-09-21 NOTE — Op Note (Signed)
[facility] Patient Name: Jordan Stafford Procedure Date: 09/21/2019 11:22 AM MRN: [id number] Endoscopist: Jerene Bears , MD Age: 49 Referring MD:  Date of Birth: [birth year] Gender: Male Account #: [id number] Procedure:                Upper GI endoscopy Indications:              Gastro-esophageal reflux disease Medicines:                Monitored Anesthesia Care Procedure:                Pre-Anesthesia Assessment:                           - Prior to the procedure, a History and Physical                            was performed, and patient medications and                            allergies were reviewed. The patient's tolerance of                            previous anesthesia was also reviewed. The risks                            and benefits of the procedure and the sedation                            options and risks were discussed with the patient.                            All questions were answered, and informed consent                            was obtained. Prior Anticoagulants: The patient has                            taken no previous anticoagulant or antiplatelet                            agents. ASA Grade Assessment: II - A patient with                            mild systemic disease. After reviewing the risks                            and benefits, the patient was deemed in                            satisfactory condition to undergo the procedure.                           After obtaining informed consent, the endoscope was  passed under direct vision. Throughout the                            procedure, the patient's blood pressure, pulse, and                            oxygen saturations were monitored continuously. The                            Endoscope was introduced through the mouth, and                            advanced to the second part of duodenum. The upper                            GI endoscopy was  accomplished without difficulty.                            The patient tolerated the procedure well. Scope In: Scope Out: Findings:                 The esophagus was normal.                           The stomach was normal.                           The examined duodenum was normal. Complications:            No immediate complications. Estimated Blood Loss:     Estimated blood loss: none. Impression:               - Normal esophagus.                           - Normal stomach.                           - Normal examined duodenum.                           - No specimens collected. Recommendation:           - Patient has a contact number available for                            emergencies. The signs and symptoms of potential                            delayed complications were discussed with the                            patient. Return to normal activities tomorrow.                            Written discharge instructions were provided to the  patient.                           - Resume previous diet.                           - Continue present medications. Jerene Bears, MD 09/21/2019 11:44:53 AM This report has been signed electronically.

## 2019-09-21 NOTE — Patient Instructions (Signed)
Handouts on polyps and hemorrhoids given to you today  Await pathology results    YOU HAD AN ENDOSCOPIC PROCEDURE TODAY AT [facility]:   Refer to the procedure report that was given to you for any specific questions about what was found during the examination.  If the procedure report does not answer your questions, please call your gastroenterologist to clarify.  If you requested that your care partner not be given the details of your procedure findings, then the procedure report has been included in a sealed envelope for you to review at your convenience later.  YOU SHOULD EXPECT: Some feelings of bloating in the abdomen. Passage of more gas than usual.  Walking can help get rid of the air that was put into your GI tract during the procedure and reduce the bloating. If you had a lower endoscopy (such as a colonoscopy or flexible sigmoidoscopy) you may notice spotting of blood in your stool or on the toilet paper. If you underwent a bowel prep for your procedure, you may not have a normal bowel movement for a few days.  Please Note:  You might notice some irritation and congestion in your nose or some drainage.  This is from the oxygen used during your procedure.  There is no need for concern and it should clear up in a day or so.  SYMPTOMS TO REPORT IMMEDIATELY:   Following lower endoscopy (colonoscopy or flexible sigmoidoscopy):  Excessive amounts of blood in the stool  Significant tenderness or worsening of abdominal pains  Swelling of the abdomen that is new, acute  Fever of 100F or higher   Following upper endoscopy (EGD)  Vomiting of blood or coffee ground material  New chest pain or pain under the shoulder blades  Painful or persistently difficult swallowing  New shortness of breath  Fever of 100F or higher  Black, tarry-looking stools  For urgent or emergent issues, a gastroenterologist can be reached at any hour by calling [phone].   DIET:  We do  recommend a small meal at first, but then you may proceed to your regular diet.  Drink plenty of fluids but you should avoid alcoholic beverages for 24 hours.  ACTIVITY:  You should plan to take it easy for the rest of today and you should NOT DRIVE or use heavy machinery until tomorrow (because of the sedation medicines used during the test).    FOLLOW UP: Our staff will call the number listed on your records 48-72 hours following your procedure to check on you and address any questions or concerns that you may have regarding the information given to you following your procedure. If we do not reach you, we will leave a message.  We will attempt to reach you two times.  During this call, we will ask if you have developed any symptoms of COVID 19. If you develop any symptoms (ie: fever, flu-like symptoms, shortness of breath, cough etc.) before then, please call [phone].  If you test positive for Covid 19 in the 2 weeks post procedure, please call and report this information to [place].    If any biopsies were taken you will be contacted by phone or by letter within the next 1-3 weeks.  Please call us at [phone] if you have not heard about the biopsies in 3 weeks.    SIGNATURES/CONFIDENTIALITY: You and/or your care partner have signed paperwork which will be entered into your electronic medical record.  These signatures attest to the  fact that that the information above on your After Visit Summary has been reviewed and is understood.  Full responsibility of the confidentiality of this discharge information lies with you and/or your care-partner.

## 2019-09-21 NOTE — Progress Notes (Signed)
Called to room to assist during endoscopic procedure.  Patient ID and intended procedure confirmed with present staff. Received instructions for my participation in the procedure from the performing physician.  

## 2019-09-21 NOTE — Progress Notes (Signed)
Temp LC Vitals DT 

## 2019-09-25 ENCOUNTER — Encounter: Payer: Self-pay | Admitting: Internal Medicine

## 2019-09-25 ENCOUNTER — Telehealth: Payer: Self-pay

## 2019-09-25 NOTE — Telephone Encounter (Signed)
  Follow up Call-  Call back number 09/21/2019  Post procedure Call Back phone  # [phone]  Permission to leave phone message Yes  Some recent data might be hidden     Patient questions:  Do you have a fever, pain , or abdominal swelling? No. Pain Score  0 *  Have you tolerated food without any problems? Yes.    Have you been able to return to your normal activities? Yes.    Do you have any questions about your discharge instructions: Diet   No. Medications  No. Follow up visit  No.  Do you have questions or concerns about your Care? No.  Actions: * If pain score is 4 or above: No action needed, pain <4. 1. Have you developed a fever since your procedure? no  2.   Have you had an respiratory symptoms (SOB or cough) since your procedure? no  3.   Have you tested positive for COVID 19 since your procedure no  4.   Have you had any family members/close contacts diagnosed with the COVID 19 since your procedure?  no   If yes to any of these questions please route to Joylene Malick, RN and Alphonsa Gin, Therapist, sports.

## 2019-11-20 ENCOUNTER — Encounter: Payer: Self-pay | Admitting: Family Medicine

## 2019-11-20 DIAGNOSIS — K219 Gastro-esophageal reflux disease without esophagitis: Secondary | ICD-10-CM

## 2019-11-20 MED ORDER — DEXILANT 60 MG PO CPDR: 60.0000 mg | DELAYED_RELEASE_CAPSULE | Freq: Every day | ORAL | 0 refills | Status: DC

## 2019-11-23 ENCOUNTER — Other Ambulatory Visit: Payer: Self-pay

## 2019-11-23 ENCOUNTER — Encounter: Payer: Self-pay | Admitting: Family Medicine

## 2019-11-23 ENCOUNTER — Ambulatory Visit: Payer: BC Managed Care – PPO | Admitting: Family Medicine

## 2019-11-23 VITALS — BP 130/86 | HR 100 | Temp 99.2°F | Ht 68.75 in | Wt 171.0 lb

## 2019-11-23 DIAGNOSIS — K219 Gastro-esophageal reflux disease without esophagitis: Secondary | ICD-10-CM

## 2019-11-23 DIAGNOSIS — R0789 Other chest pain: Secondary | ICD-10-CM | POA: Diagnosis not present

## 2019-11-23 DIAGNOSIS — F419 Anxiety disorder, unspecified: Secondary | ICD-10-CM | POA: Diagnosis not present

## 2019-11-23 DIAGNOSIS — R079 Chest pain, unspecified: Secondary | ICD-10-CM

## 2019-11-23 NOTE — Progress Notes (Signed)
Chief Complaint  Patient presents with  . Chest Pain    3-4 days, intermittent. Across left side of chest, upper left arm. Feels like a bubble at times, something like gas. Sort of like a pulsating, not sharp pain. He is unsure if this is cardiac or GERD related, this is why he came in.     Patient presents with complaint of L sided chest pain x 3-4 days, which sometimes radiates into L upper arm. This has occurred at rest and with activity, with no particular pattern. Seems to be worse earlier in the day, less by dinnertime and afterwards.  +stress at work--a lot of people were let go, but he is safe "for this year".  Other changes also being made at work. He has been doing yardwork, didn't have pain with activity, but needed to take a rest due to fatigue, felt a little unbalanced.  No dyspnea on exertion.  +gas--belching and passing gas from below.  Taking Dexilant daily, which is controlling his "burning sensation" that he still had while on BID protonix.  PMH, PSH, SH reviewed  Outpatient Encounter Medications as of 11/23/2019  Medication Sig Note  . cetirizine (ZYRTEC) 10 MG tablet Take 10 mg by mouth daily.   . Choline Fenofibrate (FENOFIBRIC ACID) 135 MG CPDR Take 135 mg by mouth daily.   Marland Kitchen dexlansoprazole (DEXILANT) 60 MG capsule Take 1 capsule (60 mg total) by mouth daily.   . famotidine (PEPCID) 10 MG tablet Take 20 mg by mouth at bedtime.   . Multiple Vitamin (MULTIVITAMINS PO) Take by mouth daily.   04/15/2015: Takes daily   . OMEGA 3 1000 MG CAPS Take 1,000-2,000 mg by mouth daily.  03/13/2019: Takes 2 daily  . ALPRAZolam (XANAX) 0.25 MG tablet Take 1-2 tablets (0.25-0.5 mg total) by mouth 3 (three) times daily as needed for anxiety or sleep. (Patient not taking: Reported on 08/23/2019)   . Alum Hydroxide-Mag Carbonate (GAVISCON PO) Take 2 each by mouth as needed.   Marland Kitchen ibuprofen (ADVIL,MOTRIN) 200 MG tablet Take 200-400 mg by mouth as needed. Reported on 11/26/2015   . simethicone  (MYLICON) 0000000 MG chewable tablet Chew 125 mg by mouth every 6 (six) hours as needed. Reported on 11/26/2015   . [DISCONTINUED] fenofibrate 160 MG tablet Take 1 tablet (160 mg total) by mouth daily.   . [DISCONTINUED] ranitidine (RANITIDINE 75) 75 MG tablet Take 75 mg by mouth 2 (two) times daily.      No facility-administered encounter medications on file as of 11/23/2019.   No Known Allergies  ROS: no fever, chills, headaches, dizziness, shortness of breath, nausea, vomiting.  +reflux/burning, improved.  +chest pain per HPI.  No bleeding, bruising, rash or other complaints. +work stress   PHYSICAL EXAM:  BP (!) 140/98   Pulse 100   Temp 99.2 F (37.3 C) (Tympanic)   Ht 5' 8.75" (1.746 m)   Wt 171 lb (77.6 kg)   BMI 25.44 kg/m   130/86 on repeat by MD  Pleasant, somewhat anxious male, in no acute results HEENT: conjunctiva and sclera are clear, EOMI, wearing mask Neck: no lymphadenopathy, thyromegaly or mass Heart: regular rate and rhythm, no murmur Chest wall: Tender to palpation over a couple of L costochondral junctions (mid-chest). Tender along the L 2nd and 3rd ribs, and intercostal muscles. There was also tenderness along the L pectoralis muscles, extending to the L shoulder and some tenderness even in the axilla. No adenopathy noted. Skin is clear, no rashes Lungs: clear  bilaterally Abdomen: mild epigastric tenderness; no rebound, guarding or mass Extremities: no edema  EKG: NSR, rate 88. Poss LAE. No e/o ischemia.   ASSESSMENT/PLAN:  Left-sided chest pain - normal EKG, patient reassurred non-cardiac etiology - Plan: EKG 12-Lead  Chest wall pain - musculoskeletal--some muscle tenderness and costochondral tenderness.  ibuprofen and moist heat recommended, as well as stretches - Plan: EKG 12-Lead  Anxiety - +work stress, and anxious regarding chest discomfort.  Contributing to elevated BP, which was improved on recheck.  Gastroesophageal reflux disease, unspecified  whether esophagitis present - Continue Dexilant daily (recently refilled)

## 2019-11-23 NOTE — Patient Instructions (Signed)
You definitely have a muscular component to your left chest wall pain. Possibly from yardwork or other activity. There may also be some inflammation where the ribs meet up with the sternum.  I recommend using warm compresses. Do some gentle stretches of those muscles. You can use tylenol if needed for pain. If not resolving, you can use anti-inflammatories (with hopes this doesn't flare up your stomach).

## 2020-01-19 LAB — HM MAMMOGRAPHY

## 2020-01-22 ENCOUNTER — Encounter: Payer: Self-pay | Admitting: *Deleted

## 2020-02-21 ENCOUNTER — Other Ambulatory Visit: Payer: Self-pay | Admitting: Family Medicine

## 2020-02-21 DIAGNOSIS — K219 Gastro-esophageal reflux disease without esophagitis: Secondary | ICD-10-CM

## 2020-02-21 MED ORDER — DEXILANT 60 MG PO CPDR: 60.0000 mg | DELAYED_RELEASE_CAPSULE | Freq: Every day | ORAL | 0 refills | Status: DC

## 2020-04-19 ENCOUNTER — Encounter: Payer: Self-pay | Admitting: Family Medicine

## 2020-05-09 ENCOUNTER — Other Ambulatory Visit (INDEPENDENT_AMBULATORY_CARE_PROVIDER_SITE_OTHER): Payer: BC Managed Care – PPO

## 2020-05-09 ENCOUNTER — Other Ambulatory Visit: Payer: Self-pay

## 2020-05-09 DIAGNOSIS — Z23 Encounter for immunization: Secondary | ICD-10-CM | POA: Diagnosis not present

## 2020-05-16 ENCOUNTER — Other Ambulatory Visit: Payer: Self-pay | Admitting: Family Medicine

## 2020-05-16 DIAGNOSIS — K219 Gastro-esophageal reflux disease without esophagitis: Secondary | ICD-10-CM

## 2020-05-20 ENCOUNTER — Other Ambulatory Visit: Payer: BC Managed Care – PPO

## 2020-05-21 ENCOUNTER — Other Ambulatory Visit: Payer: Self-pay

## 2020-05-21 ENCOUNTER — Other Ambulatory Visit: Payer: BC Managed Care – PPO

## 2020-05-21 DIAGNOSIS — Z125 Encounter for screening for malignant neoplasm of prostate: Secondary | ICD-10-CM

## 2020-05-21 DIAGNOSIS — Z5181 Encounter for therapeutic drug level monitoring: Secondary | ICD-10-CM

## 2020-05-21 DIAGNOSIS — E781 Pure hyperglyceridemia: Secondary | ICD-10-CM

## 2020-05-21 DIAGNOSIS — R7301 Impaired fasting glucose: Secondary | ICD-10-CM

## 2020-05-21 DIAGNOSIS — Z0000 Encounter for general adult medical examination without abnormal findings: Secondary | ICD-10-CM

## 2020-05-21 LAB — LIPID PANEL

## 2020-05-22 LAB — CBC WITH DIFFERENTIAL/PLATELET
Basophils Absolute: 0 10*3/uL (ref 0.0–0.2)
Basos: 0 %
EOS (ABSOLUTE): 0.1 10*3/uL (ref 0.0–0.4)
Eos: 2 %
Hematocrit: 41.2 % (ref 37.5–51.0)
Hemoglobin: 14 g/dL (ref 13.0–17.7)
Immature Grans (Abs): 0 10*3/uL (ref 0.0–0.1)
Immature Granulocytes: 0 %
Lymphocytes Absolute: 1.3 10*3/uL (ref 0.7–3.1)
Lymphs: 27 %
MCH: 31 pg (ref 26.6–33.0)
MCHC: 34 g/dL (ref 31.5–35.7)
MCV: 91 fL (ref 79–97)
Monocytes Absolute: 0.4 10*3/uL (ref 0.1–0.9)
Monocytes: 9 %
Neutrophils Absolute: 3 10*3/uL (ref 1.4–7.0)
Neutrophils: 62 %
Platelets: 208 10*3/uL (ref 150–450)
RBC: 4.52 x10E6/uL (ref 4.14–5.80)
RDW: 12.4 % (ref 11.6–15.4)
WBC: 4.8 10*3/uL (ref 3.4–10.8)

## 2020-05-22 LAB — LIPID PANEL
Chol/HDL Ratio: 3.5 ratio (ref 0.0–5.0)
Cholesterol, Total: 156 mg/dL (ref 100–199)
HDL: 44 mg/dL (ref >39)
LDL Chol Calc (NIH): 95 mg/dL (ref 0–99)
Triglycerides: 89 mg/dL (ref 0–149)
VLDL Cholesterol Cal: 17 mg/dL (ref 5–40)

## 2020-05-22 LAB — COMPREHENSIVE METABOLIC PANEL
ALT: 27 IU/L (ref 0–44)
AST: 23 IU/L (ref 0–40)
Albumin/Globulin Ratio: 2.2 (ref 1.2–2.2)
Albumin: 5.1 g/dL — ABNORMAL HIGH (ref 4.0–5.0)
Alkaline Phosphatase: 47 IU/L (ref 44–121)
BUN/Creatinine Ratio: 19 (ref 9–20)
BUN: 19 mg/dL (ref 6–24)
Bilirubin Total: 0.5 mg/dL (ref 0.0–1.2)
CO2: 24 mmol/L (ref 20–29)
Calcium: 10 mg/dL (ref 8.7–10.2)
Chloride: 101 mmol/L (ref 96–106)
Creatinine, Ser: 0.99 mg/dL (ref 0.76–1.27)
GFR calc Af Amer: 103 mL/min/{1.73_m2} (ref >59)
GFR calc non Af Amer: 89 mL/min/{1.73_m2} (ref >59)
Globulin, Total: 2.3 g/dL (ref 1.5–4.5)
Glucose: 108 mg/dL — ABNORMAL HIGH (ref 65–99)
Potassium: 4.6 mmol/L (ref 3.5–5.2)
Sodium: 139 mmol/L (ref 134–144)
Total Protein: 7.4 g/dL (ref 6.0–8.5)

## 2020-05-22 LAB — PSA: Prostate Specific Ag, Serum: 0.5 ng/mL (ref 0.0–4.0)

## 2020-05-22 NOTE — Patient Instructions (Signed)

## 2020-05-22 NOTE — Progress Notes (Signed)
Chief Complaint  Patient presents with  . Annual Exam    nonfasting annual exam. Has been having issues with tonsil stones. Saw dentist and was told what they were.    Jordan Stafford is a 49 y.o. male who presents for a complete physical.  He had labs done prior to his visit, see below. He has the following concerns:  He could feel something on his tonsils when drinking water, was able to dislodge with his tongue.  He went to the dentist (who removed it), and told him what it was. This only occurs on the left, as he reports having seen more craters/holes on that side. He currently denies any discomfort.  He was seen in April with left-sided chest pain, that was musculoskeletal in nature (reproducible tenderness, EKG normal). He reports this resolved.   Elevated BP's, in office related to anxiety.  Checks at home periodically, when feeling good his BP is 116/70-80, and if not feeling well, 140/90. He has some discomfort at L chest that comes/goes, not related to anything, he thinks possibly related to his reflux.  Sometimes relieved by Gas-X.  Hypertriglyceridemia: He is taking fenofibrate regularly without side effects (upsets his stomach if taken without food). He triesto follow a lowfat, low cholesterol diet, though admits some more candy ([employer]) recently. He had labs done prior to today's visit, see below.  Impaired fasting glucose: He tries to limit his sugar and sweets.  He unfortunately has been eating more [employer] lately.  A1c's have been normal. Lab Results  Component Value Date   HGBA1C 5.3 10/21/2017   GERD: In 07/2019 his reflux flared, poorly controlled on protonix, so was changed to Dexilant 60mg daily.  He is still taking this every day. 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 He has been taking 10mg pepcid before bedtime, and without that he would wake up choking. Flaring worse in the last few days or so, and has been taking 20mg Pepcid (per Dr. Pyrtle's  recommendation). Denies dysphagia,wheezing.  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 Had normal stress test 08/2013. He has had no further episodes of SVT since 2008. Released from care ofcardiologist in7/2015.   Immunization History  Administered Date(s) Administered  . Influenza Split 05/27/2011  . Influenza,inj,Quad PF,6+ Mos 04/11/2014, 04/15/2015, 04/15/2016, 04/12/2017, 03/29/2018, 04/10/2019, 05/09/2020  . PFIZER SARS-COV-2 Vaccination 08/03/2019, 08/24/2019, 04/25/2020  . Tdap 07/25/2008, 04/14/2018  . Varicella 05/18/2017, 06/22/2017   Last colonoscopy: 08/2019 with Dr. Pyrtle.  2 polyps (tubular adenomas), small internal hemorrhoids.  F/u 7 years recommended  Last PSA: with recent labs: Lab Results  Component Value Date   PSA1 0.5 05/21/2020  Dentist: twice yearly  Ophtho: once yearly  Exercise:"not much"  Walks briskly at work, but <10 minutes. +weight-bearing lifting daughter.  Yardwork, mowing yard.  PMH, PSH, SH and FH were reviewed and updated  Outpatient Encounter Medications as of 05/23/2020  Medication Sig Note  . cetirizine (ZYRTEC) 10 MG tablet Take 10 mg by mouth daily.   . Choline Fenofibrate (FENOFIBRIC ACID) 135 MG CPDR Take 135 mg by mouth daily.   . DEXILANT 60 MG capsule TAKE 1 CAPSULE BY MOUTH EVERY DAY   . famotidine (PEPCID) 10 MG tablet Take 20 mg by mouth at bedtime. 05/23/2020: Takes 10-20mg at bedtime (20 only if flaring)  . Multiple Vitamin (MULTIVITAMINS PO) Take by mouth daily.   04/15/2015: Takes daily   . OMEGA 3 1000   MG CAPS Take 1,000-2,000 mg by mouth daily.  03/13/2019: Takes 2 daily  . ALPRAZolam (XANAX) 0.25 MG tablet Take 1-2 tablets (0.25-0.5 mg total) by mouth 3 (three) times daily as needed for anxiety or sleep. (Patient not taking: Reported on 08/23/2019)   . simethicone (MYLICON) 125 MG chewable tablet Chew 125 mg by mouth every 6 (six) hours as needed. Reported on 11/26/2015 (Patient not  taking: Reported on 05/23/2020)   . [DISCONTINUED] Alum Hydroxide-Mag Carbonate (GAVISCON PO) Take 2 each by mouth as needed.   . [DISCONTINUED] fenofibrate 160 MG tablet Take 1 tablet (160 mg total) by mouth daily.   . [DISCONTINUED] ibuprofen (ADVIL,MOTRIN) 200 MG tablet Take 200-400 mg by mouth as needed. Reported on 11/26/2015 (Patient not taking: Reported on 05/23/2020)   . [DISCONTINUED] ranitidine (RANITIDINE 75) 75 MG tablet Take 75 mg by mouth 2 (two) times daily.      No facility-administered encounter medications on file as of 05/23/2020.   No Known Allergies  ROS: The patient denies anorexia, fever, weight changes, headaches, vision loss, decreased hearing, ear pain, hoarseness, chest pain (see HPI, none currently), dizziness, syncope, dyspnea on exertion, cough, swelling, nausea, vomiting, constipation, abdominal pain, melena, hematochezia, hematuria, incontinence, erectile dysfunction, nocturia, weakened urine stream, dysuria, genital lesions, joint pains, numbness, tingling, weakness, tremor, depression, abnormal bleeding/bruising, or enlarged lymph nodes  No recurrences of SVT Heartburn, depending on diet. Doing well overall on current regimen of PPI and H2 blocker per Dr. Pyrtle. Occasional thumb pain if daughter pulls at it. No pain with daily activities, no morning stiffness. Tonsillar stones intermittently per HPI.   PHYSICAL EXAM:  BP (!) 142/88   Pulse 80   Ht 5' 9" (1.753 m)   Wt 175 lb (79.4 kg)   BMI 25.84 kg/m   134/84 on repeat by MD  Wt Readings from Last 3 Encounters:  05/23/20 175 lb (79.4 kg)  11/23/19 171 lb (77.6 kg)  09/21/19 170 lb (77.1 kg)    General Appearance:  Alert, cooperative, no distress, appears stated age, in good spirits  Head:  Normocephalic, without obvious abnormality, atraumatic   Eyes:  PERRL, conjunctiva/corneas clear, EOM's intact, fundi benign   Ears:  Normal TM's and external ear canals   Nose:  Not examined, wearing  mask due to COVID-19 pandemic  Throat:  OP is clear. Tonsils are small, no e/o stones present today  Neck:  Supple, no lymphadenopathy; thyroid: no enlargement/tenderness/ nodules; no carotid bruit or JVD   Back:  Spine nontender, no curvature, ROM normal, no CVA tenderness   Lungs:  Clear to auscultation bilaterally without wheezes, rales or ronchi; respirations unlabored   Chest Wall:  No tenderness or deformity   Heart:  Regular rate and rhythm, S1 and S2 normal, no murmur, rub or gallop   Breast Exam:  No chest wall tenderness, masses or gynecomastia   Abdomen:  Soft, non-tender, nondistended, normoactive bowel sounds, no masses, no hepatosplenomegaly   Genitalia:  Normal male external genitalia without lesions. Testicles without masses. No inguinal hernias.   Rectal:  Normal sphincter tone, no masses or tenderness; heme negative stool. Prostate smooth, no nodules, not enlarged.   Extremities:  No clubbing, cyanosis or edema  Pulses:  2+ and symmetric all extremities   Skin:  Skin color, texture, turgor normal.  Lymph nodes:  Cervical, supraclavicular, and inguinal nodes normal   Neurologic:  CNII-XII intact, normal strength, sensation and gait; reflexes 2+ and symmetric throughout      Psych: Normal   mood, hygiene and grooming. Full range of affect   Lab Results  Component Value Date   CHOL 156 05/21/2020   HDL 44 05/21/2020   LDLCALC 95 05/21/2020   TRIG 89 05/21/2020   CHOLHDL 3.5 05/21/2020     Chemistry      Component Value Date/Time   NA 139 05/21/2020 0830   K 4.6 05/21/2020 0830   CL 101 05/21/2020 0830   CO2 24 05/21/2020 0830   BUN 19 05/21/2020 0830   CREATININE 0.99 05/21/2020 0830   CREATININE 1.07 04/22/2017 0754      Component Value Date/Time   CALCIUM 10.0 05/21/2020 0830   ALKPHOS 47 05/21/2020 0830   AST 23 05/21/2020 0830   ALT 27 05/21/2020 0830   BILITOT 0.5 05/21/2020 0830     Fasting glucose 108  Lab  Results  Component Value Date   WBC 4.8 05/21/2020   HGB 14.0 05/21/2020   HCT 41.2 05/21/2020   MCV 91 05/21/2020   PLT 208 05/21/2020   Lab Results  Component Value Date   PSA1 0.5 05/21/2020   Lab Results  Component Value Date   HGBA1C 5.2 05/23/2020    ASSESSMENT/PLAN:  Routine general medical examination at a health care facility - Plan: POCT Urinalysis DIP (Proadvantage Device)  Gastroesophageal reflux disease, unspecified whether esophagitis present - controlled on current regimen of Dexilant and pepcid  Impaired fasting glucose - counseled re: proper diet and exercise - Plan: HgB A1c  Hypertriglyceridemia - cont fenofibrate. Counseled on diet - Plan: Choline Fenofibrate (FENOFIBRIC ACID) 135 MG CPDR  Medication monitoring encounter  Screening for prostate cancer  Tonsillith - history.  Discussed hydration, gargles. Has a tool from dentist to use prn. Reassured   Recommended at least 30 minutes of aerobic activity at least 5 days/week, weight-bearing exercise 2-3x/wk; proper sunscreen use reviewed; healthy diet and alcohol recommendations (less than or equal to 2 drinks/day) reviewed; regular seatbelt use; changing batteries in smoke detectors. Immunization recommendations discussed--UTD. Continue yearly flu shots. Likely won't need Shingrix right at 49, since he only had varicella vaccines in 2018--advised no rush, can hold off; likelihood of getting shingles is not zero, but is lower than population who has had varicella infection.  Colonoscopy recommendations reviewed--UTD, 7 year f/u recommended.  F/u 1 year with fasting labs prior, sooner prn. c-met, CBC, lipid, PSA   

## 2020-05-23 ENCOUNTER — Encounter: Payer: Self-pay | Admitting: Family Medicine

## 2020-05-23 ENCOUNTER — Other Ambulatory Visit: Payer: Self-pay

## 2020-05-23 ENCOUNTER — Ambulatory Visit (INDEPENDENT_AMBULATORY_CARE_PROVIDER_SITE_OTHER): Payer: BC Managed Care – PPO | Admitting: Family Medicine

## 2020-05-23 VITALS — BP 142/88 | HR 80 | Ht 69.0 in | Wt 175.0 lb

## 2020-05-23 DIAGNOSIS — Z125 Encounter for screening for malignant neoplasm of prostate: Secondary | ICD-10-CM

## 2020-05-23 DIAGNOSIS — K219 Gastro-esophageal reflux disease without esophagitis: Secondary | ICD-10-CM | POA: Diagnosis not present

## 2020-05-23 DIAGNOSIS — R7301 Impaired fasting glucose: Secondary | ICD-10-CM | POA: Diagnosis not present

## 2020-05-23 DIAGNOSIS — J358 Other chronic diseases of tonsils and adenoids: Secondary | ICD-10-CM

## 2020-05-23 DIAGNOSIS — Z5181 Encounter for therapeutic drug level monitoring: Secondary | ICD-10-CM

## 2020-05-23 DIAGNOSIS — Z0000 Encounter for general adult medical examination without abnormal findings: Secondary | ICD-10-CM

## 2020-05-23 DIAGNOSIS — E781 Pure hyperglyceridemia: Secondary | ICD-10-CM | POA: Diagnosis not present

## 2020-05-23 LAB — POCT URINALYSIS DIP (PROADVANTAGE DEVICE)
Bilirubin, UA: NEGATIVE
Blood, UA: NEGATIVE
Glucose, UA: NEGATIVE mg/dL
Ketones, POC UA: NEGATIVE mg/dL
Leukocytes, UA: NEGATIVE
Nitrite, UA: NEGATIVE
Protein Ur, POC: NEGATIVE mg/dL
Specific Gravity, Urine: 1.01
Urobilinogen, Ur: NEGATIVE
pH, UA: 7 (ref 5.0–8.0)

## 2020-05-23 LAB — POCT GLYCOSYLATED HEMOGLOBIN (HGB A1C): Hemoglobin A1C: 5.2 % (ref 4.0–5.6)

## 2020-05-23 MED ORDER — FENOFIBRIC ACID 135 MG PO CPDR: 135.0000 mg | DELAYED_RELEASE_CAPSULE | Freq: Every day | ORAL | 3 refills | Status: DC

## 2020-08-09 ENCOUNTER — Other Ambulatory Visit: Payer: Self-pay | Admitting: Family Medicine

## 2020-08-09 DIAGNOSIS — K219 Gastro-esophageal reflux disease without esophagitis: Secondary | ICD-10-CM

## 2020-11-01 ENCOUNTER — Other Ambulatory Visit: Payer: Self-pay | Admitting: Family Medicine

## 2020-11-01 DIAGNOSIS — K219 Gastro-esophageal reflux disease without esophagitis: Secondary | ICD-10-CM

## 2020-12-02 ENCOUNTER — Encounter: Payer: Self-pay | Admitting: Family Medicine

## 2020-12-09 ENCOUNTER — Encounter: Payer: Self-pay | Admitting: Family Medicine

## 2020-12-20 ENCOUNTER — Encounter: Payer: Self-pay | Admitting: Family Medicine

## 2021-01-09 ENCOUNTER — Encounter: Payer: Self-pay | Admitting: Family Medicine

## 2021-01-10 ENCOUNTER — Ambulatory Visit: Payer: Self-pay | Attending: Critical Care Medicine

## 2021-01-10 DIAGNOSIS — Z20822 Contact with and (suspected) exposure to covid-19: Secondary | ICD-10-CM

## 2021-01-11 LAB — NOVEL CORONAVIRUS, NAA: SARS-CoV-2, NAA: NOT DETECTED

## 2021-01-11 LAB — SARS-COV-2, NAA 2 DAY TAT

## 2021-01-29 ENCOUNTER — Other Ambulatory Visit: Payer: Self-pay | Admitting: Family Medicine

## 2021-01-29 DIAGNOSIS — K219 Gastro-esophageal reflux disease without esophagitis: Secondary | ICD-10-CM

## 2021-01-29 NOTE — Telephone Encounter (Signed)
Advise pt that dexilant isn't covered, insurance asking to change to Protonix.  Not sure if he wants me to change this prescription, or if he wants to ask his GI (Dr. Hilarie Fredrickson) for recommendations regarding this (he did his endoscopy 08/2019)

## 2021-01-29 NOTE — Telephone Encounter (Signed)
Pt dexilant will not be covered. Please advise if med can be changed to protonix. Please advise [facility]

## 2021-01-30 ENCOUNTER — Other Ambulatory Visit: Payer: Self-pay | Admitting: Family Medicine

## 2021-01-30 ENCOUNTER — Encounter: Payer: Self-pay | Admitting: Family Medicine

## 2021-01-30 DIAGNOSIS — K219 Gastro-esophageal reflux disease without esophagitis: Secondary | ICD-10-CM

## 2021-02-12 ENCOUNTER — Telehealth: Payer: Self-pay | Admitting: Internal Medicine

## 2021-02-12 ENCOUNTER — Encounter: Payer: Self-pay | Admitting: *Deleted

## 2021-02-12 NOTE — Telephone Encounter (Signed)
Patient called said he was previously taking Dexilant for his Jordan Stafford but his insurance changed now so he is looking for an alternative.

## 2021-02-12 NOTE — Telephone Encounter (Signed)
I have spoken to patient to advise that he should contact his insurance company to find out which proton pump inhibitors they prefer since Dexilant is apparently no longer on their formulary. I have given the patient a list of PPI's to ask about and he indicates that he will call back with insurance preference at which time we can make a more informed decision regarding which medication to send in place of Dexilant to the pharmacy.

## 2021-02-17 MED ORDER — LANSOPRAZOLE 30 MG PO CPDR: 30.0000 mg | DELAYED_RELEASE_CAPSULE | Freq: Every day | ORAL | 3 refills | Status: DC

## 2021-02-17 NOTE — Telephone Encounter (Signed)
Patient has previously tried and failed Nexium (esomeprazole), pantoprazole and H2 antagonist, ranitidine for his GERD. His insurance will no longer cover [place] for him. They prefer lansoprazole, omeprazole, esomeprazole and pantoprazole. With his previous failure of esomeprazole and pantoprazole, we will try for lansoprazole for now and he is to call back with any worsening or symptomatic reflux symptoms.

## 2021-05-12 ENCOUNTER — Encounter: Payer: Self-pay | Admitting: Family Medicine

## 2021-05-15 ENCOUNTER — Other Ambulatory Visit: Payer: Self-pay | Admitting: Family Medicine

## 2021-05-15 ENCOUNTER — Other Ambulatory Visit (INDEPENDENT_AMBULATORY_CARE_PROVIDER_SITE_OTHER): Payer: BC Managed Care – PPO

## 2021-05-15 ENCOUNTER — Other Ambulatory Visit: Payer: Self-pay | Admitting: Internal Medicine

## 2021-05-15 ENCOUNTER — Other Ambulatory Visit: Payer: Self-pay

## 2021-05-15 DIAGNOSIS — Z23 Encounter for immunization: Secondary | ICD-10-CM | POA: Diagnosis not present

## 2021-05-15 DIAGNOSIS — E781 Pure hyperglyceridemia: Secondary | ICD-10-CM

## 2021-05-16 ENCOUNTER — Telehealth: Payer: Self-pay | Admitting: Internal Medicine

## 2021-05-16 ENCOUNTER — Encounter: Payer: Self-pay | Admitting: Family Medicine

## 2021-05-16 MED ORDER — LANSOPRAZOLE 30 MG PO CPDR: 30.0000 mg | DELAYED_RELEASE_CAPSULE | Freq: Every day | ORAL | 0 refills | Status: DC

## 2021-05-16 NOTE — Telephone Encounter (Signed)
Inbound call from patient requesting medication refill for lansoprazole. Have appt scheduled for 11/10

## 2021-05-16 NOTE — Telephone Encounter (Signed)
Rx sent 

## 2021-05-19 ENCOUNTER — Other Ambulatory Visit: Payer: Self-pay

## 2021-05-19 MED ORDER — LANSOPRAZOLE 30 MG PO CPDR: 30.0000 mg | DELAYED_RELEASE_CAPSULE | Freq: Every day | ORAL | 0 refills | Status: DC

## 2021-05-23 ENCOUNTER — Other Ambulatory Visit: Payer: BC Managed Care – PPO

## 2021-05-23 ENCOUNTER — Other Ambulatory Visit: Payer: Self-pay

## 2021-05-23 DIAGNOSIS — E781 Pure hyperglyceridemia: Secondary | ICD-10-CM

## 2021-05-23 DIAGNOSIS — Z5181 Encounter for therapeutic drug level monitoring: Secondary | ICD-10-CM

## 2021-05-23 DIAGNOSIS — Z125 Encounter for screening for malignant neoplasm of prostate: Secondary | ICD-10-CM

## 2021-05-23 DIAGNOSIS — Z0000 Encounter for general adult medical examination without abnormal findings: Secondary | ICD-10-CM

## 2021-05-23 DIAGNOSIS — R7301 Impaired fasting glucose: Secondary | ICD-10-CM

## 2021-05-24 LAB — CBC WITH DIFFERENTIAL/PLATELET
Basophils Absolute: 0 10*3/uL (ref 0.0–0.2)
Basos: 1 %
EOS (ABSOLUTE): 0.2 10*3/uL (ref 0.0–0.4)
Eos: 3 %
Hematocrit: 42.4 % (ref 37.5–51.0)
Hemoglobin: 14.5 g/dL (ref 13.0–17.7)
Immature Grans (Abs): 0 10*3/uL (ref 0.0–0.1)
Immature Granulocytes: 0 %
Lymphocytes Absolute: 1.9 10*3/uL (ref 0.7–3.1)
Lymphs: 34 %
MCH: 30.9 pg (ref 26.6–33.0)
MCHC: 34.2 g/dL (ref 31.5–35.7)
MCV: 90 fL (ref 79–97)
Monocytes Absolute: 0.5 10*3/uL (ref 0.1–0.9)
Monocytes: 9 %
Neutrophils Absolute: 2.9 10*3/uL (ref 1.4–7.0)
Neutrophils: 53 %
Platelets: 256 10*3/uL (ref 150–450)
RBC: 4.7 x10E6/uL (ref 4.14–5.80)
RDW: 12.4 % (ref 11.6–15.4)
WBC: 5.5 10*3/uL (ref 3.4–10.8)

## 2021-05-24 LAB — COMPREHENSIVE METABOLIC PANEL
ALT: 30 IU/L (ref 0–44)
AST: 22 IU/L (ref 0–40)
Albumin/Globulin Ratio: 2.1 (ref 1.2–2.2)
Albumin: 4.9 g/dL (ref 4.0–5.0)
Alkaline Phosphatase: 44 IU/L (ref 44–121)
BUN/Creatinine Ratio: 21 — ABNORMAL HIGH (ref 9–20)
BUN: 21 mg/dL (ref 6–24)
Bilirubin Total: 0.5 mg/dL (ref 0.0–1.2)
CO2: 23 mmol/L (ref 20–29)
Calcium: 9.7 mg/dL (ref 8.7–10.2)
Chloride: 101 mmol/L (ref 96–106)
Creatinine, Ser: 1 mg/dL (ref 0.76–1.27)
Globulin, Total: 2.3 g/dL (ref 1.5–4.5)
Glucose: 98 mg/dL (ref 70–99)
Potassium: 4.8 mmol/L (ref 3.5–5.2)
Sodium: 138 mmol/L (ref 134–144)
Total Protein: 7.2 g/dL (ref 6.0–8.5)
eGFR: 92 mL/min/{1.73_m2} (ref >59)

## 2021-05-24 LAB — LIPID PANEL
Chol/HDL Ratio: 3.6 ratio (ref 0.0–5.0)
Cholesterol, Total: 137 mg/dL (ref 100–199)
HDL: 38 mg/dL — ABNORMAL LOW (ref >39)
LDL Chol Calc (NIH): 81 mg/dL (ref 0–99)
Triglycerides: 92 mg/dL (ref 0–149)
VLDL Cholesterol Cal: 18 mg/dL (ref 5–40)

## 2021-05-24 LAB — PSA: Prostate Specific Ag, Serum: 0.4 ng/mL (ref 0.0–4.0)

## 2021-05-25 NOTE — Progress Notes (Signed)
Chief Complaint  Patient presents with   Annual Exam    Jordan Stafford is a 50 y.o. male who presents for a complete physical.  He had labs done prior to his visit, see below. He has the following concerns:  History of elevated BP's, in office related to anxiety.  Checks at home periodically, when feeling good his BP is 120's/80's (goes up if he continues to check, gets anxious). Tries to follow low sodium diet, but not always. BP Readings from Last 3 Encounters:  05/26/21 138/86  05/23/20 (!) 142/88  11/23/19 130/86   Hypertriglyceridemia: He is taking fenofibrate regularly without side effects (upsets his stomach if taken without food). He tries to follow a lowfat, low cholesterol diet.  He had labs done prior to today's visit, see below.   Impaired fasting glucose:  He tries to limit his sugar and sweets.  Last year his fasting sugar was up when he had been eating more [employer] candy.  He no longer buys candy. A1c's have been normal. Lab Results  Component Value Date   HGBA1C 5.2 05/23/2020   GERD: Under the care of Dr. Hilarie Fredrickson.  He has been taking prevacid 24m before lunch.  He hasn't been using any pepcid in the evening, though there have been times he has needed it (wasn't sure he could take, the PPI was switched).   Denies dysphagia, wheezing. Denies heartburn (took about a month to adjust from change in PPI from the [facility].   H/o SVT: He has been off diltiazem for years (2013), with no recurrences of SVT.  Had normal stress test 08/2013.  He has had no further episodes of SVT since 2008. Released from care of cardiologist in 01/2014.    Immunization History  Administered Date(s) Administered   Influenza Split 05/27/2011   Influenza,inj,Quad PF,6+ Mos 04/11/2014, 04/15/2015, 04/15/2016, 04/12/2017, 03/29/2018, 04/10/2019, 05/09/2020, 05/15/2021   PFIZER(Purple Top)SARS-COV-2 Vaccination 08/03/2019, 08/24/2019, 04/25/2020, 12/09/2020   Pfizer Covid-19 Vaccine Bivalent  Booster 143yr& up 05/15/2021   Tdap 07/25/2008, 04/14/2018   Varicella 05/18/2017, 06/22/2017   Last colonoscopy: 08/2019 with Dr. PyHilarie Fredrickson 2 polyps (tubular adenomas), small internal hemorrhoids.  F/u 7 years recommended   Last PSA: with recent labs: Lab Results  Component Value Date   PSA1 0.4 05/23/2021   PSA1 0.5 05/21/2020  Dentist: twice yearly   Ophtho: once yearly   Exercise:   "not much"  Recently joined [employer] then daughter got sick. Only going for her swim lessions, getting in the pool with her. (He doesn't swim, using noodle and kicking). +weight-bearing lifting daughter to play with her.  Walks fast between classes (<10 minutes).    PMH, PSH, SH and FH were reviewed and updated  Outpatient Encounter Medications as of 05/26/2021  Medication Sig Note   Choline Fenofibrate (FENOFIBRIC ACID) 135 MG CPDR Take 135 mg by mouth daily.    lansoprazole (PREVACID) 30 MG capsule Take 1 capsule (30 mg total) by mouth daily.    Multiple Vitamin (MULTIVITAMINS PO) Take by mouth daily.   04/15/2015: Takes daily    OMEGA 3 1000 MG CAPS Take 1,000-2,000 mg by mouth daily.  03/13/2019: Takes 2 daily   ALPRAZolam (XANAX) 0.25 MG tablet Take 1-2 tablets (0.25-0.5 mg total) by mouth 3 (three) times daily as needed for anxiety or sleep. (Patient not taking: No sig reported) 05/26/2021: Never started   cetirizine (ZYRTEC) 10 MG tablet Take 10 mg by mouth daily. (Patient not taking: Reported on  05/26/2021) 05/26/2021: Last taken over summer   famotidine (PEPCID) 10 MG tablet Take 20 mg by mouth at bedtime. (Patient not taking: Reported on 05/26/2021)    simethicone (MYLICON) 097 MG chewable tablet Chew 125 mg by mouth every 6 (six) hours as needed. Reported on 11/26/2015 (Patient not taking: No sig reported) 05/26/2021: PRN- last taken a week   [DISCONTINUED] fenofibrate 160 MG tablet Take 1 tablet (160 mg total) by mouth daily.    [DISCONTINUED] ranitidine (RANITIDINE 75) 75 MG tablet Take 75  mg by mouth 2 (two) times daily.      No facility-administered encounter medications on file as of 05/26/2021.   No Known Allergies   ROS: The patient denies anorexia, fever, weight changes, headaches (occ tension or weather-related HA), vision loss, decreased hearing, ear pain, hoarseness, chest pain, dizziness, syncope, dyspnea on exertion, cough, swelling, nausea, vomiting, constipation, abdominal pain, melena, hematochezia, hematuria, incontinence, erectile dysfunction, nocturia, weakened urine stream, dysuria, genital lesions, joint pains, numbness, tingling, weakness, tremor, depression, abnormal bleeding/bruising, or enlarged lymph nodes   No recurrences of SVT Heartburn, depending on diet. Doing well overall on mid-day PPI per Dr. Hilarie Fredrickson. Tonsillar stones intermittently, easily to remove with his tongue   PHYSICAL EXAM:  BP 138/86 (BP Location: Right Arm, Patient Position: Sitting)   Pulse (!) 102   Ht 5' 8.5" (1.74 m)   Wt 174 lb (78.9 kg)   SpO2 98%   BMI 26.07 kg/m   140/94 on repeat by MD  Wt Readings from Last 3 Encounters:  05/26/21 174 lb (78.9 kg)  05/23/20 175 lb (79.4 kg)  11/23/19 171 lb (77.6 kg)    General Appearance:   Alert, cooperative, no distress, appears stated age, in good spirits  Head:   Normocephalic, without obvious abnormality, atraumatic    Eyes:   PERRL, conjunctiva/corneas clear, EOM's intact, fundi benign    Ears:   Normal TM's and external ear canals    Nose:   Not examined, wearing mask due to COVID-19 pandemic  Throat:   Not examined, wearing mask due to COVID-19 pandemic  Neck:   Supple, no lymphadenopathy; thyroid: no enlargement/tenderness/ nodules; no carotid bruit or JVD    Back:   Spine nontender, no curvature, ROM normal, no CVA tenderness    Lungs:   Clear to auscultation bilaterally without wheezes, rales or ronchi; respirations unlabored    Chest Wall:   No tenderness or deformity    Heart:   Regular rate and rhythm, S1 and S2  normal, no murmur, rub or gallop    Breast Exam:   No chest wall tenderness, masses or gynecomastia    Abdomen:   Soft, non-tender, nondistended, normoactive bowel sounds, no masses, no hepatosplenomegaly    Genitalia:   Normal male external genitalia without lesions. Testicles without masses. No inguinal hernias.    Rectal:   Normal sphincter tone, no masses or tenderness; heme negative stool. Prostate smooth, no nodules, not enlarged.    Extremities:   No clubbing, cyanosis or edema  Pulses:   2+ and symmetric all extremities    Skin:   Skin color, texture, turgor normal.   Lymph nodes:   Cervical, supraclavicular, and inguinal nodes normal    Neurologic:   Normal strength, sensation and gait; reflexes 2+ and symmetric throughout                 Psych:  Normal mood, hygiene and grooming. Full range of affect   Lab Results  Component Value  Date   CHOL 137 05/23/2021   HDL 38 (L) 05/23/2021   LDLCALC 81 05/23/2021   TRIG 92 05/23/2021   CHOLHDL 3.6 05/23/2021     Chemistry      Component Value Date/Time   NA 138 05/23/2021 0845   K 4.8 05/23/2021 0845   CL 101 05/23/2021 0845   CO2 23 05/23/2021 0845   BUN 21 05/23/2021 0845   CREATININE 1.00 05/23/2021 0845   CREATININE 1.07 04/22/2017 0754      Component Value Date/Time   CALCIUM 9.7 05/23/2021 0845   ALKPHOS 44 05/23/2021 0845   AST 22 05/23/2021 0845   ALT 30 05/23/2021 0845   BILITOT 0.5 05/23/2021 0845     Fasting glucose 98  Lab Results  Component Value Date   WBC 5.5 05/23/2021   HGB 14.5 05/23/2021   HCT 42.4 05/23/2021   MCV 90 05/23/2021   PLT 256 05/23/2021   Lab Results  Component Value Date   PSA1 0.4 05/23/2021   PSA1 0.5 05/21/2020   ASSESSMENT/PLAN:  Routine general medical examination at a health care facility - Plan: Comprehensive metabolic panel, CBC with Differential/Platelet, Lipid panel, PSA  Impaired fasting glucose - fasting sugar improved.  Cont lowfat, low carb diet and regular  exercise - Plan: Comprehensive metabolic panel  Gastroesophageal reflux disease, unspecified whether esophagitis present - Plan: Comprehensive metabolic panel, CBC with Differential/Platelet  Screening for prostate cancer - Plan: PSA  Hypertriglyceridemia - cont low fat diet and fenofibrate. counseled on diet. - Plan: Choline Fenofibrate (FENOFIBRIC ACID) 135 MG CPDR, Lipid panel  Medication monitoring encounter - Plan: Comprehensive metabolic panel, Lipid panel  Counseled re: diet/exercise (regarding salt, TG)  Recommended at least 30 minutes of aerobic activity at least 5 days/week, weight-bearing exercise 2-3x/wk; proper sunscreen use reviewed; healthy diet and alcohol recommendations (less than or equal to 2 drinks/day) reviewed; regular seatbelt use; changing batteries in smoke detectors. Immunization recommendations discussed--UTD. Continue yearly flu shots. Discussed potential for Shingrix (He is at lower risk than someone who had chicken pox, but not zero).  He had varicella vaccines in 2018  Risks/SE reviewed and can schedule NV when desired. Colonoscopy recommendations reviewed--UTD, 7 year f/u recommended, 08/2026.   F/u 1 year with fasting labs prior, sooner prn. c-met, CBC, lipid, PSA

## 2021-05-25 NOTE — Patient Instructions (Addendum)
  HEALTH MAINTENANCE RECOMMENDATIONS:  It is recommended that you get at least 30 minutes of aerobic exercise at least 5 days/week (for weight loss, you may need as much as 60-90 minutes). This can be any activity that gets your heart rate up. This can be divided in 10-15 minute intervals if needed, but try and build up your endurance at least once a week.  Weight bearing exercise is also recommended twice weekly.  Eat a healthy diet with lots of vegetables, fruits and fiber.  "Colorful" foods have a lot of vitamins (ie green vegetables, tomatoes, red peppers, etc).  Limit sweet tea, regular sodas and alcoholic beverages, all of which has a lot of calories and sugar.  Up to 2 alcoholic drinks daily may be beneficial for men (unless trying to lose weight, watch sugars).  Drink a lot of water.  Sunscreen of at least SPF 30 should be used on all sun-exposed parts of the skin when outside between the hours of 10 am and 4 pm (not just when at beach or pool, but even with exercise, golf, tennis, and yard work!)  Use a sunscreen that says "broad spectrum" so it covers both UVA and UVB rays, and make sure to reapply every 1-2 hours.  Remember to change the batteries in your smoke detectors when changing your clock times in the spring and fall.  Carbon monoxide detectors are recommended for your home.  Use your seat belt every time you are in a car, and please drive safely and not be distracted with cell phones and texting while driving.   You can consider getting a shingles vaccine (as you know, your risk is lower than someone who has had chicken pox, but not zero, as the vaccine was a live virus vaccine).  If desired, you need to wait 2 weeks from your last vaccines. I encourage you to check with your insurance to ensure that it is covered completely and no out of pocket expensive, and then set up nurse visits for the vaccines.  They are given 2 months apart (a total of two doses).

## 2021-05-26 ENCOUNTER — Encounter: Payer: Self-pay | Admitting: Family Medicine

## 2021-05-26 ENCOUNTER — Ambulatory Visit (INDEPENDENT_AMBULATORY_CARE_PROVIDER_SITE_OTHER): Payer: BC Managed Care – PPO | Admitting: Family Medicine

## 2021-05-26 ENCOUNTER — Other Ambulatory Visit: Payer: Self-pay

## 2021-05-26 VITALS — BP 138/86 | HR 102 | Ht 68.5 in | Wt 174.0 lb

## 2021-05-26 DIAGNOSIS — K219 Gastro-esophageal reflux disease without esophagitis: Secondary | ICD-10-CM | POA: Diagnosis not present

## 2021-05-26 DIAGNOSIS — E781 Pure hyperglyceridemia: Secondary | ICD-10-CM

## 2021-05-26 DIAGNOSIS — Z0000 Encounter for general adult medical examination without abnormal findings: Secondary | ICD-10-CM

## 2021-05-26 DIAGNOSIS — Z125 Encounter for screening for malignant neoplasm of prostate: Secondary | ICD-10-CM | POA: Diagnosis not present

## 2021-05-26 DIAGNOSIS — R7301 Impaired fasting glucose: Secondary | ICD-10-CM | POA: Diagnosis not present

## 2021-05-26 DIAGNOSIS — Z5181 Encounter for therapeutic drug level monitoring: Secondary | ICD-10-CM

## 2021-05-26 LAB — POCT URINALYSIS DIP (CLINITEK)
Bilirubin, UA: NEGATIVE
Blood, UA: NEGATIVE
Glucose, UA: NEGATIVE mg/dL
Ketones, POC UA: NEGATIVE mg/dL
Leukocytes, UA: NEGATIVE
Nitrite, UA: NEGATIVE
POC PROTEIN,UA: NEGATIVE
Spec Grav, UA: 1.025 (ref 1.010–1.025)
Urobilinogen, UA: 0.2 E.U./dL
pH, UA: 6 (ref 5.0–8.0)

## 2021-05-26 MED ORDER — FENOFIBRIC ACID 135 MG PO CPDR: 135.0000 mg | DELAYED_RELEASE_CAPSULE | Freq: Every day | ORAL | 3 refills | Status: DC

## 2021-05-26 NOTE — Addendum Note (Signed)
Addended by: Sheilah Pigeon A on: 05/26/2021 03:59 PM   Modules accepted: Orders

## 2021-06-05 ENCOUNTER — Ambulatory Visit (INDEPENDENT_AMBULATORY_CARE_PROVIDER_SITE_OTHER): Payer: BC Managed Care – PPO | Admitting: Nurse Practitioner

## 2021-06-05 ENCOUNTER — Encounter: Payer: Self-pay | Admitting: Nurse Practitioner

## 2021-06-05 VITALS — BP 124/90 | HR 96 | Ht 68.5 in | Wt 173.0 lb

## 2021-06-05 DIAGNOSIS — K219 Gastro-esophageal reflux disease without esophagitis: Secondary | ICD-10-CM

## 2021-06-05 MED ORDER — LANSOPRAZOLE 30 MG PO CPDR: 30.0000 mg | DELAYED_RELEASE_CAPSULE | Freq: Every day | ORAL | 3 refills | Status: DC

## 2021-06-05 NOTE — Progress Notes (Signed)
06/05/2021 Jordan Stafford [id number] 1970-09-07   Chief Complaint: GERD follow up, Lansoprazole refill   History of Present Illness: Jordan Stafford is a 50 year old male with a past medical history of SVT, IBS and GERD. He is followed by Dr. Hilarie Fredrickson.  He was previously taking Dexilant which controlled his reflux symptoms, however, his insurance carrier no longer covered this medication and he was switched to Lansoprazole several months ago.  He reported having mild active reflux symptoms for about 30 days after switching to Lansoprazole which resolved.  He typically develops acid reflux if he greasy foods or tomato sauce.  His reflux symptoms are worse during times of heightened stress as well.  It is difficult for him to find time to exercise due to his busy work and family schedule.  He takes Famotidine 20 mg OTC as needed.  He avoids NSAIDs.  He is passing a normal formed brown bowel movement daily.  No rectal bleeding or black stools.  He underwent an EGD 09/21/2019 which was normal.  A colonoscopy was done on the same date and 2 tubular adenomatous polyps were removed from the transverse and rectum.  He was advised by Dr. Hilarie Fredrickson to repeat a colonoscopy in 7 years.  No known family history of esophageal, gastric or colon cancer.  CBC Latest Ref Rng & Units 05/23/2021 05/21/2020 05/16/2019  WBC 3.4 - 10.8 x10E3/uL 5.5 4.8 5.2  Hemoglobin 13.0 - 17.7 g/dL 14.5 14.0 14.0  Hematocrit 37.5 - 51.0 % 42.4 41.2 41.2  Platelets 150 - 450 x10E3/uL 256 208 225    CMP Latest Ref Rng & Units 05/23/2021 05/21/2020 05/16/2019  Glucose 70 - 99 mg/dL 98 108(H) 101(H)  BUN 6 - 24 mg/dL 21 19 19   Creatinine 0.76 - 1.27 mg/dL 1.00 0.99 1.06  Sodium 134 - 144 mmol/L 138 139 141  Potassium 3.5 - 5.2 mmol/L 4.8 4.6 4.8  Chloride 96 - 106 mmol/L 101 101 103  CO2 20 - 29 mmol/L 23 24 25   Calcium 8.7 - 10.2 mg/dL 9.7 10.0 9.9  Total Protein 6.0 - 8.5 g/dL 7.2 7.4 6.9  Total Bilirubin 0.0 - 1.2 mg/dL 0.5 0.5  0.5  Alkaline Phos 44 - 121 IU/L 44 47 46  AST 0 - 40 IU/L 22 23 25   ALT 0 - 44 IU/L 30 27 28     EGD 09/21/2019: - The esophagus was normal. - The stomach was normal. - The examined duodenum was normal.  Colonoscopy 09/21/2019: - One 3 mm polyp in the transverse colon, removed with a cold snare. Resected and retrieved. - One 6 mm polyp in the rectum, removed with a cold snare. Resected and retrieved. - Small internal hemorrhoids. - 7 yr recall 1. Surgical [P], colon, transverse, polyp - TUBULAR ADENOMA. - NO HIGH GRADE DYSPLASIA OR MALIGNANCY. 2. Surgical [P], colon, rectum, polyp - TUBULAR ADENOMA. - NO HIGH GRADE DYSPLASIA OR MALIGNANCY  Current Outpatient Medications on File Prior to Visit  Medication Sig Dispense Refill   cetirizine (ZYRTEC) 10 MG tablet Take 10 mg by mouth as needed.     Choline Fenofibrate (FENOFIBRIC ACID) 135 MG CPDR Take 135 mg by mouth daily. 90 capsule 3   famotidine (PEPCID) 10 MG tablet Take 20 mg by mouth at bedtime.     Multiple Vitamin (MULTIVITAMINS PO) Take by mouth daily.       OMEGA 3 1000 MG CAPS Take 1,000-2,000 mg by mouth daily.      simethicone (  MYLICON) 163 MG chewable tablet Chew 125 mg by mouth as needed. Reported on 11/26/2015     ALPRAZolam (XANAX) 0.25 MG tablet Take 1-2 tablets (0.25-0.5 mg total) by mouth 3 (three) times daily as needed for anxiety or sleep. (Patient not taking: No sig reported) 15 tablet 0   [DISCONTINUED] fenofibrate 160 MG tablet Take 1 tablet (160 mg total) by mouth daily. 90 tablet 3   [DISCONTINUED] ranitidine (RANITIDINE 75) 75 MG tablet Take 75 mg by mouth 2 (two) times daily.       No current facility-administered medications on file prior to visit.   No Known Allergies  Current Medications, Allergies, Past Medical History, Past Surgical History, Family History and Social History were reviewed in [employer] record.  Review of Systems:   Constitutional: Negative for fever, sweats,  chills or weight loss.  Respiratory: Negative for shortness of breath.   Cardiovascular: Negative for chest pain, palpitations and leg swelling.  Gastrointestinal: See HPI.  Musculoskeletal: Negative for back pain or muscle aches.  Neurological: Negative for dizziness, headaches or paresthesias.    Physical Exam: BP 124/90 (BP Location: Left Arm, Patient Position: Sitting, Cuff Size: Normal)   Pulse 96   Ht 5' 8.5" (1.74 m) Comment: height measured without shoes  Wt 173 lb (78.5 kg)   BMI 25.92 kg/m  General: 51 year old male in NAD. Head: Normocephalic and atraumatic. Eyes: No scleral icterus. Conjunctiva pink . Ears: Normal auditory acuity. Mouth: Dentition intact. No ulcers or lesions.  Lungs: Clear throughout to auscultation. Heart: Regular rate and rhythm, no murmur. Abdomen: Soft, nontender and nondistended. No masses or hepatomegaly. Normal bowel sounds x 4 quadrants.  Rectal: Deferred.  Musculoskeletal: Symmetrical with no gross deformities. Extremities: No edema. Neurological: Alert oriented x 4. No focal deficits.  Psychological: Alert and cooperative. Normal mood and affect  Assessment and Recommendations:  34) 50 year old male with a history of GERD.  Normal EGD 08/2019.  -Continue Lansoprazole 30 mg one po QD -Okay to take Famotidine 20 to 40 mg p.o. once daily as needed -GERD handout  -Encouraged patient to find time to exercise -Follow up in the office in 1 year and as needed   2) History of one tubular adenomatous rectal polyp and one tubular adenomatous colon polyp per colonoscopy 08/2020.  No family history of colorectal cancer. -Next colonoscopy due 08/2027

## 2021-06-05 NOTE — Patient Instructions (Addendum)
MEDICATION: We have sent the following medication to your pharmacy for you to pick up at your convenience: Lansoprazole 30 MG capsule, take 1 a day 30 minutes before breakfast.  RECOMMENDATIONS: Follow up in 1 year or sooner if needed. See GERD information.  It was great seeing you today! Thank you for entrusting me with your care and choosing [facility].  Noralyn Pick, CRNP  Gastroesophageal Reflux Disease, Adult Gastroesophageal reflux (GER) happens when acid from the stomach flows up into the tube that connects the mouth and the stomach (esophagus). Normally, food travels down the esophagus and stays in the stomach to be digested. With GER, food and stomach acid sometimes move back up into the esophagus. You may have a disease called gastroesophageal reflux disease (GERD) if the reflux: Happens often. Causes frequent or very bad symptoms. Causes problems such as damage to the esophagus. When this happens, the esophagus becomes sore and swollen. Over time, GERD can make small holes (ulcers) in the lining of the esophagus. What are the causes? This condition is caused by a problem with the muscle between the esophagus and the stomach. When this muscle is weak or not normal, it does not close properly to keep food and acid from coming back up from the stomach. The muscle can be weak because of: Tobacco use. Pregnancy. Having a certain type of hernia (hiatal hernia). Alcohol use. Certain foods and drinks, such as coffee, chocolate, onions, and peppermint. What increases the risk? Being overweight. Having a disease that affects your connective tissue. Taking NSAIDs, such a ibuprofen. What are the signs or symptoms? Heartburn. Difficult or painful swallowing. The feeling of having a lump in the throat. A bitter taste in the mouth. Bad breath. Having a lot of saliva. Having an upset or bloated stomach. Burping. Chest pain. Different conditions can cause chest  pain. Make sure you see your doctor if you have chest pain. Shortness of breath or wheezing. A long-term cough or a cough at night. Wearing away of the surface of teeth (tooth enamel). Weight loss. How is this treated? Making changes to your diet. Taking medicine. Having surgery. Treatment will depend on how bad your symptoms are. Follow these instructions at home: Eating and drinking  Follow a diet as told by your doctor. You may need to avoid foods and drinks such as: Coffee and tea, with or without caffeine. Drinks that contain alcohol. Energy drinks and sports drinks. Bubbly (carbonated) drinks or sodas. Chocolate and cocoa. Peppermint and mint flavorings. Garlic and onions. Horseradish. Spicy and acidic foods. These include peppers, chili powder, curry powder, vinegar, hot sauces, and BBQ sauce. Citrus fruit juices and citrus fruits, such as oranges, lemons, and limes. Tomato-based foods. These include red sauce, chili, salsa, and pizza with red sauce. Fried and fatty foods. These include donuts, french fries, potato chips, and high-fat dressings. High-fat meats. These include hot dogs, rib eye steak, sausage, ham, and bacon. High-fat dairy items, such as whole milk, butter, and cream cheese. Eat small meals often. Avoid eating large meals. Avoid drinking large amounts of liquid with your meals. Avoid eating meals during the 2-3 hours before bedtime. Avoid lying down right after you eat. Do not exercise right after you eat. Lifestyle  Do not smoke or use any products that contain nicotine or tobacco. If you need help quitting, ask your doctor. Try to lower your stress. If you need help doing this, ask your doctor. If you are overweight, lose an amount of weight  that is healthy for you. Ask your doctor about a safe weight loss goal. General instructions Pay attention to any changes in your symptoms. Take over-the-counter and prescription medicines only as told by your  doctor. Do not take aspirin, ibuprofen, or other NSAIDs unless your doctor says it is okay. Wear loose clothes. Do not wear anything tight around your waist. Raise (elevate) the head of your bed about 6 inches (15 cm). You may need to use a wedge to do this. Avoid bending over if this makes your symptoms worse. Keep all follow-up visits. Contact a doctor if: You have new symptoms. You lose weight and you do not know why. You have trouble swallowing or it hurts to swallow. You have wheezing or a cough that keeps happening. You have a hoarse voice. Your symptoms do not get better with treatment. Get help right away if: You have sudden pain in your arms, neck, jaw, teeth, or back. You suddenly feel sweaty, dizzy, or light-headed. You have chest pain or shortness of breath. You vomit and the vomit is green, yellow, or black, or it looks like blood or coffee grounds. You faint. Your poop (stool) is red, bloody, or black. You cannot swallow, drink, or eat. These symptoms may represent a serious problem that is an emergency. Do not wait to see if the symptoms will go away. Get medical help right away. Call your local emergency services (911 in the [place] Do not drive yourself to the hospital. Summary If a person has gastroesophageal reflux disease (GERD), food and stomach acid move back up into the esophagus and cause symptoms or problems such as damage to the esophagus. Treatment will depend on how bad your symptoms are. Follow a diet as told by your doctor. Take all medicines only as told by your doctor. This information is not intended to replace advice given to you by your health care provider. Make sure you discuss any questions you have with your health care provider. Document Revised: 01/22/2020 Document Reviewed: 01/22/2020 Elsevier Patient Education  2022 [place] providers would like to encourage you to use [facility] to communicate with providers for non-urgent  requests or questions.  Due to long hold times on the telephone, sending your provider a message by [facility] may be faster and more efficient way to get a response. Please allow 48 business hours for a response.  Please remember that this is for non-urgent requests/questions. If you are age 17 or older, your body mass index should be between 23-30. Your Body mass index is 25.92 kg/m. If this is out of the aforementioned range listed, please consider follow up with your Primary Care Provider.  If you are age 73 or younger, your body mass index should be between 19-25. Your Body mass index is 25.92 kg/m. If this is out of the aformentioned range listed, please consider follow up with your Primary Care Provider.

## 2021-06-09 NOTE — Progress Notes (Signed)
Addendum: Reviewed and agree with assessment and management plan. Jaking Thayer M, MD  

## 2021-06-14 ENCOUNTER — Encounter: Payer: Self-pay | Admitting: Family Medicine

## 2021-06-16 ENCOUNTER — Telehealth (INDEPENDENT_AMBULATORY_CARE_PROVIDER_SITE_OTHER): Payer: BC Managed Care – PPO | Admitting: Family Medicine

## 2021-06-16 ENCOUNTER — Encounter: Payer: Self-pay | Admitting: Family Medicine

## 2021-06-16 ENCOUNTER — Other Ambulatory Visit: Payer: Self-pay

## 2021-06-16 VITALS — BP 139/96 | HR 95 | Temp 98.3°F | Ht 68.5 in | Wt 170.5 lb

## 2021-06-16 DIAGNOSIS — R030 Elevated blood-pressure reading, without diagnosis of hypertension: Secondary | ICD-10-CM

## 2021-06-16 DIAGNOSIS — R059 Cough, unspecified: Secondary | ICD-10-CM

## 2021-06-16 DIAGNOSIS — J019 Acute sinusitis, unspecified: Secondary | ICD-10-CM | POA: Diagnosis not present

## 2021-06-16 MED ORDER — AMOXICILLIN 500 MG PO TABS: 1000.0000 mg | ORAL_TABLET | Freq: Two times a day (BID) | ORAL | 0 refills | Status: DC

## 2021-06-16 NOTE — Patient Instructions (Addendum)
Drink plenty of water. Limit the sodium in your diet to help with blood pressure.  Do a home COVID test.  If negative, then start the antibiotics to treat a sinus infection. (If positive, isolate for another day, and then you need to remain masked for another 5 days--you shouldn't be unmasked (ie eating) around others).  Hoping it is negative!  Start mucinex, and either continue Delsym syrup for the cough, or switch to Mucinex DM. If you need additional cough medications, contact us (we briefly discussed benzonatate prescription).  I hope you feel better soon!

## 2021-06-16 NOTE — Progress Notes (Signed)
Start time: 4:21 End time: 4:45  Virtual Visit via Video Note  I connected with Jordan Stafford on 06/16/21 by a video enabled telemedicine application and verified that I am speaking with the correct person using two identifiers.  Location: Patient: home Provider: office   I discussed the limitations of evaluation and management by telemedicine and the availability of in person appointments. The patient expressed understanding and agreed to proceed.  History of Present Illness:  Chief Complaint  Patient presents with   Cough    VIRTUAL cough and nasal congestion that started about 2 weeks ago. Has not had any fever, except last Wed was low grade for about 6 hrs. Just some chest soreness from coughing. Has not done any home covid tests.   Cough started 2 weeks ago, when his daughter was sick.  It was intermittent.  5 days ago his cough was worse. He had to leave work early, as he was unable to talk to teach, due to coughing. Temp was 99-100 when he got home.  The next day his temp was back to normal. He had diarrhea x1 day (5 days ago) resolved by the following day. Cough has been productive the whole time--it started out as clear phlegm, and has changed to yellowish phlegm in the last 1-2 days.  Discolored drainage from the nose and the phlegm. Denies any sinus pain, no headaches.  He reports the phlegm is somewhat thick, yellow.  OTC meds: Took advil once 5 days ago (when sore from coughing and LG fever). Also taking Delsym for cough Didn't try mucinex, since cough was already productive; was afraid it would make him cough more. He started taking zyrtec last night.  BP was checked the moment he walked in the door from work today.   PMH, PSH, SH reviewed  Outpatient Encounter Medications as of 06/16/2021  Medication Sig Note   cetirizine (ZYRTEC) 10 MG tablet Take 10 mg by mouth as needed.    Choline Fenofibrate (FENOFIBRIC ACID) 135 MG CPDR Take 135 mg by mouth daily.     Dextromethorphan HBr (DELSYM PO) Take 10 mLs by mouth. 06/16/2021: Took this am   lansoprazole (PREVACID) 30 MG capsule Take 1 capsule (30 mg total) by mouth daily. To be taken 30 minutes before breakfast.    Multiple Vitamin (MULTIVITAMINS PO) Take by mouth daily.   04/15/2015: Takes daily    OMEGA 3 1000 MG CAPS Take 1,000-2,000 mg by mouth daily.  03/13/2019: Takes 2 daily   ALPRAZolam (XANAX) 0.25 MG tablet Take 1-2 tablets (0.25-0.5 mg total) by mouth 3 (three) times daily as needed for anxiety or sleep. (Patient not taking: Reported on 08/23/2019) 06/16/2021: Has never taken   famotidine (PEPCID) 10 MG tablet Take 20 mg by mouth at bedtime. (Patient not taking: Reported on 06/16/2021)    simethicone (MYLICON) 937 MG chewable tablet Chew 125 mg by mouth as needed. Reported on 11/26/2015 (Patient not taking: Reported on 06/16/2021) 06/16/2021: Has not taken in a while   [DISCONTINUED] fenofibrate 160 MG tablet Take 1 tablet (160 mg total) by mouth daily.    [DISCONTINUED] ranitidine (RANITIDINE 75) 75 MG tablet Take 75 mg by mouth 2 (two) times daily.      No facility-administered encounter medications on file as of 06/16/2021.   No Known Allergies  ROS: LG fever last week, resolved.  Cough and runny nose per HPI.  No headaches, dizziness, chest pain. No shortness of breath, rash or other concerns. See HPI.  Observations/Objective:  BP (!) 156/103 Comment: just got home from work and has been rushing  Pulse 95   Temp 98.3 F (36.8 C) (Tympanic)   Ht 5' 8.5" (1.74 m)   Wt 170 lb 8 oz (77.3 kg)   BMI 25.55 kg/m   139/96 on repeat by patient  Well-appearing, pleasant male, in no distress He is alert, oriented, with grossly normal cranial nerves. Rare dry-sounding cough during visit. He is speaking comfortably Exam is limited due to virtual nature of the visit.    Assessment and Plan:  Cough, unspecified type - add guaifenesin, cont dextromethorphan prn  Acute non-recurrent  sinusitis, unspecified location - advised to do home COVID test. if negative, start ABX to treat sinus infection. Rec mucinex, consider sinus rinses - Plan: amoxicillin (AMOXIL) 500 MG tablet  Elevated blood pressure reading without diagnosis of hypertension - improved some on recheck. counseled re: low Na diet, and to avoid decongestants. Recheck next week when feeling better.   Home COVID test--if +, isolate for another day, and mask for another 5 days. Hold off on ABX if +  Amox 1000mg  BID x 10d Stay well hydrated Start mucinex and cont Delsym vs switch to Mucinex DM If cough isn't improving, contact us for tessalon/benzonatate prescription    Follow Up Instructions:    I discussed the assessment and treatment plan with the patient. The patient was provided an opportunity to ask questions and all were answered. The patient agreed with the plan and demonstrated an understanding of the instructions.   The patient was advised to call back or seek an in-person evaluation if the symptoms worsen or if the condition fails to improve as anticipated.  I spent 25 minutes dedicated to the care of this patient, including pre-visit review of records, face to face time, post-visit ordering of testing and documentation.    Vikki Ports, MD

## 2021-06-23 ENCOUNTER — Encounter: Payer: Self-pay | Admitting: Family Medicine

## 2021-06-27 ENCOUNTER — Encounter: Payer: Self-pay | Admitting: Family Medicine

## 2021-06-30 ENCOUNTER — Ambulatory Visit: Payer: BC Managed Care – PPO | Admitting: Family Medicine

## 2021-06-30 ENCOUNTER — Other Ambulatory Visit: Payer: Self-pay

## 2021-06-30 ENCOUNTER — Encounter: Payer: Self-pay | Admitting: Family Medicine

## 2021-06-30 VITALS — BP 120/78 | HR 92 | Temp 98.9°F | Ht 68.5 in | Wt 170.2 lb

## 2021-06-30 DIAGNOSIS — R052 Subacute cough: Secondary | ICD-10-CM

## 2021-06-30 DIAGNOSIS — R062 Wheezing: Secondary | ICD-10-CM | POA: Diagnosis not present

## 2021-06-30 MED ORDER — PREDNISONE 20 MG PO TABS: 20.0000 mg | ORAL_TABLET | Freq: Two times a day (BID) | ORAL | 0 refills | Status: DC

## 2021-06-30 NOTE — Progress Notes (Signed)
Chief Complaint  Patient presents with   Cough    Has had a cough for a month. Was treated on 11/21 with amoxil 1000 BID x 10 days. Symptoms had originally started around 11/7. Can go without coughing for a while then has fits of coughing and feels like he wheezes.    11/21--video visit with 2 weeks of symptoms (cough, started after daughter had been sick with virus).  He was treated for sinus infection with amoxil. He has had ongoing cough since that time. He has some mild head congestion and ear plugging. The mucus is now mostly clear, occasionally just a small amount of light yellow. Denies any sinus pain or fever.   Noticed some blood from R nostril periodically. He hears a slight wheeze with a hard cough or laugh (in upper throat/chest). Denies any shortness of breath or DOE.  He coughs more if in the cold (outside, or looking for something in the freezer).  Doesn't occur with talking a lot (just initially prior to antibiotics, not recently).   Taking zyrtec daily. Never took benzonatate in the past. Recalls taking prednisone for cough in the past. (2018, 20mg  BID x 5d)  PMH, PSH, SH reviewed  Outpatient Encounter Medications as of 06/30/2021  Medication Sig Note   cetirizine (ZYRTEC) 10 MG tablet Take 10 mg by mouth as needed.    Choline Fenofibrate (FENOFIBRIC ACID) 135 MG CPDR Take 135 mg by mouth daily.    lansoprazole (PREVACID) 30 MG capsule Take 1 capsule (30 mg total) by mouth daily. To be taken 30 minutes before breakfast.    Multiple Vitamin (MULTIVITAMINS PO) Take by mouth daily.   04/15/2015: Takes daily    OMEGA 3 1000 MG CAPS Take 1,000-2,000 mg by mouth daily.  03/13/2019: Takes 2 daily   predniSONE (DELTASONE) 20 MG tablet Take 1 tablet (20 mg total) by mouth 2 (two) times daily with a meal.    ALPRAZolam (XANAX) 0.25 MG tablet Take 1-2 tablets (0.25-0.5 mg total) by mouth 3 (three) times daily as needed for anxiety or sleep. (Patient not taking: Reported on  08/23/2019) 06/16/2021: Has never taken   famotidine (PEPCID) 10 MG tablet Take 20 mg by mouth at bedtime. (Patient not taking: Reported on 06/16/2021) 06/30/2021: As needed   simethicone (MYLICON) 389 MG chewable tablet Chew 125 mg by mouth as needed. Reported on 11/26/2015 (Patient not taking: Reported on 06/16/2021) 06/16/2021: Has not taken in a while   [DISCONTINUED] amoxicillin (AMOXIL) 500 MG tablet Take 2 tablets (1,000 mg total) by mouth 2 (two) times daily.    [DISCONTINUED] Dextromethorphan HBr (DELSYM PO) Take 10 mLs by mouth. 06/16/2021: Took this am   [DISCONTINUED] fenofibrate 160 MG tablet Take 1 tablet (160 mg total) by mouth daily.    [DISCONTINUED] ranitidine (RANITIDINE 75) 75 MG tablet Take 75 mg by mouth 2 (two) times daily.      No facility-administered encounter medications on file as of 06/30/2021.   No Known Allergies  ROS: No n/v/d (diarrhea only early on with the ABX). Minimal heartburn. No trouble sleeping related to the cough. Cough per HPI.   PHYSICAL EXAM:  BP 120/78   Pulse 92   Temp 98.9 F (37.2 C) (Tympanic)   Ht 5' 8.5" (1.74 m)   Wt 170 lb 3.2 oz (77.2 kg)   BMI 25.50 kg/m   Well-appearing male, with occasional deep cough.  He is speaking comfortably and in no distress HEENT: conjunctiva and sclera are clear, EOMI. TM's and  EAc's normal. Nasal mucosa with mod edema and clear mucus on the R Sinuses nontender.  OP clear Neck: No lymphadenopathy Heart: regular rate and rhythm, no murmur Lungs: Trace wheeze bilaterally. Good air movement. Psych: normal mood, affect, hygiene and grooming Neuro: alert and oriented, cranial nerves grossly intact, normal gait.   ASSESSMENT/PLAN:  Subacute cough - persistent cough s/p viral URI c/b sinusitis.  sx of sinusitis improved. Course of prednisone to help with cough, given wheezing.  Risks/SE reviewed - Plan: predniSONE (DELTASONE) 20 MG tablet  Wheezing - Plan: predniSONE (DELTASONE) 20 MG  tablet   Continue zyrtec daily. Take the prednisone twice daily for 5 days. Stay well hydrated. If you need additional medication for the cough, you can try the Delsym syrup, or call us for the benzonatate (tessalon perles) prescription.  Be sure to let us know if you develop any discolored mucus or phlegm, fever, shortness of breath, or other new symptoms.

## 2021-06-30 NOTE — Patient Instructions (Signed)
  Continue zyrtec daily. Take the prednisone twice daily for 5 days. Stay well hydrated. If you need additional medication for the cough, you can try the Delsym syrup, or call us for the benzonatate (tessalon perles) prescription.  Be sure to let us know if you develop any discolored mucus or phlegm, fever, shortness of breath, or other new symptoms.

## 2021-08-01 ENCOUNTER — Encounter: Payer: Self-pay | Admitting: Family Medicine

## 2021-08-04 ENCOUNTER — Encounter: Payer: Self-pay | Admitting: Family Medicine

## 2021-08-04 ENCOUNTER — Other Ambulatory Visit: Payer: Self-pay

## 2021-08-04 ENCOUNTER — Ambulatory Visit: Payer: BC Managed Care – PPO | Admitting: Family Medicine

## 2021-08-04 VITALS — BP 130/76 | HR 96 | Ht 68.5 in | Wt 172.4 lb

## 2021-08-04 DIAGNOSIS — K219 Gastro-esophageal reflux disease without esophagitis: Secondary | ICD-10-CM | POA: Diagnosis not present

## 2021-08-04 DIAGNOSIS — J309 Allergic rhinitis, unspecified: Secondary | ICD-10-CM

## 2021-08-04 DIAGNOSIS — R030 Elevated blood-pressure reading, without diagnosis of hypertension: Secondary | ICD-10-CM | POA: Diagnosis not present

## 2021-08-04 NOTE — Progress Notes (Signed)
Chief Complaint  Patient presents with   Hypertension    Follow up on blood pressure. Was high when he got back from vacation.    Patient sent message last week about elevated blood pressures and GERD. He presents to follow up on these.  Last Thursday, BP 138/87. P 92. Friday morning 138/101, P 92, then reflux started acting up. After watching TV for a couple of hours (a funny show), BP 161/87, P 111. Sunday BP 142/95 P 111  Since Friday morning, he has really been watching diet. He didn't have [employer] or [employer] like the rest of the family, ate something different.  Takes Prevacid once daily, chronically. Hadn't been taking famotidine at night (hasn't needed). I had told him in the My Chart message that he could take PPI twice daily. Fell asleep early, hasn't been taking any evening medication (no famotidine, nor PPI). Reflux seemed a little better Saturday night. Last night GERD was worse--noticed it when he got up to go to the bathroom. Felt better propped up .  He had been on vacation to [place], then Christmas, the sister visited. Change in diet during that time--"I ate all the stuff you shouldn't eat" More fried foods, ketchup, eating larger meals No caffeine or alcohol  Didn't notice GERD symptoms until he noticed higher blood pressure.  He also reports feeling like there is a "cloud over my head" the last few days. Almost feels unbalanced with walking. Woozy after he bent down to pick something up. Started drinking more water after that happened.  Denies allergy symptoms. Had been taking zyrtec daily, stopped it 2 days ago since he had been feeling fine. Symptoms of fogginess started around the same time as reflux, maybe just prior to stopping the zyrtec.  PMH, PSH, SH reviewed  Outpatient Encounter Medications as of 08/04/2021  Medication Sig Note   Choline Fenofibrate (FENOFIBRIC ACID) 135 MG CPDR Take 135 mg by mouth daily.    lansoprazole (PREVACID) 30 MG capsule  Take 1 capsule (30 mg total) by mouth daily. To be taken 30 minutes before breakfast.    Multiple Vitamin (MULTIVITAMINS PO) Take by mouth daily.   04/15/2015: Takes daily    OMEGA 3 1000 MG CAPS Take 1,000-2,000 mg by mouth daily.  03/13/2019: Takes 2 daily   simethicone (MYLICON) 096 MG chewable tablet Chew 125 mg by mouth as needed. Reported on 11/26/2015 08/04/2021: Took one today   ALPRAZolam (XANAX) 0.25 MG tablet Take 1-2 tablets (0.25-0.5 mg total) by mouth 3 (three) times daily as needed for anxiety or sleep. (Patient not taking: Reported on 08/23/2019) 06/16/2021: Has never taken   cetirizine (ZYRTEC) 10 MG tablet Take 10 mg by mouth as needed. (Patient not taking: Reported on 08/04/2021) 08/04/2021: As needed   famotidine (PEPCID) 10 MG tablet Take 20 mg by mouth at bedtime. (Patient not taking: Reported on 06/16/2021) 06/30/2021: As needed   [DISCONTINUED] fenofibrate 160 MG tablet Take 1 tablet (160 mg total) by mouth daily.    [DISCONTINUED] predniSONE (DELTASONE) 20 MG tablet Take 1 tablet (20 mg total) by mouth 2 (two) times daily with a meal.    [DISCONTINUED] ranitidine (RANITIDINE 75) 75 MG tablet Take 75 mg by mouth 2 (two) times daily.      No facility-administered encounter medications on file as of 08/04/2021.   No Known Allergies  ROS: no fever, chills, URI symptoms, runny nose, sneezing, cough, chest pain. No headaches.  Slight balance issue and dizziness per HPI. Some fluttering of  heart yesterday, short-lived.  No tachycardia or palpitations other times. Some diarrhea yesterday.  No blood in the stool. See HPI   PHYSICAL EXAM:  BP 130/76    Pulse 96    Ht 5' 8.5" (1.74 m)    Wt 172 lb 6.4 oz (78.2 kg)    BMI 25.83 kg/m   Well-appearing male, in no distress. He appears calm, no particularly anxious today. HEENT: conjunctiva and sclera are clear, EOMI. Fundi benign. TM's and EAC's normal. Nasal mucosa is mildly edematous, with clear mucus on R, less, but some clear/stringy mucus  on the L also. Sinuses nontender. OP is clear Neck: no lymphadenopathy or mass. Some muscular tenderness posteriorly Heart: regular rate and rhythm, no murmur Lungs: clear bilaterally Back: no spinal or CVA tenderness Abdomen: +epigastric tenderness. No organomegaly or mass Extremities: no edema Psych :normal mood, affect, hygiene and grooming Neuro: alert and oriented, normal strength, gait   ASSESSMENT/PLAN:  Elevated blood pressure reading without diagnosis of hypertension - reassurred BP improved today. cont low Na diet, encouraged daily exercise.  Gastroesophageal reflux disease, unspecified whether esophagitis present - flaring related to poor diet. To restart famotidine each evening, cont PPI qAM.  Allergic rhinitis, unspecified seasonality, unspecified trigger - restart Zyrtec. May be having some symptoms related to allergies (balance, fogginess)   Counseled re: healthy diet, exercise.  Encouraged Mediterranean diet.  I spent 34 minutes dedicated to the care of this patient, including pre-visit review of records, face to face time, post-visit ordering of testing and documentation.

## 2021-08-04 NOTE — Patient Instructions (Addendum)
Your blood pressure today was very good. Your pulse being up either suggests a component of worry/anxiety (probably about your blood pressure) or that you aren't drinking enough water.  Please continue to limit your sodium in your diet. Your stomach was a little tender-- Continue the prevacid in the morning, and resume taking famotidine in the evening (you may want to take it prior to dinner, since you're having a hard time remembering to take it at bedtime).  Get back to trying to have a regular exercise routine (minimum of 150 minutes/week).  Restart your zyrtec daily--there was a lot of congestion in your nose, and this may contribute to the fogginess/dizziness sensation you've been feeling.

## 2021-11-23 ENCOUNTER — Emergency Department (HOSPITAL_BASED_OUTPATIENT_CLINIC_OR_DEPARTMENT_OTHER)
Admission: EM | Admit: 2021-11-23 | Discharge: 2021-11-23 | Disposition: A | Discharge status: Home / Self Care | Payer: BC Managed Care – PPO | Attending: Emergency Medicine | Admitting: Emergency Medicine

## 2021-11-23 ENCOUNTER — Encounter (HOSPITAL_BASED_OUTPATIENT_CLINIC_OR_DEPARTMENT_OTHER): Payer: Self-pay | Admitting: Emergency Medicine

## 2021-11-23 ENCOUNTER — Other Ambulatory Visit: Payer: Self-pay

## 2021-11-23 ENCOUNTER — Emergency Department (HOSPITAL_BASED_OUTPATIENT_CLINIC_OR_DEPARTMENT_OTHER): Payer: BC Managed Care – PPO

## 2021-11-23 DIAGNOSIS — R1031 Right lower quadrant pain: Secondary | ICD-10-CM | POA: Diagnosis present

## 2021-11-23 DIAGNOSIS — N130 Hydronephrosis with ureteropelvic junction obstruction: Secondary | ICD-10-CM | POA: Insufficient documentation

## 2021-11-23 DIAGNOSIS — N201 Calculus of ureter: Secondary | ICD-10-CM

## 2021-11-23 LAB — URINALYSIS, ROUTINE W REFLEX MICROSCOPIC
Bilirubin Urine: NEGATIVE
Glucose, UA: NEGATIVE mg/dL
Ketones, ur: NEGATIVE mg/dL
Leukocytes,Ua: NEGATIVE
Nitrite: NEGATIVE
Protein, ur: NEGATIVE mg/dL
Specific Gravity, Urine: 1.025 (ref 1.005–1.030)
pH: 5.5 (ref 5.0–8.0)

## 2021-11-23 LAB — URINALYSIS, MICROSCOPIC (REFLEX)

## 2021-11-23 MED ORDER — ONDANSETRON 8 MG PO TBDP: 8.0000 mg | ORAL_TABLET | Freq: Three times a day (TID) | ORAL | 0 refills | Status: DC | PRN

## 2021-11-23 MED ORDER — TAMSULOSIN HCL 0.4 MG PO CAPS
0.4000 mg | ORAL_CAPSULE | Freq: Once | ORAL | Status: AC
  Administered 2021-11-23: 0.4 mg via ORAL
  Filled 2021-11-23: qty 1

## 2021-11-23 MED ORDER — KETOROLAC TROMETHAMINE 15 MG/ML IJ SOLN
15.0000 mg | Freq: Once | INTRAMUSCULAR | Status: AC
  Administered 2021-11-23: 15 mg via INTRAVENOUS
  Filled 2021-11-23: qty 1

## 2021-11-23 MED ORDER — HYDROMORPHONE HCL 1 MG/ML IJ SOLN
1.0000 mg | Freq: Once | INTRAMUSCULAR | Status: AC | PRN
  Administered 2021-11-23: 1 mg via INTRAVENOUS
  Filled 2021-11-23: qty 1

## 2021-11-23 MED ORDER — HYDROMORPHONE HCL 1 MG/ML IJ SOLN: 1.0000 mg | Freq: Once | INTRAMUSCULAR | Status: DC

## 2021-11-23 MED ORDER — TAMSULOSIN HCL 0.4 MG PO CAPS: ORAL_CAPSULE | ORAL | 0 refills | Status: DC

## 2021-11-23 MED ORDER — ONDANSETRON HCL 4 MG/2ML IJ SOLN
4.0000 mg | Freq: Once | INTRAMUSCULAR | Status: AC
  Administered 2021-11-23: 4 mg via INTRAVENOUS
  Filled 2021-11-23: qty 2

## 2021-11-23 MED ORDER — HYDROMORPHONE HCL 2 MG PO TABS: 2.0000 mg | ORAL_TABLET | ORAL | 0 refills | Status: DC | PRN

## 2021-11-23 NOTE — ED Triage Notes (Signed)
R flank and RLQ pain that started suddenly around 0300. Pt has remote hx of kidney stones. Reports no fever, no difficulty with urination. Denies vomiting. Pt ambulatory without difficulty to tx room.  ?

## 2021-11-23 NOTE — ED Provider Notes (Signed)
? ?[facility] ?Provider Note: Georgena Spurling, MD, FACEP ? ?CSN: [id number] ?MRN: [id number] ?ARRIVAL: 11/23/21 at [place] ?ROOM: [id number] ? ? ?CHIEF COMPLAINT  ?Flank Pain ? ? ?HISTORY OF PRESENT ILLNESS  ?11/23/21 4:25 AM ?NOLON YELLIN is a 51 y.o. male with history of kidney stones.  He is here with right flank pain that began suddenly about 3 AM.  It was severe at home but is now down to about 3 out of 10.  It does not worse with movement or palpation.  It is aching in nature.  He is not nauseated with it and has noticed no hematuria or difficulty urinating. ? ? ?Past Medical History:  ?Diagnosis Date  ? Acne   ? Anxiety   ? GERD (gastroesophageal reflux disease)   ? Hx of cardiovascular stress test   ? ETT-Myoview (08/2013):  No ischemia, EF 67%; normal study.  ? IBS (irritable bowel syndrome)   ? Renal stone   ? Seasonal allergies   ? Supraventricular tachycardia   ? Dr Caryl Comes  ? ? ?Past Surgical History:  ?Procedure Laterality Date  ? MOUTH SURGERY    ? x3  ? TOENAIL EXCISION    ? ingrown nail  ? ? ?Family History  ?Problem Relation Age of Onset  ? Yves Dill Parkinson White syndrome Mother   ? Hypertension Mother   ? COPD Mother   ?     smoker  ? Urolithiasis Mother   ? Diabetes Father   ? Hypertension Father   ? Cancer Father 61  ?     renal; bladder CA at 76--removed bladder, prostate and 1 kidney age 1)  ? Kidney cancer Father   ? Heart disease Maternal Grandmother   ?     MI at 54  ? Cancer Paternal Grandfather   ?     lung  ? Heart disease Paternal Grandmother   ?     late 29's  ? Coronary artery disease Neg Hx   ? ? ?Social History  ? ?Tobacco Use  ? Smoking status: Never  ? Smokeless tobacco: Never  ?Vaping Use  ? Vaping Use: Never used  ?Substance Use Topics  ? Alcohol use: No  ? Drug use: No  ? ? ?Prior to Admission medications   ?Medication Sig Start Date End Date Taking? Authorizing Provider  ?HYDROmorphone (DILAUDID) 2 MG tablet Take 1 tablet (2 mg total) by mouth every 4 (four) hours as needed  for severe pain. 11/23/21  Yes Jamilya Sarrazin, Kason, MD  ?ondansetron (ZOFRAN-ODT) 8 MG disintegrating tablet Take 1 tablet (8 mg total) by mouth every 8 (eight) hours as needed. 11/23/21  Yes Leronda Lewers, Andrae, MD  ?tamsulosin (FLOMAX) 0.4 MG CAPS capsule Take 1 capsule daily until stone passes. 11/23/21  Yes Jozey Janco, Daryon, MD  ?Choline Fenofibrate (FENOFIBRIC ACID) 135 MG CPDR Take 135 mg by mouth daily. 05/26/21   Rita Ohara, MD  ?lansoprazole (PREVACID) 30 MG capsule Take 1 capsule (30 mg total) by mouth daily. To be taken 30 minutes before breakfast. 06/05/21   Noralyn Pick, NP  ?Multiple Vitamin (MULTIVITAMINS PO) Take by mouth daily.      [provider]  ?OMEGA 3 1000 MG CAPS Take 1,000-2,000 mg by mouth daily.     [provider]  ?simethicone (MYLICON) 122 MG chewable tablet Chew 125 mg by mouth as needed. Reported on 11/26/2015    [provider]  ?fenofibrate 160 MG tablet Take 1 tablet (160 mg total) by mouth daily.  05/18/19 08/28/19  Rita Ohara, MD  ?ranitidine (RANITIDINE 75) 75 MG tablet Take 75 mg by mouth 2 (two) times daily.    10/09/11  [provider]  ? ? ?Allergies ?Patient has no known allergies. ? ? ?REVIEW OF SYSTEMS  ?Negative except as noted here or in the History of Present Illness. ? ? ?PHYSICAL EXAMINATION  ?Initial Vital Signs ?Blood pressure 129/84, pulse (!) 102, temperature (!) 97.5 ?F (36.4 ?C), temperature source Oral, resp. rate 20, height '5\' 9"'$  (1.753 m), weight 78 kg, SpO2 99 %. ? ?Examination ?General: Well-developed, well-nourished male in no acute distress; appearance consistent with age of record ?HENT: normocephalic; atraumatic ?Eyes: Normal appearance ?Neck: supple ?Heart: regular rate and rhythm ?Lungs: clear to auscultation bilaterally ?Abdomen: soft; nondistended; nontender; bowel sounds present ?GU: No CVA tenderness ?Extremities: No deformity; full range of motion ?Neurologic: Awake, alert and oriented; motor function intact in all  extremities and symmetric; no facial droop ?Skin: Warm and dry ?Psychiatric: Normal mood and affect ? ? ?RESULTS  ?Summary of this visit's results, reviewed and interpreted by myself: ? ? EKG Interpretation ? ?Date/Time:    ?Ventricular Rate:    ?PR Interval:    ?QRS Duration:   ?QT Interval:    ?QTC Calculation:   ?R Axis:     ?Text Interpretation:   ?  ? ?  ? ?Laboratory Studies: ?Results for orders placed or performed during the hospital encounter of 11/23/21 (from the past 24 hour(s))  ?Urinalysis, Routine w reflex microscopic Urine, Clean Catch     Status: Abnormal  ? Collection Time: 11/23/21  4:17 AM  ?Result Value Ref Range  ? Color, Urine YELLOW YELLOW  ? APPearance CLEAR CLEAR  ? Specific Gravity, Urine 1.025 1.005 - 1.030  ? pH 5.5 5.0 - 8.0  ? Glucose, UA NEGATIVE NEGATIVE mg/dL  ? Hgb urine dipstick LARGE (A) NEGATIVE  ? Bilirubin Urine NEGATIVE NEGATIVE  ? Ketones, ur NEGATIVE NEGATIVE mg/dL  ? Protein, ur NEGATIVE NEGATIVE mg/dL  ? Nitrite NEGATIVE NEGATIVE  ? Leukocytes,Ua NEGATIVE NEGATIVE  ?Urinalysis, Microscopic (reflex)     Status: Abnormal  ? Collection Time: 11/23/21  4:17 AM  ?Result Value Ref Range  ? RBC / HPF 21-50 0 - 5 RBC/hpf  ? WBC, UA 0-5 0 - 5 WBC/hpf  ? Bacteria, UA FEW (A) NONE SEEN  ? Squamous Epithelial / LPF 0-5 0 - 5  ? ?Imaging Studies: ?CT Renal Stone Study ? ?Result Date: 11/23/2021 ?CLINICAL DATA:  51 year old male with right flank pain sudden onset 0300 hours. Prior kidney stones. EXAM: CT ABDOMEN AND PELVIS WITHOUT CONTRAST TECHNIQUE: Multidetector CT imaging of the abdomen and pelvis was performed following the standard protocol without IV contrast. RADIATION DOSE REDUCTION: This exam was performed according to the departmental dose-optimization program which includes automated exposure control, adjustment of the mA and/or kV according to patient size and/or use of iterative reconstruction technique. COMPARISON:  None. FINDINGS: Lower chest: Negative. Hepatobiliary:  Evidence of geographic hepatic steatosis mostly in the right lobe. Some fatty sparing at the gallbladder fossa. Negative noncontrast gallbladder. Pancreas: Negative noncontrast pancreas. Spleen: Negative. Adrenals/Urinary Tract: Normal adrenal glands. Nonobstructed left kidney and left ureter with punctate left nephrolithiasis. Right side hydronephrosis is mild-to-moderate with a proximal obstructing oval right ureteral calculus measuring 5-6 mm and located about 3 cm distal to the right ureteropelvic junction (coronal image 52 and series 2, image 45. Regional periureteral stranding. No additional right intrarenal calculus. Beyond the stone the right ureter is  decompressed. Bladder is decompressed. Stomach/Bowel: No acute finding. Normal appendix on coronal image 54. No free air or free fluid. Vascular/Lymphatic: Normal caliber abdominal aorta. Minimal aortoiliac calcified atherosclerosis. No lymphadenopathy identified. Reproductive: Negative. Other: No pelvic free fluid. Numerous pelvic phleboliths. Musculoskeletal: No acute osseous abnormality identified. IMPRESSION: 1. Acute obstructive uropathy on the right due to a 5-6 mm proximal ureteral calculus located about 3 cm distal to the right UPJ. 2. Punctate left nephrolithiasis. 3. Hepatic steatosis. Electronically Signed   By: Genevie Ann M.D.   On: 11/23/2021 05:14   ? ?ED COURSE and MDM  ?Nursing notes, initial and subsequent vitals signs, including pulse oximetry, reviewed and interpreted by myself. ? ?Vitals:  ? 11/23/21 0419 11/23/21 0422  ?BP:  129/84  ?Pulse:  (!) 102  ?Resp:  20  ?Temp:  (!) 97.5 ?F (36.4 ?C)  ?TempSrc:  Oral  ?SpO2:  99%  ?Weight: 78 kg   ?Height: '5\' 9"'$  (1.753 m)   ? ?Medications  ?ketorolac (TORADOL) 15 MG/ML injection 15 mg (has no administration in time range)  ?tamsulosin (FLOMAX) capsule 0.4 mg (has no administration in time range)  ?ondansetron [facility]) injection 4 mg (4 mg Intravenous Given 11/23/21 0454)  ?HYDROmorphone (DILAUDID)  injection 1 mg (1 mg Intravenous Given 11/23/21 0455)  ? ?4:48 AM ?Agitation consistent with ureteral colic and about a 4 mm stone is seen in the mid left ureter.  Awaiting radiologist interpretation of CT.  Rico Sheehan

## 2021-11-24 ENCOUNTER — Encounter: Payer: Self-pay | Admitting: Family Medicine

## 2021-11-24 ENCOUNTER — Other Ambulatory Visit: Payer: Self-pay | Admitting: Urology

## 2021-12-01 ENCOUNTER — Encounter (HOSPITAL_BASED_OUTPATIENT_CLINIC_OR_DEPARTMENT_OTHER): Admission: RE | Payer: Self-pay | Source: Home / Self Care

## 2021-12-01 ENCOUNTER — Ambulatory Visit (HOSPITAL_BASED_OUTPATIENT_CLINIC_OR_DEPARTMENT_OTHER): Admission: RE | Admit: 2021-12-01 | Payer: BC Managed Care – PPO | Source: Home / Self Care | Admitting: Urology

## 2021-12-01 SURGERY — LITHOTRIPSY, ESWL
Anesthesia: LOCAL | Laterality: Right

## 2021-12-17 ENCOUNTER — Encounter: Payer: Self-pay | Admitting: Family Medicine

## 2021-12-17 DIAGNOSIS — N200 Calculus of kidney: Secondary | ICD-10-CM | POA: Insufficient documentation

## 2022-04-01 ENCOUNTER — Encounter: Payer: Self-pay | Admitting: Internal Medicine

## 2022-04-16 ENCOUNTER — Ambulatory Visit: Payer: BC Managed Care – PPO | Admitting: Family Medicine

## 2022-04-16 ENCOUNTER — Encounter: Payer: Self-pay | Admitting: Family Medicine

## 2022-04-16 VITALS — BP 128/78 | HR 76 | Ht 68.5 in | Wt 176.2 lb

## 2022-04-16 DIAGNOSIS — Z23 Encounter for immunization: Secondary | ICD-10-CM

## 2022-04-16 DIAGNOSIS — M25511 Pain in right shoulder: Secondary | ICD-10-CM

## 2022-04-16 MED ORDER — MELOXICAM 15 MG PO TABS: 15.0000 mg | ORAL_TABLET | Freq: Every day | ORAL | 0 refills | Status: DC

## 2022-04-16 NOTE — Patient Instructions (Addendum)
  Please do the range of motion exercises at least twice daily. You can try topical medications such as Biofreeze, if needed.  Take the meloxicam once daily with food.  Cut the dose in half if it bothers your stomach. Continue your lansoprazole daily. Take it until your pain has resolved (or for the full prescription, if needed). Do not take any ibuprofen/advil/motrin/aleve/naproxen/goody or BC while taking this medication. You CAN take tylenol.  Return in 2 weeks if you aren't better.  Call us ahead of time and we can get an x-ray of the shoulder done prior to your visit (2 days before) Future options include cortisone injection, physical therapy, sports med or orthopedic evaluation.

## 2022-04-16 NOTE — Progress Notes (Signed)
Chief Complaint  Patient presents with   Shoulder Pain    Right shoulder pain x 3 months. Has not tried anything. Afraid to use ibuprofen with his blood pressure. Hurt when he raises his arm or grabs into back seat to grab something. Pain can shoot down his arm. Relation massage did not help.    Started with R shoulder pain in June.  No known injury, change in activity. Started shortly after returning from [place] (?related to luggage).  Hurts laterally at the R shoulder. Hurts with reaching overhead, stretching forward, reaching backwards. No pain with lifting, only with reaching.  Tried a topical lidocaine once, hurt to apply.  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 Denies numbness/tingling or weakness.  PMH, PSH, SH reviewed  Outpatient Encounter Medications as of 04/16/2022  Medication Sig Note   Choline Fenofibrate (FENOFIBRIC ACID) 135 MG CPDR Take 135 mg by mouth daily.    lansoprazole (PREVACID) 30 MG capsule Take 1 capsule (30 mg total) by mouth daily. To be taken 30 minutes before breakfast.    meloxicam (MOBIC) 15 MG tablet Take 1 tablet (15 mg total) by mouth daily. Take with food, daily until pain resolves    Multiple Vitamin (MULTIVITAMINS PO) Take by mouth daily.   04/15/2015: Takes daily    OMEGA 3 1000 MG CAPS Take 1,000-2,000 mg by mouth daily.  03/13/2019: Takes 2 daily   simethicone (MYLICON) 213 MG chewable tablet Chew 125 mg by mouth as needed. Reported on 11/26/2015 08/04/2021: Took one today   [DISCONTINUED] fenofibrate 160 MG tablet Take 1 tablet (160 mg total) by mouth daily.    [DISCONTINUED] HYDROmorphone (DILAUDID) 2 MG tablet Take 1 tablet (2 mg total) by mouth every 4 (four) hours as needed for severe pain.    [DISCONTINUED] ondansetron (ZOFRAN-ODT) 8 MG disintegrating tablet Take 1 tablet (8 mg total) by mouth every 8 (eight) hours as needed.    [DISCONTINUED] ranitidine (RANITIDINE 75) 75 MG tablet Take 75 mg by mouth 2 (two) times daily.       [DISCONTINUED] tamsulosin (FLOMAX) 0.4 MG CAPS capsule Take 1 capsule daily until stone passes.    No facility-administered encounter medications on file as of 04/16/2022.   NOT taking mobic prior to today  No Known Allergies  ROS: no f/c, URI symptoms, n/v/d, abdominal pain, numbness, tingling, weakness. No bleeding, bruising, rash. See HPI   PHYSICAL EXAM:  BP 128/78   Pulse 76   Ht 5' 8.5" (1.74 m)   Wt 176 lb 3.2 oz (79.9 kg)   BMI 26.40 kg/m   Well-appearing male in no distress RUE:  Tender anterior shoulder and slightly over lateral upper arm (upper deltoid). Slight decreased FF 170 degrees Limited abduction to 120 degrees Decreased internal rotation Pain reaching across to left shoulder, + impingement. Normal strength. No weakness. No significant pain with any RC/muscle testing (just subscapularis--position hurt to start, full strength) Neck: no spinal tenderness  ASSESSMENT/PLAN:  Right shoulder pain, unspecified chronicity - bursitis vs tendonitis. Trial of NSAID, risks/precautions reviewed.  ROM exercises shown. Next steps XR, injection vs PT vs sports med/ortho - Plan: meloxicam (MOBIC) 15 MG tablet  Need for influenza vaccination - Plan: Flu Vaccine QUAD 6+ mos PF IM (Fluarix Quad PF)  F/u 2 weeks if not better; to call to get x-ray 2 days prior to appt.   Please do the range of motion exercises at least twice daily. You can try topical medications such as Biofreeze, if needed.  Take the meloxicam once daily with food. Take it until your pain has resolved (or for the full prescription, if needed). Do not take any ibuprofen/advil/motrin/aleve/naproxen/goody or BC while taking this medication. You CAN take tylenol.  Return in 2 weeks if you aren't better.  Call us ahead of time and we can get an x-ray of the shoulder done prior to your visit (2 days before) Future options include cortisone injection, physical therapy, sports med or orthopedic  evaluation.

## 2022-04-20 ENCOUNTER — Encounter: Payer: Self-pay | Admitting: Family Medicine

## 2022-04-28 ENCOUNTER — Encounter: Payer: Self-pay | Admitting: Family Medicine

## 2022-05-22 ENCOUNTER — Other Ambulatory Visit (INDEPENDENT_AMBULATORY_CARE_PROVIDER_SITE_OTHER): Payer: BC Managed Care – PPO

## 2022-05-22 DIAGNOSIS — Z0000 Encounter for general adult medical examination without abnormal findings: Secondary | ICD-10-CM

## 2022-05-22 DIAGNOSIS — Z23 Encounter for immunization: Secondary | ICD-10-CM | POA: Diagnosis not present

## 2022-05-22 DIAGNOSIS — E781 Pure hyperglyceridemia: Secondary | ICD-10-CM

## 2022-05-22 DIAGNOSIS — K219 Gastro-esophageal reflux disease without esophagitis: Secondary | ICD-10-CM

## 2022-05-22 DIAGNOSIS — R7301 Impaired fasting glucose: Secondary | ICD-10-CM

## 2022-05-22 DIAGNOSIS — Z125 Encounter for screening for malignant neoplasm of prostate: Secondary | ICD-10-CM

## 2022-05-22 DIAGNOSIS — Z5181 Encounter for therapeutic drug level monitoring: Secondary | ICD-10-CM

## 2022-05-23 LAB — LIPID PANEL
Chol/HDL Ratio: 3.9 ratio (ref 0.0–5.0)
Cholesterol, Total: 143 mg/dL (ref 100–199)
HDL: 37 mg/dL — ABNORMAL LOW (ref >39)
LDL Chol Calc (NIH): 81 mg/dL (ref 0–99)
Triglycerides: 141 mg/dL (ref 0–149)
VLDL Cholesterol Cal: 25 mg/dL (ref 5–40)

## 2022-05-23 LAB — CBC WITH DIFFERENTIAL/PLATELET
Basophils Absolute: 0 10*3/uL (ref 0.0–0.2)
Basos: 1 %
EOS (ABSOLUTE): 0.2 10*3/uL (ref 0.0–0.4)
Eos: 3 %
Hematocrit: 42 % (ref 37.5–51.0)
Hemoglobin: 14.5 g/dL (ref 13.0–17.7)
Immature Grans (Abs): 0 10*3/uL (ref 0.0–0.1)
Immature Granulocytes: 0 %
Lymphocytes Absolute: 1.5 10*3/uL (ref 0.7–3.1)
Lymphs: 28 %
MCH: 30.7 pg (ref 26.6–33.0)
MCHC: 34.5 g/dL (ref 31.5–35.7)
MCV: 89 fL (ref 79–97)
Monocytes Absolute: 0.5 10*3/uL (ref 0.1–0.9)
Monocytes: 9 %
Neutrophils Absolute: 3.2 10*3/uL (ref 1.4–7.0)
Neutrophils: 59 %
Platelets: 232 10*3/uL (ref 150–450)
RBC: 4.72 x10E6/uL (ref 4.14–5.80)
RDW: 11.7 % (ref 11.6–15.4)
WBC: 5.4 10*3/uL (ref 3.4–10.8)

## 2022-05-23 LAB — COMPREHENSIVE METABOLIC PANEL
ALT: 37 IU/L (ref 0–44)
AST: 28 IU/L (ref 0–40)
Albumin/Globulin Ratio: 2.1 (ref 1.2–2.2)
Albumin: 4.8 g/dL (ref 3.8–4.9)
Alkaline Phosphatase: 52 IU/L (ref 44–121)
BUN/Creatinine Ratio: 14 (ref 9–20)
BUN: 16 mg/dL (ref 6–24)
Bilirubin Total: 0.5 mg/dL (ref 0.0–1.2)
CO2: 22 mmol/L (ref 20–29)
Calcium: 9.8 mg/dL (ref 8.7–10.2)
Chloride: 102 mmol/L (ref 96–106)
Creatinine, Ser: 1.13 mg/dL (ref 0.76–1.27)
Globulin, Total: 2.3 g/dL (ref 1.5–4.5)
Glucose: 101 mg/dL — ABNORMAL HIGH (ref 70–99)
Potassium: 4.9 mmol/L (ref 3.5–5.2)
Sodium: 140 mmol/L (ref 134–144)
Total Protein: 7.1 g/dL (ref 6.0–8.5)
eGFR: 79 mL/min/{1.73_m2} (ref >59)

## 2022-05-23 LAB — PSA: Prostate Specific Ag, Serum: 0.5 ng/mL (ref 0.0–4.0)

## 2022-05-26 NOTE — Patient Instructions (Incomplete)
  HEALTH MAINTENANCE RECOMMENDATIONS:  It is recommended that you get at least 30 minutes of aerobic exercise at least 5 days/week (for weight loss, you may need as much as 60-90 minutes). This can be any activity that gets your heart rate up. This can be divided in 10-15 minute intervals if needed, but try and build up your endurance at least once a week.  Weight bearing exercise is also recommended twice weekly.  Eat a healthy diet with lots of vegetables, fruits and fiber.  "Colorful" foods have a lot of vitamins (ie green vegetables, tomatoes, red peppers, etc).  Limit sweet tea, regular sodas and alcoholic beverages, all of which has a lot of calories and sugar.  Up to 2 alcoholic drinks daily may be beneficial for men (unless trying to lose weight, watch sugars).  Drink a lot of water.  Sunscreen of at least SPF 30 should be used on all sun-exposed parts of the skin when outside between the hours of 10 am and 4 pm (not just when at beach or pool, but even with exercise, golf, tennis, and yard work!)  Use a sunscreen that says "broad spectrum" so it covers both UVA and UVB rays, and make sure to reapply every 1-2 hours.  Remember to change the batteries in your smoke detectors when changing your clock times in the spring and fall.  Carbon monoxide detectors are recommended for your home.  Use your seat belt every time you are in a car, and please drive safely and not be distracted with cell phones and texting while driving.  Go to [facility] at [facility] to get your shoulder x-rays. We are refilling the meloxicam to see if an additional course helps. Depending on if you have persistent pain, and what your x-rays show, we will need to determine the next steps (?physical therapy vs sports med vs ortho referral).

## 2022-05-26 NOTE — Progress Notes (Unsigned)
No chief complaint on file.   Jordan Stafford is a 51 y.o. male who presents for a complete physical.  He had labs done prior to his visit, see below. He has the following concerns:  Seen 9/21 with 3 months of R shoulder pain. He was prescribed mobic, and advised to f/u if not better in 2 weeks.  He sent a message 10/3 stating ROM has improved significantly, still having pain reaching behind his back. He was advised of options--give it more time, extend NSAID, referral to ortho or sports med, physical therapy, or possible cortisone injection, if appropriate on re-eval.  History of elevated BP's, in office related to anxiety, sometimes related to poor diet.  Checks at home periodically, when feeling good his BP is 120's/80's (goes up if he continues to check, gets anxious). Tries to follow low sodium diet, but not always. BP Readings from Last 3 Encounters:  04/16/22 128/78  11/23/21 135/87  08/04/21 130/76   Hypertriglyceridemia: He is taking fenofibrate regularly without side effects (upsets his stomach if taken without food). He tries to follow a lowfat, low cholesterol diet.     Impaired fasting glucose:  He tries to limit his sugar and sweets.  Last year his fasting sugar was up when he had been eating more [employer] candy.  He no longer buys candy. A1c's have been normal.  GERD: Under the care of Dr. Hilarie Fredrickson.  He has been taking prevacid 17m before lunch.  He hasn't been using any pepcid in the evening, hasn't needed it. Denies dysphagia, wheezing. Denies heartburn.   H/o SVT: He has been off diltiazem for years (2013), with no recurrences of SVT.  Had normal stress test 08/2013.  He has had no further episodes of SVT since 2008. Released from care of cardiologist in 01/2014.    Immunization History  Administered Date(s) Administered   COVID-19, mRNA, vaccine(Comirnaty)12 years and older 05/22/2022   Influenza Split 05/27/2011   Influenza,inj,Quad PF,6+ Mos 04/11/2014, 04/15/2015,  04/15/2016, 04/12/2017, 03/29/2018, 04/10/2019, 05/09/2020, 05/15/2021, 04/16/2022   PFIZER(Purple Top)SARS-COV-2 Vaccination 08/03/2019, 08/24/2019, 04/25/2020, 12/09/2020   Pfizer Covid-19 Vaccine Bivalent Booster 158yr& up 05/15/2021   Tdap 07/25/2008, 04/14/2018   Varicella 05/18/2017, 06/22/2017   Last colonoscopy: 08/2019 with Dr. PyHilarie Fredrickson 2 polyps (tubular adenomas), small internal hemorrhoids.  F/u 7 years recommended   Last PSA:  Lab Results  Component Value Date   PSA1 0.5 05/22/2022   PSA1 0.4 05/23/2021   PSA1 0.5 05/21/2020  Dentist: twice yearly   Ophtho: once yearly   Exercise:    "not much"  Recently joined [employer]but then daughter got sick. Only going for her swim lessions, getting in the pool with her. (He doesn't swim, using noodle and kicking). +weight-bearing lifting daughter to play with her.  Walks fast between classes (<10 minutes).    PMH, PSH, SH and FH were reviewed and updated   ROS: The patient denies anorexia, fever, weight changes, headaches (occ tension or weather-related HA), vision loss, decreased hearing, ear pain, hoarseness, chest pain, dizziness, syncope, dyspnea on exertion, cough, swelling, nausea, vomiting, constipation, abdominal pain, melena, hematochezia, hematuria, incontinence, erectile dysfunction, nocturia, weakened urine stream, dysuria, genital lesions, joint pains, numbness, tingling, weakness, tremor, depression, abnormal bleeding/bruising, or enlarged lymph nodes   No recurrences of SVT Heartburn, depending on diet. Doing well overall on mid-day PPI per Dr. PyHilarie FredricksonTonsillar stones intermittently, easily to remove with his tongue. R shoulder pain   PHYSICAL EXAM:  There were  no vitals taken for this visit.  Wt Readings from Last 3 Encounters:  04/16/22 176 lb 3.2 oz (79.9 kg)  11/23/21 172 lb (78 kg)  08/04/21 172 lb 6.4 oz (78.2 kg)    General Appearance:   Alert, cooperative, no distress, appears stated age, in good  spirits  Head:   Normocephalic, without obvious abnormality, atraumatic    Eyes:   PERRL, conjunctiva/corneas clear, EOM's intact, fundi benign    Ears:   Normal TM's and external ear canals    Nose:   No drainage or sinus tenderness  Throat:   Normal mucosa  Neck:   Supple, no lymphadenopathy; thyroid: no enlargement/tenderness/ nodules; no carotid bruit or JVD    Back:   Spine nontender, no curvature, ROM normal, no CVA tenderness    Lungs:   Clear to auscultation bilaterally without wheezes, rales or ronchi; respirations unlabored    Chest Wall:   No tenderness or deformity    Heart:   Regular rate and rhythm, S1 and S2 normal, no murmur, rub or gallop    Breast Exam:  No chest wall tenderness, masses or gynecomastia    Abdomen:   Soft, non-tender, nondistended, normoactive bowel sounds, no masses, no hepatosplenomegaly    Genitalia:   Normal male external genitalia without lesions. Testicles without masses. No inguinal hernias.    Rectal:   Normal sphincter tone, no masses or tenderness; heme negative stool. Prostate smooth, no nodules, not enlarged.    Extremities:   No clubbing, cyanosis or edema  Pulses:   2+ and symmetric all extremities    Skin:   Skin color, texture, turgor normal.   Lymph nodes:   Cervical, supraclavicular, and inguinal nodes normal    Neurologic:   Normal strength, sensation and gait; reflexes 2+ and symmetric throughout                 Psych:  Normal mood, hygiene and grooming. Full range of affect   Lab Results  Component Value Date   CHOL 143 05/22/2022   HDL 37 (L) 05/22/2022   LDLCALC 81 05/22/2022   TRIG 141 05/22/2022   CHOLHDL 3.9 05/22/2022     Chemistry      Component Value Date/Time   NA 140 05/22/2022 0915   K 4.9 05/22/2022 0915   CL 102 05/22/2022 0915   CO2 22 05/22/2022 0915   BUN 16 05/22/2022 0915   CREATININE 1.13 05/22/2022 0915   CREATININE 1.07 04/22/2017 0754      Component Value Date/Time   CALCIUM 9.8 05/22/2022 0915    ALKPHOS 52 05/22/2022 0915   AST 28 05/22/2022 0915   ALT 37 05/22/2022 0915   BILITOT 0.5 05/22/2022 0915     Fasting glucose 101  Lab Results  Component Value Date   WBC 5.4 05/22/2022   HGB 14.5 05/22/2022   HCT 42.0 05/22/2022   MCV 89 05/22/2022   PLT 232 05/22/2022   Lab Results  Component Value Date   PSA1 0.5 05/22/2022   PSA1 0.4 05/23/2021   PSA1 0.5 05/21/2020     ASSESSMENT/PLAN:   Recommended at least 30 minutes of aerobic activity at least 5 days/week, weight-bearing exercise 2-3x/wk; proper sunscreen use reviewed; healthy diet and alcohol recommendations (less than or equal to 2 drinks/day) reviewed; regular seatbelt use; changing batteries in smoke detectors. Immunization recommendations discussed--UTD. Continue yearly flu shots. Discussed low risk for Shingrix (He is at lower risk than someone who had chicken pox, but not zero).  He had varicella vaccines in 2018  Colonoscopy recommendations reviewed--UTD, 7 year f/u recommended, 08/2026.   F/u 1 year with fasting labs prior, sooner prn. c-met, CBC, lipid, PSA ***ENTER FUTURE ORDERS

## 2022-05-27 ENCOUNTER — Ambulatory Visit: Payer: BC Managed Care – PPO | Admitting: Family Medicine

## 2022-05-27 ENCOUNTER — Encounter: Payer: Self-pay | Admitting: Family Medicine

## 2022-05-27 VITALS — BP 120/84 | HR 72 | Ht 69.0 in | Wt 177.4 lb

## 2022-05-27 DIAGNOSIS — Z0000 Encounter for general adult medical examination without abnormal findings: Secondary | ICD-10-CM | POA: Diagnosis not present

## 2022-05-27 DIAGNOSIS — Z5181 Encounter for therapeutic drug level monitoring: Secondary | ICD-10-CM

## 2022-05-27 DIAGNOSIS — E781 Pure hyperglyceridemia: Secondary | ICD-10-CM

## 2022-05-27 DIAGNOSIS — K219 Gastro-esophageal reflux disease without esophagitis: Secondary | ICD-10-CM

## 2022-05-27 DIAGNOSIS — R7301 Impaired fasting glucose: Secondary | ICD-10-CM | POA: Diagnosis not present

## 2022-05-27 DIAGNOSIS — J309 Allergic rhinitis, unspecified: Secondary | ICD-10-CM

## 2022-05-27 DIAGNOSIS — M25511 Pain in right shoulder: Secondary | ICD-10-CM

## 2022-05-27 DIAGNOSIS — Z125 Encounter for screening for malignant neoplasm of prostate: Secondary | ICD-10-CM

## 2022-05-27 LAB — POCT URINALYSIS DIP (PROADVANTAGE DEVICE)
Bilirubin, UA: NEGATIVE
Blood, UA: NEGATIVE
Glucose, UA: NEGATIVE mg/dL
Ketones, POC UA: NEGATIVE mg/dL
Leukocytes, UA: NEGATIVE
Nitrite, UA: NEGATIVE
Protein Ur, POC: NEGATIVE mg/dL
Specific Gravity, Urine: 1.01
Urobilinogen, Ur: 0.2
pH, UA: 6 (ref 5.0–8.0)

## 2022-05-27 MED ORDER — MELOXICAM 15 MG PO TABS: 15.0000 mg | ORAL_TABLET | Freq: Every day | ORAL | 0 refills | Status: DC

## 2022-05-27 MED ORDER — FENOFIBRIC ACID 135 MG PO CPDR: 135.0000 mg | DELAYED_RELEASE_CAPSULE | Freq: Every day | ORAL | 3 refills | Status: DC

## 2022-06-03 ENCOUNTER — Ambulatory Visit
Admission: RE | Admit: 2022-06-03 | Discharge: 2022-06-03 | Disposition: A | Discharge status: Home / Self Care | Payer: BC Managed Care – PPO | Source: Ambulatory Visit | Attending: Family Medicine | Admitting: Family Medicine

## 2022-06-03 DIAGNOSIS — M25511 Pain in right shoulder: Secondary | ICD-10-CM

## 2022-06-18 ENCOUNTER — Other Ambulatory Visit: Payer: Self-pay | Admitting: Nurse Practitioner

## 2022-06-24 ENCOUNTER — Ambulatory Visit (INDEPENDENT_AMBULATORY_CARE_PROVIDER_SITE_OTHER): Payer: BC Managed Care – PPO | Admitting: Family Medicine

## 2022-06-24 ENCOUNTER — Encounter: Payer: Self-pay | Admitting: Family Medicine

## 2022-06-24 VITALS — BP 130/78 | HR 72 | Temp 98.0°F | Ht 69.0 in | Wt 178.0 lb

## 2022-06-24 DIAGNOSIS — M25511 Pain in right shoulder: Secondary | ICD-10-CM | POA: Diagnosis not present

## 2022-06-24 DIAGNOSIS — R059 Cough, unspecified: Secondary | ICD-10-CM | POA: Diagnosis not present

## 2022-06-24 LAB — POC COVID19 BINAXNOW: SARS Coronavirus 2 Ag: NEGATIVE

## 2022-06-24 NOTE — Progress Notes (Signed)
Chief Complaint  Patient presents with   Cough    Cough almost 4 weeks. Worse if he bends over (like to tie shoes) or when he is exposed to cold. No other symptoms. Took some cough medicine for a while but stopped over the last few days. (Delsym)   11/1 had some hoarseness, which developed into a cough. Never had a lot of nasal congestion, just some. No sore throat. He has had very little nasal drainage, has been clear, sometimes thick yellow (only sees first thing in the morning).  He coughs more when he is cold (outside, opens the freezer), or if he bends down to tie his shoes. No coughing after eating. Not coughing with exercise. He denies any heartburn.  Cough is sometimes productive (clear or thick yellow)--very infrequent; cough can also be nonproductive. Has spasms of cough, but very infrequent. Spasms are random, but definitely notices it with cold air.  He tried zyrtec for 2-3 days, but it wasn't effective, so stopped it, as he wasn't having other allergy symptoms (no itchy eyes).  He also reports ongoing R shoulder pain., and is asking for sports med referral (as previously discussed).  ROM is much improved, but still has pain when reaching overhead. Pain seems very sporadic, no pain with bowling. Pain started in June/July, after last trip to [place]. Evaluated here in September, treated with NSAID. X-rays in November, normal   PMH, PSH, SH reviewed  Outpatient Encounter Medications as of 06/24/2022  Medication Sig Note   Choline Fenofibrate (FENOFIBRIC ACID) 135 MG CPDR Take 135 mg by mouth daily.    lansoprazole (PREVACID) 30 MG capsule TAKE 1 CAPSULE (30 MG TOTAL) BY MOUTH DAILY. TO BE TAKEN 30 MINUTES BEFORE BREAKFAST.    Multiple Vitamin (MULTIVITAMINS PO) Take by mouth daily.   04/15/2015: Takes daily    OMEGA 3 1000 MG CAPS Take 1,000-2,000 mg by mouth daily.  03/13/2019: Takes 2 daily   simethicone (MYLICON) 741 MG chewable tablet Chew 125 mg by mouth as needed. Reported  on 11/26/2015 06/24/2022: Took today   famotidine (PEPCID) 20 MG tablet Take 20 mg by mouth 2 (two) times daily. (Patient not taking: Reported on 05/27/2022) 05/27/2022: At night, if needed   [DISCONTINUED] fenofibrate 160 MG tablet Take 1 tablet (160 mg total) by mouth daily.    [DISCONTINUED] meloxicam (MOBIC) 15 MG tablet Take 1 tablet (15 mg total) by mouth daily. Take with food, daily until pain resolves    [DISCONTINUED] ranitidine (RANITIDINE 75) 75 MG tablet Take 75 mg by mouth 2 (two) times daily.      No facility-administered encounter medications on file as of 06/24/2022.   No Known Allergies  ROS: No fever, chills. No headaches or dizziness.  Denies sinus pain. No sore throat. Denies heartburn, indigestion (triggered more by stress than diet). Cough per HPI.  No shortness of breath with exertion. R shoulder pain per HPI.   PHYSICAL EXAM:  BP 130/78   Pulse 72   Temp 98 F (36.7 C) (Tympanic)   Ht '5\' 9"'$  (1.753 m)   Wt 178 lb (80.7 kg)   BMI 26.29 kg/m   HEENT: conjunctiva and sclera are clear. TM's and EAC's are normal. Nasal mucosa is moderately edematous with clear stringy mucus and some thicker whitish-light yellow adhering to the nasal wall. Sinuses are nontender. OP is clear Neck: No lymphadenopathy or mass Heart: regular rate and rhythm Lungs: clear, no wheezes, rales or ronchi. Neuro: alert and oriented, cranial nerves are grossly  intact, normal gait. Extremities: no edema.  ROM of shoulder improved Psych: normal mood, affect, hygiene and grooming  Rapid COVID negative   ASSESSMENT/PLAN:  Cough, unspecified type - Ddx reviewed, based on exam suspect related to allergic rhinitis/PND. Ddx includes RAD, GERD. Trial zyrtec and mucinex, +/-flonase. contact us if not better - Plan: POC COVID-19  Right shoulder pain, unspecified chronicity - improved ROM, and pain overall improved, but still having pain with certain movements/activities. Refer to sports med for  further eval - Plan: Ambulatory referral to Sports Medicine  Stay well hydrated.  Increase your water intake by another 1-2 eight ounce bottles daily, if possible. Restart zyrtec once daily. Also start a 12 hour plain Mucinex (regular or maximum strength), and take it twice daily. This should thin out the mucus that drains down the back of the throat and likely contributes to the cough.  The zyrtec should help dry up the mucus, so that less is draining. If you don't notice an improvement after 5-7 days, then add in a nasal steroid spray such as flonase.  Be sure to use gentle sniffs.

## 2022-06-24 NOTE — Patient Instructions (Signed)
Stay well hydrated.  Increase your water intake by another 1-2 eight ounce bottles daily, if possible. Restart zyrtec once daily. Also start a 12 hour plain Mucinex (regular or maximum strength), and take it twice daily. This should thin out the mucus that drains down the back of the throat and likely contributes to the cough.  The zyrtec should help dry up the mucus, so that less is draining. If you don't notice an improvement after 5-7 days, then add in a nasal steroid spray such as flonase.  Be sure to use gentle sniffs.

## 2022-06-29 ENCOUNTER — Ambulatory Visit: Payer: BC Managed Care – PPO | Admitting: Family Medicine

## 2022-06-29 ENCOUNTER — Ambulatory Visit: Payer: Self-pay

## 2022-06-29 ENCOUNTER — Encounter: Payer: Self-pay | Admitting: Family Medicine

## 2022-06-29 VITALS — BP 120/84 | Ht 69.0 in | Wt 175.0 lb

## 2022-06-29 DIAGNOSIS — M25511 Pain in right shoulder: Secondary | ICD-10-CM

## 2022-06-29 DIAGNOSIS — M7581 Other shoulder lesions, right shoulder: Secondary | ICD-10-CM | POA: Insufficient documentation

## 2022-06-29 DIAGNOSIS — M778 Other enthesopathies, not elsewhere classified: Secondary | ICD-10-CM | POA: Diagnosis not present

## 2022-06-29 MED ORDER — TRIAMCINOLONE ACETONIDE 40 MG/ML IJ SUSP
40.0000 mg | Freq: Once | INTRAMUSCULAR | Status: AC
  Administered 2022-06-29: 40 mg via INTRA_ARTICULAR

## 2022-06-29 NOTE — Progress Notes (Signed)
  [name] - 51 y.o. male MRN [id number]  Date of birth: [birth year]  SUBJECTIVE:  Including CC & ROS.  No chief complaint on file.   Jordan Stafford is a 51 y.o. male that is presenting with acute on chronic right shoulder pain.  Pain has been present since the summer.  Having trouble with flexion and reaching behind his back.  No history of similar pain no history of surgery.   Independent review of the right shoulder x-ray from 11/8 shows no acute changes.  Review of Systems See HPI   HISTORY: Past Medical, Surgical, Social, and Family History Reviewed & Updated per EMR.   Pertinent Historical Findings include:  Past Medical History:  Diagnosis Date   Acne    Anxiety    GERD (gastroesophageal reflux disease)    Hx of cardiovascular stress test    ETT-Myoview (08/2013):  No ischemia, EF 67%; normal study.   IBS (irritable bowel syndrome)    Renal stone    add'l stone 11/2021, identified as calcium oxalate   Seasonal allergies    Supraventricular tachycardia    Dr Caryl Comes    Past Surgical History:  Procedure Laterality Date   MOUTH SURGERY     x3   TOENAIL EXCISION     ingrown nail     PHYSICAL EXAM:  VS: BP 120/84   Ht '5\' 9"'$  (1.753 m)   Wt 175 lb (79.4 kg)   BMI 25.84 kg/m  Physical Exam Gen: NAD, alert, cooperative with exam, well-appearing MSK:  Neurovascularly intact    Limited ultrasound: Right shoulder pain:  Thickening appreciated at the superior portion of biceps tendon. No changes in the subscapularis. Normal-appearing supraspinatus.  Summary: Findings most consistent with a capsulitis.  Ultrasound and interpretation by Clearance Coots, MD  Aspiration/Injection Procedure Note Jordan Stafford February 06, 1971  Procedure: Injection Indications: Right shoulder pain  Procedure Details Consent: Risks of procedure as well as the alternatives and risks of each were explained to the (patient/caregiver).  Consent for procedure obtained. Time Out: Verified  patient identification, verified procedure, site/side was marked, verified correct patient position, special equipment/implants available, medications/allergies/relevent history reviewed, required imaging and test results available.  Performed.  The area was cleaned with iodine and alcohol swabs.    The right glenohumeral joint was injected using 3 cc of 1% lidocaine on a 22-gauge 3-1/2 inch needle.  The syringe was switched to mixture containing 1 cc's of 40 mg Kenalog and 4 cc's of 0.25% bupivacaine was injected.  Ultrasound was used. Images were obtained in short views showing the injection.     A sterile dressing was applied.  Patient did tolerate procedure well.     ASSESSMENT & PLAN:   Capsulitis of right shoulder Acute on chronic in nature.  Symptoms more consistent with capsulitis given the limitations in his range of motion. -Counseled on home exercise therapy and supportive care. -Injection today. -Could consider further imaging or physical therapy.

## 2022-06-29 NOTE — Assessment & Plan Note (Signed)
Acute on chronic in nature.  Symptoms more consistent with capsulitis given the limitations in his range of motion. -Counseled on home exercise therapy and supportive care. -Injection today. -Could consider further imaging or physical therapy.

## 2022-06-29 NOTE — Patient Instructions (Signed)
Nice to meet you Please try heat before exercise and ice after Please try the exercises   Please send me a message in MyChart with any questions or updates.  Please see me back in 4 weeks.   --Dr. Jolynda Townley  

## 2022-07-15 ENCOUNTER — Other Ambulatory Visit: Payer: Self-pay | Admitting: Nurse Practitioner

## 2022-07-31 ENCOUNTER — Encounter: Payer: Self-pay | Admitting: Family Medicine

## 2022-07-31 ENCOUNTER — Ambulatory Visit: Payer: BC Managed Care – PPO | Admitting: Family Medicine

## 2022-07-31 VITALS — BP 126/70 | Ht 69.0 in | Wt 180.0 lb

## 2022-07-31 DIAGNOSIS — M778 Other enthesopathies, not elsewhere classified: Secondary | ICD-10-CM | POA: Diagnosis not present

## 2022-07-31 NOTE — Patient Instructions (Signed)
Good to see you Please try heat before exercise and ice after  Please continue the exercises   Please send me a message in MyChart with any questions or updates.  Please see me back as needed.   --Dr. Raeford Razor

## 2022-07-31 NOTE — Progress Notes (Signed)
  DASANI CREAR - 52 y.o. male MRN [id number]  Date of birth: 1971/02/26  SUBJECTIVE:  Including CC & ROS.  No chief complaint on file.   Jordan Stafford is a 52 y.o. male that is following up for his right shoulder pain.  He has been doing well since the injection with near normal range of motion.  The pain has ceased.   Review of Systems See HPI   HISTORY: Past Medical, Surgical, Social, and Family History Reviewed & Updated per EMR.   Pertinent Historical Findings include:  Past Medical History:  Diagnosis Date   Acne    Anxiety    GERD (gastroesophageal reflux disease)    Hx of cardiovascular stress test    ETT-Myoview (08/2013):  No ischemia, EF 67%; normal study.   IBS (irritable bowel syndrome)    Renal stone    add'l stone 11/2021, identified as calcium oxalate   Seasonal allergies    Supraventricular tachycardia    Dr Caryl Comes    Past Surgical History:  Procedure Laterality Date   MOUTH SURGERY     x3   TOENAIL EXCISION     ingrown nail     PHYSICAL EXAM:  VS: BP 126/70   Ht '5\' 9"'$  (1.753 m)   Wt 180 lb (81.6 kg)   BMI 26.58 kg/m  Physical Exam Gen: NAD, alert, cooperative with exam, well-appearing MSK:  Neurovascularly intact       ASSESSMENT & PLAN:   Capsulitis of right shoulder Doing well since the injection.  Reports no pain today.  Has near normal range of motion. -Counseled on home exercise therapy and supportive care. -Could consider physical therapy.

## 2022-07-31 NOTE — Assessment & Plan Note (Signed)
Doing well since the injection.  Reports no pain today.  Has near normal range of motion. -Counseled on home exercise therapy and supportive care. -Could consider physical therapy.

## 2022-08-31 ENCOUNTER — Encounter: Payer: Self-pay | Admitting: Internal Medicine

## 2022-08-31 ENCOUNTER — Ambulatory Visit: Payer: BC Managed Care – PPO | Admitting: Internal Medicine

## 2022-08-31 VITALS — BP 128/72 | HR 95 | Ht 68.5 in | Wt 181.0 lb

## 2022-08-31 DIAGNOSIS — K219 Gastro-esophageal reflux disease without esophagitis: Secondary | ICD-10-CM | POA: Diagnosis not present

## 2022-08-31 MED ORDER — LANSOPRAZOLE 30 MG PO CPDR: 30.0000 mg | DELAYED_RELEASE_CAPSULE | Freq: Every day | ORAL | 3 refills | Status: DC

## 2022-08-31 NOTE — Progress Notes (Signed)
Subjective:    Patient ID: Jordan Stafford, male    DOB: 1970/08/08, 52 y.o.   MRN: [id number]  HPI Jordan Stafford is a hide morning last seen 06/05/2021 by Carl Best, NP.  He is here alone today.  He reports that he is doing well.  He continues lansoprazole 30 mg daily.  He will occasionally have breakthrough traditional heartburn type symptoms.  This seems to be worse during periods of stress.  It is infrequent.  Certainly less than every few weeks.  He takes famotidine with improvement on these occasions.  Occasionally if he eats late or a heavier meal he will take 10 mg of famotidine preemptively.  With or separate from this he can feel tingling discomfort in his left chest which he relates to gas pain.  This is not exertional and does not radiate.  Not associated with dyspnea.  Simethicone helps this.  No bowel complaint.   Review of Systems As per HPI, otherwise negative  Current Medications, Allergies, Past Medical History, Past Surgical History, Family History and Social History were reviewed in [employer] record.    Objective:   Physical Exam Ht 5' 8.5" (1.74 m)   Wt 181 lb (82.1 kg)   BMI 27.12 kg/m  Gen: awake, alert, NAD HEENT: anicteric CV: RRR, no mrg Pulm: CTA b/l Abd: soft, NT/ND, +BS throughout Ext: no c/c/e Neuro: nonfocal  CBC    Component Value Date/Time   WBC 5.4 05/22/2022 0915   WBC 5.1 04/22/2017 0754   RBC 4.72 05/22/2022 0915   RBC 4.83 04/22/2017 0754   HGB 14.5 05/22/2022 0915   HCT 42.0 05/22/2022 0915   PLT 232 05/22/2022 0915   MCV 89 05/22/2022 0915   MCH 30.7 05/22/2022 0915   MCH 30.4 04/22/2017 0754   MCHC 34.5 05/22/2022 0915   MCHC 34.8 04/22/2017 0754   RDW 11.7 05/22/2022 0915   LYMPHSABS 1.5 05/22/2022 0915   MONOABS 0.6 04/10/2014 1602   EOSABS 0.2 05/22/2022 0915   BASOSABS 0.0 05/22/2022 0915   CMP     Component Value Date/Time   NA 140 05/22/2022 0915   K 4.9 05/22/2022 0915   CL 102  05/22/2022 0915   CO2 22 05/22/2022 0915   GLUCOSE 101 (H) 05/22/2022 0915   GLUCOSE 109 (H) 04/22/2017 0754   BUN 16 05/22/2022 0915   CREATININE 1.13 05/22/2022 0915   CREATININE 1.07 04/22/2017 0754   CALCIUM 9.8 05/22/2022 0915   PROT 7.1 05/22/2022 0915   ALBUMIN 4.8 05/22/2022 0915   AST 28 05/22/2022 0915   ALT 37 05/22/2022 0915   ALKPHOS 52 05/22/2022 0915   BILITOT 0.5 05/22/2022 0915   GFRNONAA 89 05/21/2020 0830   GFRAA 103 05/21/2020 0830        Assessment & Plan:  52 year old male with a history of GERD and adenomatous colon polyp who is here for follow-up.  GERD --minor breakthrough symptoms relieved by as needed famotidine.  Lansoprazole 30 mg daily working well.  Continue current therapy -- Lansoprazole 30 mg daily -- Famotidine to be used 10 to 20 mg every 12 hours as needed for breakthrough symptoms -- EGD 3 years ago without Barrett's  2. Hx of nonadvanced adenomas --last colonoscopy February 2022; surveillance colonoscopy recommended February 2029  Follow-up in 1 to 2 years, sooner if needed  20 minutes total spent today including patient facing time, coordination of care, reviewing medical history/procedures/pertinent radiology studies, and documentation of the encounter.

## 2022-08-31 NOTE — Patient Instructions (Addendum)
If you are age 53 or younger, your body mass index should be between 19-25. Your Body mass index is 27.12 kg/m. If this is out of the aformentioned range listed, please consider follow up with your Primary Care Provider.  ________________________________________________________  The [facility] providers would like to encourage you to use [facility] to communicate with providers for non-urgent requests or questions.  Due to long hold times on the telephone, sending your provider a message by [facility] may be a faster and more efficient way to get a response.  Please allow 48 business hours for a response.  Please remember that this is for non-urgent requests.  _______________________________________________________  We have sent the following medications to your pharmacy for you to pick up at your convenience:  CONTINUE:lansoprazole '30mg'$  daily.  Please purchase the following medications over the counter and take as directed:  You can use famotidine '10mg'$  to '20mg'$  as needed for break through symptoms.  You will need a follow up appointment  in 1 to 2 years.   Thank you for entrusting me with your care and choosing [facility].  Dr Hilarie Fredrickson

## 2022-09-10 ENCOUNTER — Encounter: Payer: Self-pay | Admitting: Family Medicine

## 2022-11-09 ENCOUNTER — Encounter: Payer: Self-pay | Admitting: *Deleted

## 2022-12-24 ENCOUNTER — Encounter: Payer: Self-pay | Admitting: Family Medicine

## 2023-01-31 ENCOUNTER — Encounter: Payer: Self-pay | Admitting: Family Medicine

## 2023-03-30 ENCOUNTER — Ambulatory Visit: Payer: BC Managed Care – PPO | Admitting: Medical

## 2023-03-30 VITALS — BP 138/80 | HR 89 | Temp 98.3°F | Wt 187.2 lb

## 2023-03-30 DIAGNOSIS — R051 Acute cough: Secondary | ICD-10-CM

## 2023-03-30 MED ORDER — BENZONATATE 200 MG PO CAPS: 200.0000 mg | ORAL_CAPSULE | Freq: Three times a day (TID) | ORAL | 0 refills | Status: DC | PRN

## 2023-03-30 NOTE — Progress Notes (Signed)
Subjective:  Jordan Stafford is a 53 y.o. male who presents for Chief Complaint  Patient presents with   Cough    Cough x 3 weeks. Laying down or reclining coughing will flare up     Here for ongoing cough about 3 weeks.   Started before school started back.  Was getting more intermittent, less, then started back more frequently.  At times gets coughing fits.  Feels some drainage in back of throat.  Denies illness when this started but had some fatigue 3 weeks ago.  Never had fever or illness.   Wondered about allergies, tried allergy medicaiton for a while but that didn't seem to help.   Used some cough medicaiton this past week, which helps during the window of 12 hours per box directions, but cough still persists.    Has some sneezing.  Some irritated eyes at times, no itching of eyes.  In past doesn't always get typical symptoms of diagnosis such as sinus infection doesn't always have sinus pain and pressure.  Currently using cough syrup OTC at night, delsym.  Nonsmoker, no hx/o asthma.   Has 2 dogs at home, no prior dog allergy.  No other aggravating or relieving factors.    No other c/o.  Past Medical History:  Diagnosis Date   Acne    Anxiety    GERD (gastroesophageal reflux disease)    Hx of cardiovascular stress test    ETT-Myoview (08/2013):  No ischemia, EF 67%; normal study.   IBS (irritable bowel syndrome)    Renal stone    add'l stone 11/2021, identified as calcium oxalate   Seasonal allergies    Supraventricular tachycardia    Dr Graciela Husbands   Current Outpatient Medications on File Prior to Visit  Medication Sig Dispense Refill   famotidine (PEPCID) 20 MG tablet Take 20 mg by mouth 2 (two) times daily.     lansoprazole (PREVACID) 30 MG capsule Take 1 capsule (30 mg total) by mouth daily. To be taken 30 minutes before breakfast. 90 capsule 3   Multiple Vitamin (MULTIVITAMINS PO) Take by mouth daily.       OMEGA 3 1000 MG CAPS Take 1,000-2,000 mg by mouth daily.       simethicone (MYLICON) 125 MG chewable tablet Chew 125 mg by mouth as needed. Reported on 11/26/2015     Choline Fenofibrate (FENOFIBRIC ACID) 135 MG CPDR Take 135 mg by mouth daily. 90 capsule 3   [DISCONTINUED] fenofibrate 160 MG tablet Take 1 tablet (160 mg total) by mouth daily. 90 tablet 3   [DISCONTINUED] ranitidine (RANITIDINE 75) 75 MG tablet Take 75 mg by mouth 2 (two) times daily.       No current facility-administered medications on file prior to visit.     The following portions of the patient's history were reviewed and updated as appropriate: allergies, current medications, past family history, past medical history, past social history, past surgical history and problem list.  ROS Otherwise as in subjective above    Objective: BP 138/80   Pulse 89   Temp 98.3 F (36.8 C)   Wt 187 lb 3.2 oz (84.9 kg)   SpO2 98%   BMI 28.05 kg/m   General appearance: alert, no distress, well developed, well nourished HEENT: normocephalic, sclerae anicteric, conjunctiva pink and moist, TMs pearly, nares patent, no discharge or erythema, pharynx normal Oral cavity: MMM, no lesions Neck: supple, no lymphadenopathy, no thyromegaly, no masses Heart: RRR, normal S1, S2, no murmurs Lungs: CTA bilaterally,  no wheezes, rhonchi, or rales Pulses: 2+ radial pulses, 2+ pedal pulses, normal cap refill Ext: no edema   Assessment: Encounter Diagnosis  Name Primary?   Acute cough Yes     Plan: Exam findings are unremarkable.  Likely has some postnasal drainage.  We are entering the fall allergy season.  Advised to use allergy pill such as Zyrtec nightly for the next 1 to 2 weeks and begin [employer] as needed.  If not much improved within the next week let me know.  Advised if cough lingers we may consider chest x-ray baseline.  Jordan "Jordan Stafford" was seen today for cough.  Diagnoses and all orders for this visit:  Acute cough  Other orders -     benzonatate (TESSALON) 200 MG capsule; Take 1  capsule (200 mg total) by mouth 3 (three) times daily as needed for cough.    Follow up: prn

## 2023-04-20 NOTE — Progress Notes (Unsigned)
No chief complaint on file.   He saw Vincenza Hews on 9/3 with c/o 3 weeks of cough. Per Shane's note:  Started before school started back.  Was getting more intermittent, less, then started back more frequently.  At times gets coughing fits.  Feels some drainage in back of throat.  Denies illness when this started but had some fatigue 3 weeks ago.  Never had fever or illness. He tried allergy meds for a while didn't help. Cough meds helped temporarily.   Exam findings are unremarkable. Likely has some postnasal drainage. We are entering the fall allergy season. Advised to use allergy pill such as Zyrtec nightly for the next 1 to 2 weeks and begin [employer] as needed. If not much improved within the next week let me know. Advised if cough lingers we may consider chest x-ray baseline.      PMH, PSH, SH reviewed   ROS:    PHYSICAL EXAM:  There were no vitals taken for this visit.      ASSESSMENT/PLAN:

## 2023-04-21 ENCOUNTER — Encounter: Payer: Self-pay | Admitting: Family Medicine

## 2023-04-21 ENCOUNTER — Ambulatory Visit: Payer: BC Managed Care – PPO | Admitting: Family Medicine

## 2023-04-21 VITALS — BP 130/80 | HR 72 | Temp 98.9°F | Ht 68.5 in | Wt 185.0 lb

## 2023-04-21 DIAGNOSIS — J302 Other seasonal allergic rhinitis: Secondary | ICD-10-CM | POA: Diagnosis not present

## 2023-04-21 DIAGNOSIS — Z23 Encounter for immunization: Secondary | ICD-10-CM | POA: Diagnosis not present

## 2023-04-21 DIAGNOSIS — R052 Subacute cough: Secondary | ICD-10-CM | POA: Diagnosis not present

## 2023-04-21 MED ORDER — FLUTICASONE PROPIONATE 50 MCG/ACT NA SUSP
2.0000 | Freq: Every day | NASAL | 6 refills | Status: DC
Start: 2023-04-21 — End: 2024-06-06

## 2023-04-21 NOTE — Patient Instructions (Signed)
Drink plenty of water. Continue the daily zyrtec. Start Flonase--2 gentle sniffs into each nostril, once daily.  It an take up to 2 weeks to see the full effect.  Some people's allergies are well controlled with the flonase alone (and can stop the zyrtec), other people require both.  You can try cutting back on the zyrtec if/when you're feeling back to baseline, and using the zyrtec as needed. I would use Mucinex DM for the next few days. You can continue the benzonatate as needed. I recommend sinus rinses once or twice daily to help with sinus pressure (and to potentially prevent an infection).  Contact us if you develop worsening sinus pain, persistently discolored mucus or phlegm. This could indicate an infection that needs an antibiotic.

## 2023-05-05 ENCOUNTER — Encounter: Payer: Self-pay | Admitting: Family Medicine

## 2023-05-05 MED ORDER — AMOXICILLIN-POT CLAVULANATE 875-125 MG PO TABS: 1.0000 | ORAL_TABLET | Freq: Two times a day (BID) | ORAL | 0 refills | Status: DC

## 2023-05-17 MED ORDER — BENZONATATE 200 MG PO CAPS: 200.0000 mg | ORAL_CAPSULE | Freq: Three times a day (TID) | ORAL | 0 refills | Status: DC | PRN

## 2023-05-17 NOTE — Addendum Note (Signed)
Addended by: Joselyn Arrow on: 05/17/2023 12:28 PM   Modules accepted: Orders

## 2023-05-26 ENCOUNTER — Other Ambulatory Visit: Payer: BC Managed Care – PPO

## 2023-05-26 DIAGNOSIS — E781 Pure hyperglyceridemia: Secondary | ICD-10-CM

## 2023-05-26 DIAGNOSIS — Z5181 Encounter for therapeutic drug level monitoring: Secondary | ICD-10-CM

## 2023-05-26 DIAGNOSIS — Z0000 Encounter for general adult medical examination without abnormal findings: Secondary | ICD-10-CM

## 2023-05-26 DIAGNOSIS — Z125 Encounter for screening for malignant neoplasm of prostate: Secondary | ICD-10-CM

## 2023-05-27 LAB — CBC WITH DIFFERENTIAL/PLATELET
Basophils Absolute: 0 10*3/uL (ref 0.0–0.2)
Basos: 0 %
EOS (ABSOLUTE): 0.2 10*3/uL (ref 0.0–0.4)
Eos: 3 %
Hematocrit: 44.5 % (ref 37.5–51.0)
Hemoglobin: 14.4 g/dL (ref 13.0–17.7)
Immature Grans (Abs): 0 10*3/uL (ref 0.0–0.1)
Immature Granulocytes: 0 %
Lymphocytes Absolute: 1.7 10*3/uL (ref 0.7–3.1)
Lymphs: 33 %
MCH: 30 pg (ref 26.6–33.0)
MCHC: 32.4 g/dL (ref 31.5–35.7)
MCV: 93 fL (ref 79–97)
Monocytes Absolute: 0.6 10*3/uL (ref 0.1–0.9)
Monocytes: 11 %
Neutrophils Absolute: 2.7 10*3/uL (ref 1.4–7.0)
Neutrophils: 53 %
Platelets: 227 10*3/uL (ref 150–450)
RBC: 4.8 x10E6/uL (ref 4.14–5.80)
RDW: 12.5 % (ref 11.6–15.4)
WBC: 5.2 10*3/uL (ref 3.4–10.8)

## 2023-05-27 LAB — COMPREHENSIVE METABOLIC PANEL
ALT: 46 IU/L — ABNORMAL HIGH (ref 0–44)
AST: 41 IU/L — ABNORMAL HIGH (ref 0–40)
Albumin: 4.7 g/dL (ref 3.8–4.9)
Alkaline Phosphatase: 55 IU/L (ref 44–121)
BUN/Creatinine Ratio: 15 (ref 9–20)
BUN: 18 mg/dL (ref 6–24)
Bilirubin Total: 0.4 mg/dL (ref 0.0–1.2)
CO2: 20 mmol/L (ref 20–29)
Calcium: 9.9 mg/dL (ref 8.7–10.2)
Chloride: 102 mmol/L (ref 96–106)
Creatinine, Ser: 1.22 mg/dL (ref 0.76–1.27)
Globulin, Total: 2.7 g/dL (ref 1.5–4.5)
Glucose: 102 mg/dL — ABNORMAL HIGH (ref 70–99)
Potassium: 5.2 mmol/L (ref 3.5–5.2)
Sodium: 141 mmol/L (ref 134–144)
Total Protein: 7.4 g/dL (ref 6.0–8.5)
eGFR: 71 mL/min/{1.73_m2} (ref >59)

## 2023-05-27 LAB — LIPID PANEL
Chol/HDL Ratio: 4.1 ratio (ref 0.0–5.0)
Cholesterol, Total: 154 mg/dL (ref 100–199)
HDL: 38 mg/dL — ABNORMAL LOW (ref >39)
LDL Chol Calc (NIH): 91 mg/dL (ref 0–99)
Triglycerides: 142 mg/dL (ref 0–149)
VLDL Cholesterol Cal: 25 mg/dL (ref 5–40)

## 2023-05-27 LAB — PSA: Prostate Specific Ag, Serum: 0.4 ng/mL (ref 0.0–4.0)

## 2023-05-29 NOTE — Patient Instructions (Incomplete)

## 2023-05-29 NOTE — Progress Notes (Unsigned)
No chief complaint on file.  Jordan Stafford is a 52 y.o. male who presents for a complete physical.  He had labs done prior to his visit, see below.  Recently seen for cough.  He had been coughing since August. It was felt to be related to allergies, and allergy treatments were recommended.  He was treated with a course of Augmentin when the phlegm became discolored. This improved, but had some intermittent residual cough.  Today he reports   History of elevated BP's, in office related to anxiety, sometimes related to poor diet.   Checks at home periodically ***UPDATE  Tries to follow low sodium diet, but not always.   BP Readings from Last 3 Encounters:  04/21/23 130/80  03/30/23 138/80  08/31/22 128/72   Hypertriglyceridemia: He is taking fenofibrate regularly without side effects (upsets his stomach if taken without food). He tries to follow a lowfat, low cholesterol diet.     Impaired fasting glucose:  He tries to limit his sugar and sweets.   Isn't always good about his carbs or snacking at night.  GERD: Under the care of Dr. Rhea Belton.  He has been taking prevacid 30mg  before lunch.    Famotidine in the evening??  Denies dysphagia, wheezing. Denies heartburn.   H/o SVT: He has been off diltiazem for years (2013), with no recurrences of SVT.  Had normal stress test 08/2013.  He has had no further episodes of SVT since 2008. Released from care of cardiologist in 01/2014.     Immunization History  Administered Date(s) Administered   Influenza Split 05/27/2011   Influenza, Seasonal, Injecte, Preservative Fre 04/21/2023   Influenza,inj,Quad PF,6+ Mos 04/11/2014, 04/15/2015, 04/15/2016, 04/12/2017, 03/29/2018, 04/10/2019, 05/09/2020, 05/15/2021, 04/16/2022   PFIZER(Purple Top)SARS-COV-2 Vaccination 08/03/2019, 08/24/2019, 04/25/2020, 12/09/2020   Pfizer Covid-19 Vaccine Bivalent Booster 43yrs & up 05/15/2021   Pfizer(Comirnaty)Fall Seasonal Vaccine 12 years and older 05/22/2022,  04/21/2023   Tdap 07/25/2008, 04/14/2018   Varicella 05/18/2017, 06/22/2017   Last colonoscopy: 08/2019 with Dr. Rhea Belton.  2 polyps (tubular adenomas), small internal hemorrhoids.  F/u 7 years recommended   Last PSA:  Lab Results  Component Value Date   PSA1 0.4 05/26/2023   PSA1 0.5 05/22/2022   PSA1 0.4 05/23/2021  Dentist: twice yearly   Ophtho: once yearly   Exercise:     Walks a lot at work (walks laps in the building frequently), about 20 minutes 5 days/week. Picks daughter up to hug her in the mornings, otherwise not lifting much, unless related to projects (lifting boxes). No longer goes to the [facility] (still belongs).   PMH, PSH, SH and FH were reviewed and updated    ROS: The patient denies anorexia, fever, weight changes, headaches (occ tension or weather-related HA), vision loss, decreased hearing, ear pain, hoarseness, chest pain, dizziness, syncope, dyspnea on exertion, cough, swelling, nausea, vomiting, constipation, abdominal pain, melena, hematochezia, hematuria, incontinence, erectile dysfunction, nocturia, weakened urine stream, dysuria, genital lesions, joint pains, numbness, tingling, weakness, tremor, depression, abnormal bleeding/bruising, or enlarged lymph nodes    Heartburn, depending on diet and stress. Doing well overall on mid-day PPI per Dr. Rhea Belton. Tonsillar stones intermittently, easily to remove with his tongue. cough  PHYSICAL EXAM:  There were no vitals taken for this visit.  Wt Readings from Last 3 Encounters:  04/21/23 185 lb (83.9 kg)  03/30/23 187 lb 3.2 oz (84.9 kg)  08/31/22 181 lb (82.1 kg)    General Appearance:   Alert, cooperative, no distress, appears stated age, in  good spirits. Sounds hoarse today.  Head:   Normocephalic, without obvious abnormality, atraumatic    Eyes:   PERRL, conjunctiva/corneas clear, EOM's intact, fundi benign    Ears:   Normal TM's and external ear canals    Nose:   No drainage or sinus tenderness  Throat:    Normal mucosa  Neck:   Supple, no lymphadenopathy; thyroid: no enlargement/tenderness/ nodules; no carotid bruit or JVD    Back:   Spine nontender, no curvature, ROM normal, no CVA tenderness    Lungs:   Clear to auscultation bilaterally without wheezes, rales or ronchi; respirations unlabored    Chest Wall:   No tenderness or deformity    Heart:   Regular rate and rhythm, S1 and S2 normal, no murmur, rub or gallop    Breast Exam:  No chest wall tenderness, masses or gynecomastia    Abdomen:   Soft, non-tender, nondistended, normoactive bowel sounds, no masses, no hepatosplenomegaly    Genitalia:   Normal male external genitalia without lesions. Testicles without masses. No inguinal hernias.    Rectal:   Normal sphincter tone, no masses or tenderness; heme negative stool. Prostate smooth, no nodules, not enlarged.    Extremities:   No clubbing, cyanosis or edema.   Pulses:   2+ and symmetric all extremities    Skin:   Skin color, texture, turgor normal.   Lymph nodes:   Cervical, supraclavicular, and inguinal nodes normal    Neurologic:   Normal strength, sensation and gait; reflexes 2+ and symmetric throughout                 Psych:  Normal mood, hygiene and grooming. Full range of affect    Lab Results  Component Value Date   CHOL 154 05/26/2023   HDL 38 (L) 05/26/2023   LDLCALC 91 05/26/2023   TRIG 142 05/26/2023   CHOLHDL 4.1 05/26/2023     Chemistry      Component Value Date/Time   NA 141 05/26/2023 0832   K 5.2 05/26/2023 0832   CL 102 05/26/2023 0832   CO2 20 05/26/2023 0832   BUN 18 05/26/2023 0832   CREATININE 1.22 05/26/2023 0832   CREATININE 1.07 04/22/2017 0754      Component Value Date/Time   CALCIUM 9.9 05/26/2023 0832   ALKPHOS 55 05/26/2023 0832   AST 41 (H) 05/26/2023 0832   ALT 46 (H) 05/26/2023 0832   BILITOT 0.4 05/26/2023 0832     Fasting glucose 102  Lab Results  Component Value Date   WBC 5.2 05/26/2023   HGB 14.4 05/26/2023   HCT 44.5  05/26/2023   MCV 93 05/26/2023   PLT 227 05/26/2023   Lab Results  Component Value Date   PSA1 0.4 05/26/2023   PSA1 0.5 05/22/2022   PSA1 0.4 05/23/2021     ASSESSMENT/PLAN:   Recommended at least 30 minutes of aerobic activity at least 5 days/week, weight-bearing exercise 2-3x/wk; proper sunscreen use reviewed; healthy diet and alcohol recommendations (less than or equal to 2 drinks/day) reviewed; regular seatbelt use; changing batteries in smoke detectors. Immunization recommendations discussed--UTD. Continue yearly flu shots. Discussed that he is low risk for Shingrix (He is at lower risk than someone who had chicken pox, but not zero).  He had varicella vaccines in 2018  Colonoscopy recommendations reviewed--UTD, 7 year f/u recommended, 08/2026.   F/u 1 year with fasting labs prior, sooner prn. c-met, CBC, lipid, PSA  ***ENTER FUTURE ORDERS

## 2023-05-31 ENCOUNTER — Ambulatory Visit: Payer: BC Managed Care – PPO | Admitting: Family Medicine

## 2023-05-31 ENCOUNTER — Encounter: Payer: Self-pay | Admitting: Family Medicine

## 2023-05-31 VITALS — BP 136/70 | HR 76 | Ht 68.5 in | Wt 186.6 lb

## 2023-05-31 DIAGNOSIS — R7301 Impaired fasting glucose: Secondary | ICD-10-CM | POA: Diagnosis not present

## 2023-05-31 DIAGNOSIS — E781 Pure hyperglyceridemia: Secondary | ICD-10-CM

## 2023-05-31 DIAGNOSIS — R053 Chronic cough: Secondary | ICD-10-CM | POA: Diagnosis not present

## 2023-05-31 DIAGNOSIS — Z6827 Body mass index (BMI) 27.0-27.9, adult: Secondary | ICD-10-CM

## 2023-05-31 DIAGNOSIS — R7989 Other specified abnormal findings of blood chemistry: Secondary | ICD-10-CM

## 2023-05-31 DIAGNOSIS — Z0000 Encounter for general adult medical examination without abnormal findings: Secondary | ICD-10-CM | POA: Diagnosis not present

## 2023-05-31 DIAGNOSIS — J302 Other seasonal allergic rhinitis: Secondary | ICD-10-CM | POA: Diagnosis not present

## 2023-05-31 MED ORDER — FENOFIBRIC ACID 135 MG PO CPDR: 135.0000 mg | DELAYED_RELEASE_CAPSULE | Freq: Every day | ORAL | 3 refills | Status: DC

## 2023-06-03 ENCOUNTER — Telehealth: Payer: Self-pay | Admitting: *Deleted

## 2023-06-03 NOTE — Telephone Encounter (Signed)
Patient took two fenofibrate yesterday by accident, one at noon and one at 8pm. (Instead of prevacid at noon). He wanted to know if there anything to be concerned about and should he skip tonight's dose or resume?

## 2023-06-03 NOTE — Telephone Encounter (Signed)
It's fine to skip today's dose.  Shouldn't cause a problem with just one extra dose

## 2023-06-03 NOTE — Telephone Encounter (Signed)
Patient advised.

## 2023-06-16 IMAGING — CT CT RENAL STONE PROTOCOL
2 of 4 series · 16 of 46 positions shown, 18 images · non-contrast
Comparison: None.

CLINICAL DATA: 50-year-old male with right flank pain sudden onset
8688 hours. Prior kidney stones.



[Series 2: axial st · axial · 0.86mm/px · z∈[-526,-76]mm · 13 of 100 slices shown, 15 images]
[im 5/100  soft-tissue]
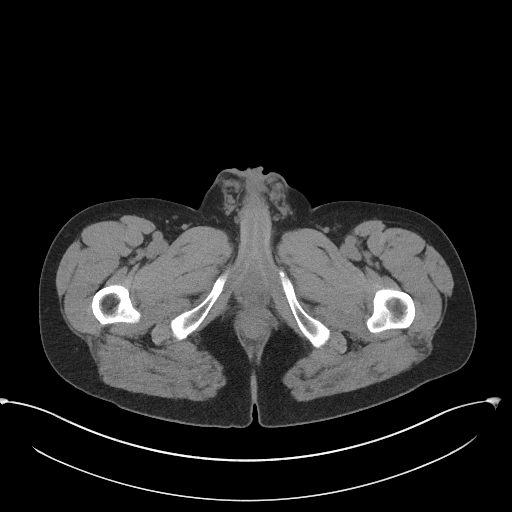
[im 5/100  bone]
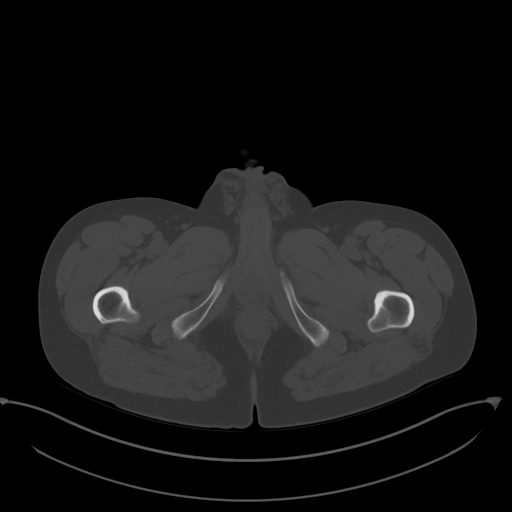
[im 13/100  soft-tissue]
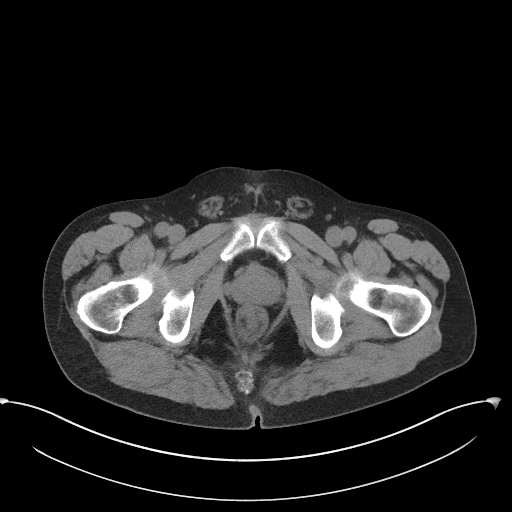
[im 22/100  soft-tissue]
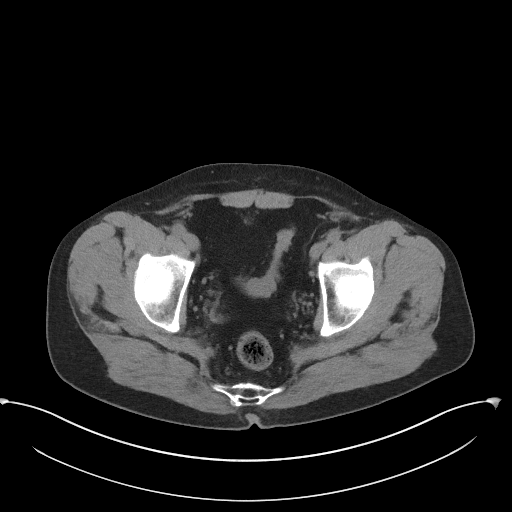
[im 26/100  soft-tissue]
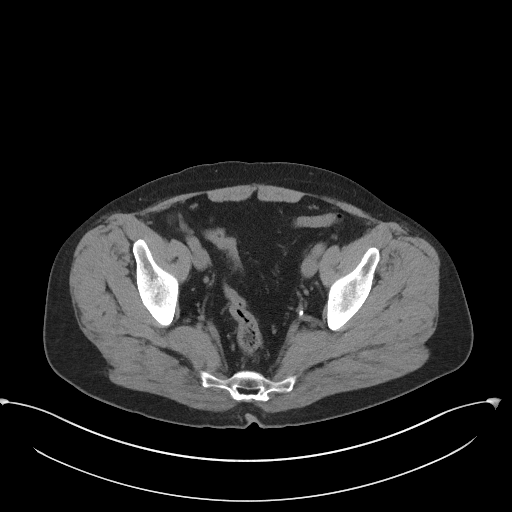
[im 35/100  soft-tissue]
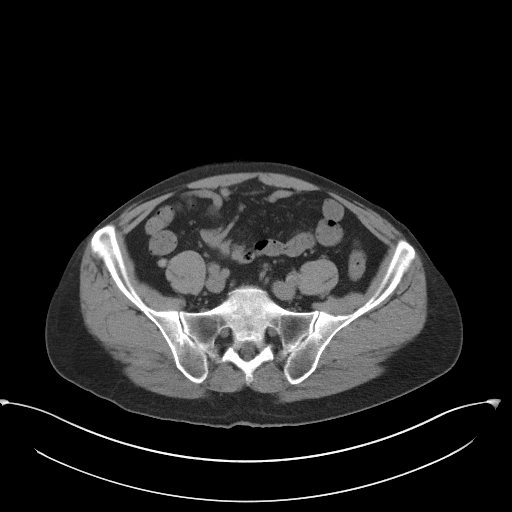
[im 44/100  soft-tissue]
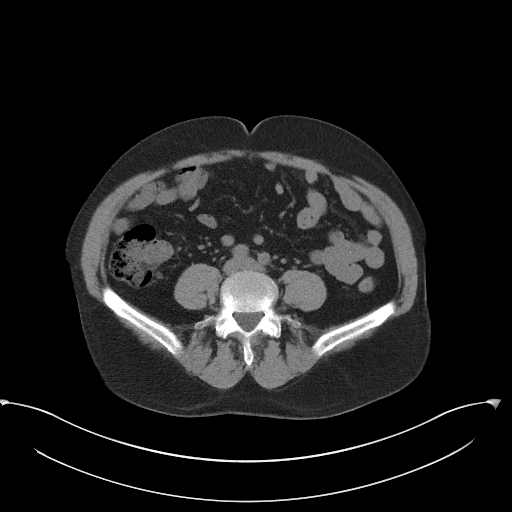
[im 52/100  soft-tissue]
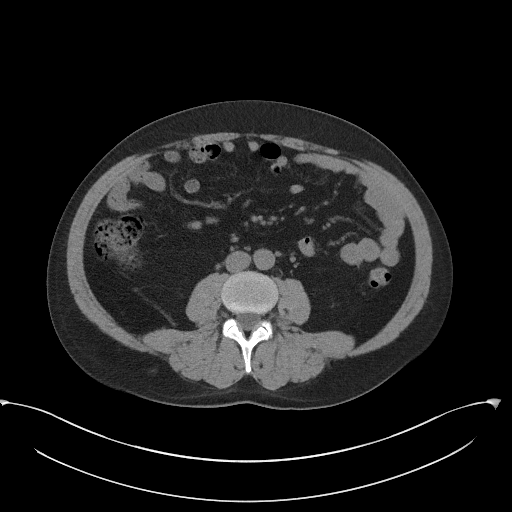
[im 56/100  soft-tissue]
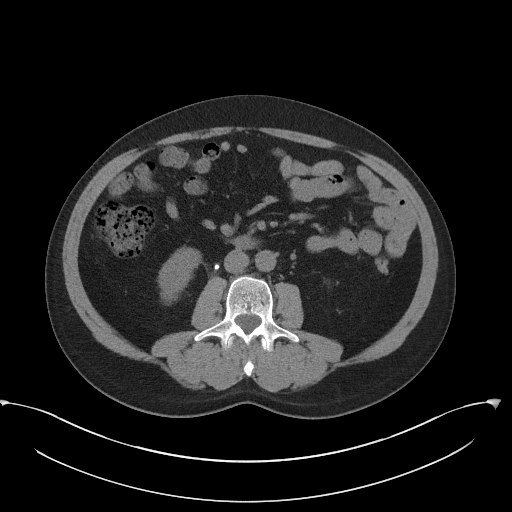
[im 65/100  soft-tissue]
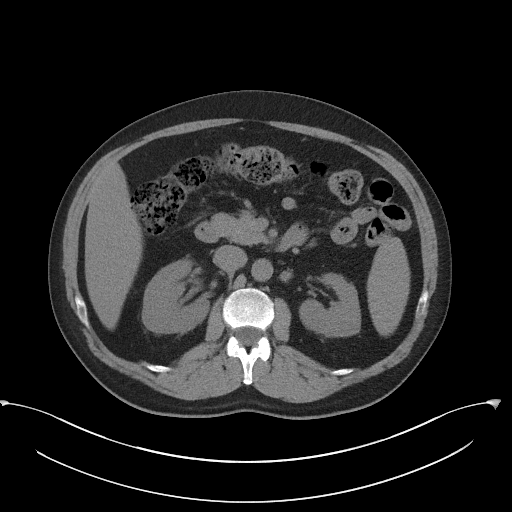
[im 65/100  bone]
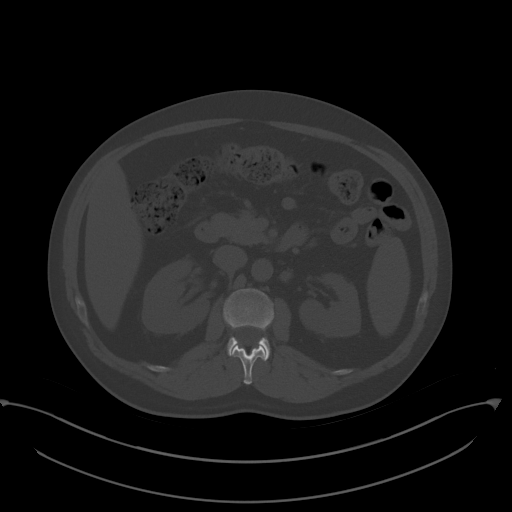
[im 74/100  soft-tissue]
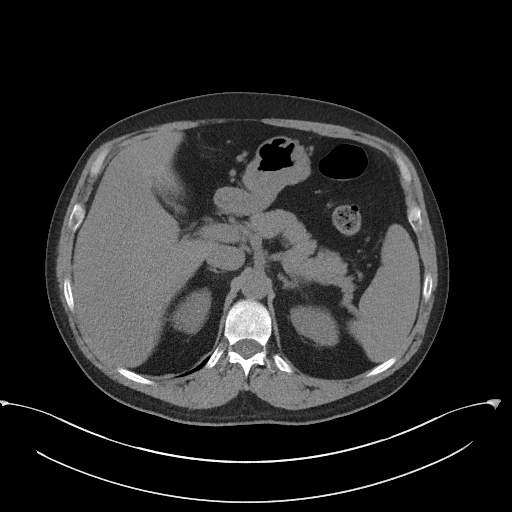
[im 78/100  soft-tissue]
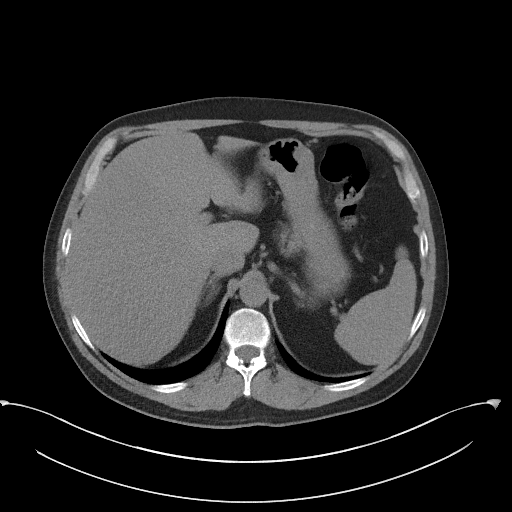
[im 87/100  soft-tissue]
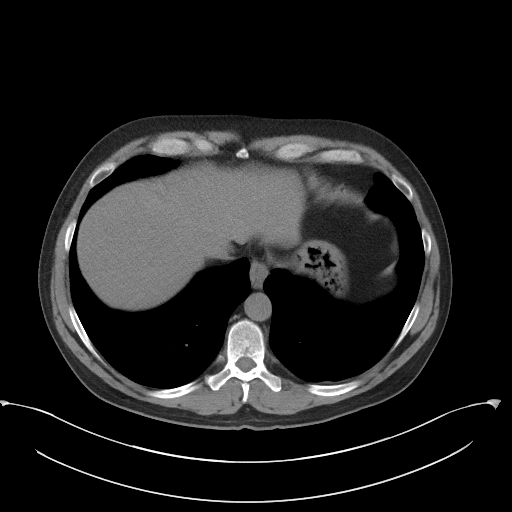
[im 95/100  soft-tissue]
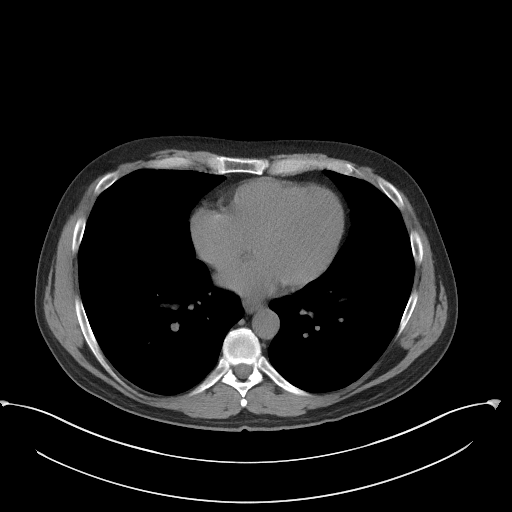

[Series 5: coronal st · coronal · 0.83mm/px · 3 of 105 slices shown]
[im 35/105  soft-tissue]
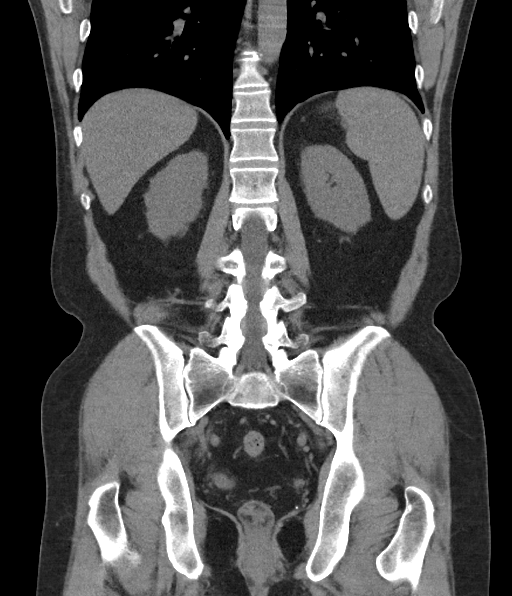
[im 47/105  soft-tissue]
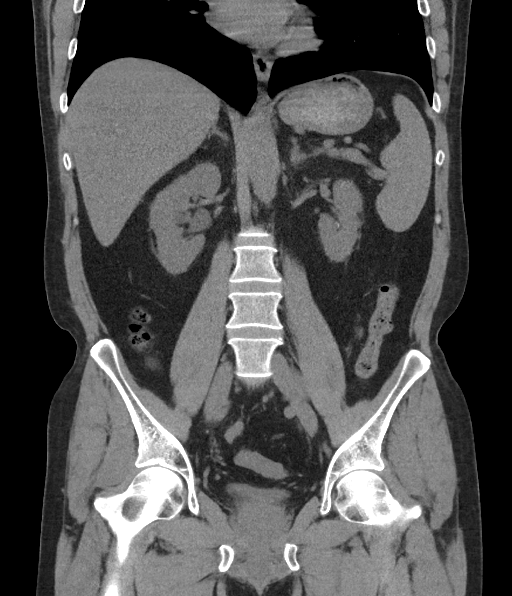
[im 58/105  soft-tissue]
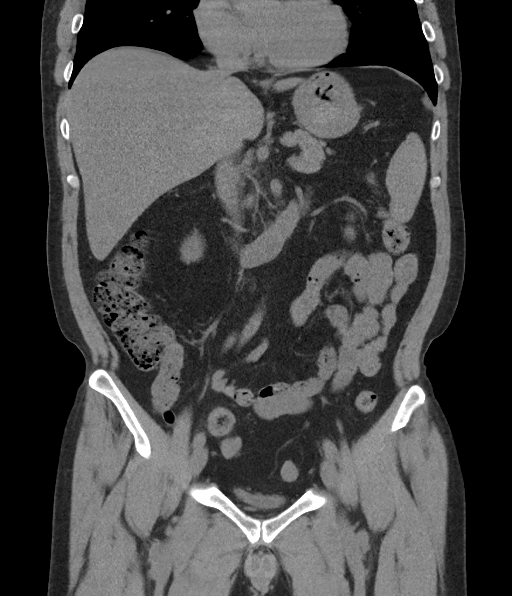

[16 of 46 positions shown; findings below may reference images not displayed]

FINDINGS: Lower chest: Negative.

Hepatobiliary: Evidence of geographic hepatic steatosis mostly in
the right lobe. Some fatty sparing at the gallbladder fossa.
Negative noncontrast gallbladder.

Pancreas: Negative noncontrast pancreas.

Spleen: Negative.

Adrenals/Urinary Tract: Normal adrenal glands. Nonobstructed left
kidney and left ureter with punctate left nephrolithiasis.

Right side hydronephrosis is mild-to-moderate with a proximal
obstructing oval right ureteral calculus measuring 5-6 mm and
located about 3 cm distal to the right ureteropelvic junction
(coronal image 52 and series 2, image 45. Regional periureteral
stranding. No additional right intrarenal calculus. Beyond the stone
the right ureter is decompressed. Bladder is decompressed.

Stomach/Bowel: No acute finding. Normal appendix on coronal image
54. No free air or free fluid.

Vascular/Lymphatic: Normal caliber abdominal aorta. Minimal
aortoiliac calcified atherosclerosis. No lymphadenopathy identified.

Reproductive: Negative.

Other: No pelvic free fluid. Numerous pelvic phleboliths.

Musculoskeletal: No acute osseous abnormality identified.
IMPRESSION: 1. Acute obstructive uropathy on the right due to a 5-6 mm proximal
ureteral calculus located about 3 cm distal to the right UPJ.
2. Punctate left nephrolithiasis.
3. Hepatic steatosis.

## 2023-07-01 ENCOUNTER — Encounter: Payer: Self-pay | Admitting: Family Medicine

## 2023-07-05 ENCOUNTER — Encounter: Payer: Self-pay | Admitting: Family Medicine

## 2023-07-05 ENCOUNTER — Ambulatory Visit: Payer: BC Managed Care – PPO | Admitting: Family Medicine

## 2023-07-05 VITALS — BP 136/88 | HR 88 | Temp 99.1°F | Ht 68.5 in | Wt 173.2 lb

## 2023-07-05 DIAGNOSIS — R058 Other specified cough: Secondary | ICD-10-CM | POA: Diagnosis not present

## 2023-07-05 MED ORDER — BENZONATATE 200 MG PO CAPS: 200.0000 mg | ORAL_CAPSULE | Freq: Three times a day (TID) | ORAL | 0 refills | Status: DC | PRN

## 2023-07-05 MED ORDER — DOXYCYCLINE HYCLATE 100 MG PO TABS: 100.0000 mg | ORAL_TABLET | Freq: Two times a day (BID) | ORAL | 0 refills | Status: DC

## 2023-07-05 NOTE — Patient Instructions (Signed)
Stay well hydrated. Continue Mucinex DM (can not take it at night if you aren't sleeping). Take doxycycline twice daily, use sunscreen if in the sun. Take the benzonatate as needed for cough. If you notice getting winded with activity (stairs, walking, etc), let us know and we can send in an inhaler Paulene Floor or albuterol)

## 2023-07-05 NOTE — Progress Notes (Signed)
Chief Complaint  Patient presents with   Cough    Still coughing a lot, low grade at night. Uncomfortable when he takes a deep breath. Marylu Lund was admitted last night to [facility] hospital for pneumonia. Stopped Mucinex DM Sat night as it was making him cough so much.    Patient got sick last week after returning from [place]. He had been seen in UC on 12/4 with negative testing for flu, COVID and RSV.  He started coughing 12/1, got worse 12/2.  Temps up to 101.  Still having LG fevers, around 99.5. Cough is productive of yellow-brownish phlegm. Also has spells of dry cough Stopped taking the Mucinex DM--wasn't helping, thinks he coughs less/sleeps better without it. Nasal drainage is also yellow (brownish-green in the morning only). Denies sinus pain. Decreased appetite.  Some tightness and coughing with a very deep breath.  His wife and daughter also got sick upon returning. His wife was admitted to the hospital last night for pneumonia.    PMH, PSH, SH reviewed  Outpatient Encounter Medications as of 07/05/2023  Medication Sig Note   Choline Fenofibrate (FENOFIBRIC ACID) 135 MG CPDR Take 135 mg by mouth daily.    doxycycline (VIBRA-TABS) 100 MG tablet Take 1 tablet (100 mg total) by mouth 2 (two) times daily.    famotidine (PEPCID) 40 MG/5ML suspension Take 10 mg by mouth at bedtime.    lansoprazole (PREVACID) 30 MG capsule Take 1 capsule (30 mg total) by mouth daily. To be taken 30 minutes before breakfast.    Multiple Vitamin (MULTIVITAMINS PO) Take by mouth daily.      OMEGA 3 1000 MG CAPS Take 1,000-2,000 mg by mouth daily.  05/31/2023: Takes 2 daily   benzonatate (TESSALON) 200 MG capsule Take 1 capsule (200 mg total) by mouth 3 (three) times daily as needed for cough.    cetirizine (ZYRTEC) 10 MG tablet Take 10 mg by mouth daily. (Patient not taking: Reported on 07/05/2023) 05/31/2023: Taking nightly 10mg , will take another if needed.   fluticasone (FLONASE) 50 MCG/ACT nasal spray  Place 2 sprays into both nostrils daily. (Patient not taking: Reported on 07/05/2023)    simethicone (MYLICON) 125 MG chewable tablet Chew 125 mg by mouth as needed. Reported on 11/26/2015 (Patient not taking: Reported on 04/21/2023) 05/31/2023: As needed   [DISCONTINUED] benzonatate (TESSALON) 200 MG capsule Take 1 capsule (200 mg total) by mouth 3 (three) times daily as needed for cough. (Patient not taking: Reported on 05/31/2023)    [DISCONTINUED] fenofibrate 160 MG tablet Take 1 tablet (160 mg total) by mouth daily.    [DISCONTINUED] ranitidine (RANITIDINE 75) 75 MG tablet Take 75 mg by mouth 2 (two) times daily.      No facility-administered encounter medications on file as of 07/05/2023.   NOT taking doxy prior to today's visit  No Known Allergies  ROS: no chills. No n/v/d, +post-tussive emesis. Denies shortness of breath. Some soreness in chest--with coughing, deep breaths (muscular). No bleeding, bruising, rash.  Just some blood from the nose occasionally. No myalgias, arthralgias, edema. Some chronic TMJ/neck pain, unchanged.    PHYSICAL EXAM:  BP 136/88   Pulse 88   Temp 99.1 F (37.3 C) (Tympanic)   Ht 5' 8.5" (1.74 m)   Wt 173 lb 3.2 oz (78.6 kg)   BMI 25.95 kg/m   Well-appearing male, sounds congestion, voice is a little hoarse (but better than when he was here with his daughter last week). HEENT: PERRL, EOMI. OP is clear, no  erythema, normal mucosa. TM's and EAC's normal. Nasal mucosa is mildly edematous with thin white mucus present.   Mildly tender at bilateral maxillary sinuses, nontender at frontal.  OP is clear without erythema. Neck: no lymphadenopathy or mass Heart: regular rate and rhythm Lungs: some ronchi/squeaks more on the L than the right, some clearing with coughing, but some is persistent. Extremities: no edema Neuro: alert and oriented, cranial nerves grossly intact, normal gait Psych: normal mood, affect, hygiene and  grooming    ASSESSMENT/PLAN:  Productive cough - suspect d/t sinus infection vs bronchitis. Sx worsening and persistent fever. Cover with ABX. f/u if sx persist/worsen - Plan: doxycycline (VIBRA-TABS) 100 MG tablet, benzonatate (TESSALON) 200 MG capsule  Stay well hydrated. Continue Mucinex DM (can not take it at night if you aren't sleeping). Take doxycycline twice daily, use sunscreen if in the sun. Take the benzonatate as needed for cough. If you notice getting winded with activity (stairs, walking, etc), let us know and we can send in an inhaler Paulene Floor or albuterol)

## 2023-09-20 ENCOUNTER — Encounter: Payer: Self-pay | Admitting: Family Medicine

## 2023-09-22 ENCOUNTER — Other Ambulatory Visit: Payer: Self-pay | Admitting: Internal Medicine

## 2023-09-23 ENCOUNTER — Telehealth: Payer: Self-pay | Admitting: *Deleted

## 2023-09-23 NOTE — Telephone Encounter (Signed)
 CDC recently stated that anybody 50 and over should have PCV 20 or 21. See message dated 2/24 to patient.  I advised him we have Prevnar-20, that some pharmacies have Capvaxive (the 21), either is fine.

## 2023-09-23 NOTE — Telephone Encounter (Signed)
 Copied from CRM [id number]. Topic: Clinical - Request for Lab/Test Order >> Sep 23, 2023  2:21 PM Ivette P wrote: Reason for CRM: Pt is wanting a Pneumonia vaccine PCV21 - and needing a procedure code to see if it is covered by the insurance.  I called [facility] and was given the billing number [phone], rep stated they do not give out procedure codes.   Pt will like a callback [phone]  This came to me. Before I send to [facility]. First, is this something he should have? Secondly, are we getting this vaccine?  Thanks.

## 2023-09-24 ENCOUNTER — Encounter: Payer: Self-pay | Admitting: *Deleted

## 2023-09-24 NOTE — Telephone Encounter (Signed)
 Patient given information

## 2023-10-18 ENCOUNTER — Ambulatory Visit: Payer: Self-pay

## 2023-10-18 NOTE — Telephone Encounter (Signed)
 Please try to call patient and find out what he is calling about. E2C2 can't reach patient

## 2023-10-18 NOTE — Telephone Encounter (Signed)
 Reason for Disposition . Third attempt to contact caller AND no contact made. Phone number verified.  Answer Assessment - Initial Assessment Questions N/A No contact, unable to triage  Protocols used: No Contact or Duplicate Contact Call-A-AH

## 2023-10-18 NOTE — Telephone Encounter (Addendum)
  Chief Complaint: No contact call  Disposition: [] ED /[] Urgent Care (no appt availability in office) / [] Appointment(In office/virtual)/ []  [facility]/ [] Home Care/ [] Refused Recommended Disposition /[] [facility]/ [x]  Follow-up with PCP Additional Notes: Forwarding to clinic for f/u, unable to reach for triage   This RN 3rd attempt to contact patient for triage. Call cannot be completed as dialed. Forwarding to clinic for f/u.   This RN second attempt to contact patient for triage. "Call cannot be completed as dialed". Placing for call back.   This RN first attempt to contact patient x2, states "call cannot be completed as dialed". Placing for call back.

## 2023-10-19 ENCOUNTER — Ambulatory Visit: Payer: Self-pay | Admitting: Medical

## 2023-10-19 VITALS — BP 124/80 | HR 82 | Wt 176.6 lb

## 2023-10-19 DIAGNOSIS — M542 Cervicalgia: Secondary | ICD-10-CM | POA: Diagnosis not present

## 2023-10-19 DIAGNOSIS — R202 Paresthesia of skin: Secondary | ICD-10-CM

## 2023-10-19 MED ORDER — TIZANIDINE HCL 4 MG PO TABS
4.0000 mg | ORAL_TABLET | Freq: Every evening | ORAL | 0 refills | Status: DC | PRN
Start: 2023-10-19 — End: 2023-12-01

## 2023-10-19 NOTE — Patient Instructions (Addendum)
 Left arm numbness, some occasional neck pain/tension  Several things can cause left arm numbness including cervical spine radicular issues such as arthritis or bulging disc, neck and upper back tension and spasm, carpal tunnel and cubital tunnel arm issues, vitamin deficiency and other.  Recommendations: Use 200mg  Motrin/Ibuprofen, use 3 tablets twice daily with food for the next week for pain and inflammation and numbness Avoid sleeping on bent arms, avoid sleeping with arms above the head, avoid propping your arms up on surfaces and be in a neutral position with your hands on the keyboard Be mindful of posture You can use tizanidine muscle relaxer at nighttime for the next couple nights to help with any neck or back tension and then just use as needed.  Caution as this can cause sleepiness Do some range of motion and gentle stretching of the neck and the arms over this next week and try to do a daily head toe stretching routine in general Consider massage therapy Avoid any hard labor or vibratory equipment over the next week If needed you can wear an arm sling periodically through the day to rest the left arm and shoulder girdle to hopefully ease some of the tingling and numbness If not much improved over the next 7 to 10 days we may want to get a baseline neck x-ray and possible referral to physical therapy

## 2023-10-19 NOTE — Progress Notes (Signed)
 Subjective:  Jordan Stafford is a 53 y.o. male who presents for Chief Complaint  Patient presents with   left arm    Left arm numbness and tingling since Thursday 6pm that will radiate to fingers. Gets better then comes back.      Here for left arm issues.  Started last week about 4 days ago.  Has been having numbness and tingling in the hand and forearm.   Sometimes feels under upper arm medially as well.   Has some tendnerss in armpit.    Father does massage therapy and worked on him last week. This helped some, but then this past weekend arm flared up again.   No new neck pain but sometimes gets tension in neck.   Gets neck pain more due to stress.    Tried some ointment but no other NSAID or ice or heat  Right handed.   Works as a Runner, broadcasting/film/video.  No other aggravating or relieving factors.    No other c/o.  Past Medical History:  Diagnosis Date   Acne    Anxiety    GERD (gastroesophageal reflux disease)    Hx of cardiovascular stress test    ETT-Myoview (08/2013):  No ischemia, EF 67%; normal study.   IBS (irritable bowel syndrome)    Renal stone    add'l stone 11/2021, identified as calcium oxalate   Seasonal allergies    Supraventricular tachycardia    Dr Graciela Husbands   Current Outpatient Medications on File Prior to Visit  Medication Sig Dispense Refill   cetirizine (ZYRTEC) 10 MG tablet Take 10 mg by mouth daily.     Choline Fenofibrate (FENOFIBRIC ACID) 135 MG CPDR Take 135 mg by mouth daily. 90 capsule 3   famotidine (PEPCID) 40 MG/5ML suspension Take 10 mg by mouth at bedtime.     fluticasone (FLONASE) 50 MCG/ACT nasal spray Place 2 sprays into both nostrils daily. 16 g 6   lansoprazole (PREVACID) 30 MG capsule TAKE 1 CAPSULE (30 MG TOTAL) BY MOUTH DAILY. TO BE TAKEN 30 MINUTES BEFORE BREAKFAST. 90 capsule 0   Multiple Vitamin (MULTIVITAMINS PO) Take by mouth daily.       OMEGA 3 1000 MG CAPS Take 1,000-2,000 mg by mouth daily.      simethicone (MYLICON) 125 MG chewable tablet  Chew 125 mg by mouth as needed. Reported on 11/26/2015     [DISCONTINUED] fenofibrate 160 MG tablet Take 1 tablet (160 mg total) by mouth daily. 90 tablet 3   [DISCONTINUED] ranitidine (RANITIDINE 75) 75 MG tablet Take 75 mg by mouth 2 (two) times daily.       No current facility-administered medications on file prior to visit.   Past Surgical History:  Procedure Laterality Date   MOUTH SURGERY     x3   TOENAIL EXCISION     ingrown nail      The following portions of the patient's history were reviewed and updated as appropriate: allergies, current medications, past family history, past medical history, past social history, past surgical history and problem list.  ROS Otherwise as in subjective above   Objective: BP 124/80   Pulse 82   Wt 176 lb 9.6 oz (80.1 kg)   BMI 26.46 kg/m   General appearance: alert, no distress, well developed, well nourished Neck: normal ROM, mild tenderness left lateral neck, otherwise no mass, no thyromegaly, no LAD Upper back nontender, no obvious deformity Arm nontender normal range of motion Mildly positive Tinel and Phalen on the left,  otherwise normal neurovascular intact Pulses: 2+ radial pulses, 2+ pedal pulses, normal cap refill Ext: no edema     Assessment: Encounter Diagnoses  Name Primary?   Arm paresthesia, left Yes   Neck pain      Plan: Left arm numbness, some occasional neck pain/tension  Several things can cause left arm numbness including cervical spine radicular issues such as arthritis or bulging disc, neck and upper back tension and spasm, carpal tunnel and cubital tunnel arm issues, vitamin deficiency and other.  Recommendations: Use 200mg  Motrin/Ibuprofen, use 3 tablets twice daily with food for the next week for pain and inflammation and numbness Avoid sleeping on bent arms, avoid sleeping with arms above the head, avoid propping your arms up on surfaces and be in a neutral position with your hands on the  keyboard Be mindful of posture You can use tizanidine muscle relaxer at nighttime for the next couple nights to help with any neck or back tension and then just use as needed.  Caution as this can cause sleepiness Do some range of motion and gentle stretching of the neck and the arms over this next week and try to do a daily head toe stretching routine in general Consider massage therapy Avoid any hard labor or vibratory equipment over the next week If needed you can wear an arm sling periodically through the day to rest the left arm and shoulder girdle to hopefully ease some of the tingling and numbness If not much improved over the next 7 to 10 days we may want to get a baseline neck x-ray and possible referral to physical therapy    Jordan "Vonna Kotyk" was seen today for left arm.  Diagnoses and all orders for this visit:  Arm paresthesia, left  Neck pain  Other orders -     tiZANidine (ZANAFLEX) 4 MG tablet; Take 1 tablet (4 mg total) by mouth at bedtime as needed for muscle spasms.   Follow up: prn

## 2023-11-29 ENCOUNTER — Other Ambulatory Visit: Payer: BC Managed Care – PPO

## 2023-11-29 DIAGNOSIS — R7989 Other specified abnormal findings of blood chemistry: Secondary | ICD-10-CM

## 2023-11-29 DIAGNOSIS — R7301 Impaired fasting glucose: Secondary | ICD-10-CM

## 2023-11-30 LAB — HEPATIC FUNCTION PANEL
ALT: 53 IU/L — ABNORMAL HIGH (ref 0–44)
AST: 38 IU/L (ref 0–40)
Albumin: 4.7 g/dL (ref 3.8–4.9)
Alkaline Phosphatase: 59 IU/L (ref 44–121)
Bilirubin Total: 0.6 mg/dL (ref 0.0–1.2)
Bilirubin, Direct: 0.21 mg/dL (ref 0.00–0.40)
Total Protein: 7.1 g/dL (ref 6.0–8.5)

## 2023-11-30 LAB — GLUCOSE, RANDOM: Glucose: 99 mg/dL (ref 70–99)

## 2023-11-30 NOTE — Progress Notes (Unsigned)
 No chief complaint on file.  Patient presents for 6 month follow-up on acute problems. See below for labs done prior to visit.  He was seen by Jimmye Moulds in late March with 4 days of numbness and tingling in the left hand and forearm, and L neck pain.  He was mildly tender at L lateral neck, and had mildly positive Tinel and Phalen on the left. He was advised to take 600 mg ibuprofen BID, prescribed tizanidine .  Advised to do stretches, consider massage, and to get XR and refer for PT if not improving.   History of elevated BP's, in office related to anxiety, sometimes related to poor diet (sodium intake).   He hasn't been checking blood pressure at home, as it makes him anxious to do so. BP's have been better in the office. Tries to follow low sodium diet.  He cut back on canned vegetables. Cut pizza to once a week, limits chinese food.     BP Readings from Last 3 Encounters:  10/19/23 124/80  07/05/23 136/88  05/31/23 136/70    Hypertriglyceridemia: He is taking fenofibrate  regularly without side effects (upsets his stomach if taken without food). He tries to follow a lowfat, low cholesterol diet.   Last lipids were at goal. Has low HDL.  Lab Results  Component Value Date   CHOL 154 05/26/2023   HDL 38 (L) 05/26/2023   LDLCALC 91 05/26/2023   TRIG 142 05/26/2023   CHOLHDL 4.1 05/26/2023    Impaired fasting glucose:  He tries to limit his sugar and sweets.  He has occasional sweets. Fasting glucose was 102 at his physical 6 months ago.  Having more pasta, and bread on sandwiches Isn't always good about his carbs or snacking at night.  ***UPDATE DIET    GERD: Under the care of Dr. Bridgett Camps.  He has been taking prevacid  30mg  before lunch, and famotidine in the evening (10 mg at bedtime and another 10 mg if he wakes up with symptoms). Denies dysphagia, wheezing. Denies heartburn.    PMH, PSH, SH reviewed   ROS: no fever, chills, URI symptoms, chest pain, shortness of breath,  palpitations. No headaches or dizziness. No nausea, vomiting, bowel changes, abdominal pain or urinary complaints.      PHYSICAL EXAM:  There were no vitals taken for this visit.  Wt Readings from Last 3 Encounters:  10/19/23 176 lb 9.6 oz (80.1 kg)  07/05/23 173 lb 3.2 oz (78.6 kg)  05/31/23 186 lb 9.6 oz (84.6 kg)    Well-appearing male, in no distress. HEENT: conjunctiva and sclera are clear, EOMI.  Sinuses nontender Neck: no lymphadenopathy or mass, no thyromegaly Heart: regular rate and rhythm, no murmur Lungs: clear bilaterally Back: no spinal or CVA tenderness Abdomen: soft, nontender, no organomegaly or mass Extremities: no edema Psych :normal mood, affect, hygiene and grooming Neuro: alert and oriented, normal strength, gait  ***tender L neck?   Fasting glu 99  Lab Results  Component Value Date   ALT 53 (H) 11/29/2023   AST 38 11/29/2023   ALKPHOS 59 11/29/2023   BILITOT 0.6 11/29/2023       ASSESSMENT/PLAN:

## 2023-12-01 ENCOUNTER — Ambulatory Visit: Payer: BC Managed Care – PPO | Admitting: Family Medicine

## 2023-12-01 ENCOUNTER — Encounter: Payer: Self-pay | Admitting: Family Medicine

## 2023-12-01 VITALS — BP 120/84 | HR 80 | Ht 68.5 in | Wt 180.8 lb

## 2023-12-01 DIAGNOSIS — Z6827 Body mass index (BMI) 27.0-27.9, adult: Secondary | ICD-10-CM

## 2023-12-01 DIAGNOSIS — R7989 Other specified abnormal findings of blood chemistry: Secondary | ICD-10-CM | POA: Diagnosis not present

## 2023-12-01 DIAGNOSIS — R7301 Impaired fasting glucose: Secondary | ICD-10-CM

## 2023-12-01 DIAGNOSIS — E781 Pure hyperglyceridemia: Secondary | ICD-10-CM | POA: Diagnosis not present

## 2023-12-01 DIAGNOSIS — Z125 Encounter for screening for malignant neoplasm of prostate: Secondary | ICD-10-CM

## 2023-12-01 DIAGNOSIS — Z0000 Encounter for general adult medical examination without abnormal findings: Secondary | ICD-10-CM

## 2023-12-01 NOTE — Patient Instructions (Addendum)
 Try and limit snacking after meals. Be sure to drink enough water with your meal (sometimes your brain can't tell the difference between hunger and thirst). Consider a walk after dinner. Try and get far away from the kitchen/pantry (go upstairs in the evenings).  Do the best you can to try and make healthy food choices, and lose some of the weight from your stomach.  Check to see if you need a follow-up with Dr. Bridgett Camps in order to get your refills (there was a comment stating that in the chart, but if you said he only needs to see you every 2 years, then clarify that with their office before you run out).

## 2023-12-14 ENCOUNTER — Telehealth: Payer: Self-pay | Admitting: Internal Medicine

## 2023-12-14 MED ORDER — LANSOPRAZOLE 30 MG PO CPDR: 30.0000 mg | DELAYED_RELEASE_CAPSULE | Freq: Every day | ORAL | 0 refills | Status: DC

## 2023-12-14 NOTE — Telephone Encounter (Signed)
 Patient last seen 08-2022 with Dr. Bridgett Camps.  Will send 90 day supply but patient needs an appointment for any further refills.

## 2023-12-14 NOTE — Telephone Encounter (Signed)
 Inbound call from patient, would like refill of lanzoprazole called in to [facility] in [employer] on [address].

## 2024-03-09 ENCOUNTER — Other Ambulatory Visit: Payer: Self-pay | Admitting: Internal Medicine

## 2024-03-22 ENCOUNTER — Encounter: Payer: Self-pay | Admitting: Internal Medicine

## 2024-03-22 ENCOUNTER — Ambulatory Visit: Payer: Self-pay | Admitting: Internal Medicine

## 2024-03-22 VITALS — BP 122/80 | HR 86 | Ht 69.0 in | Wt 173.0 lb

## 2024-03-22 DIAGNOSIS — Z860100 Personal history of colon polyps, unspecified: Secondary | ICD-10-CM | POA: Diagnosis not present

## 2024-03-22 DIAGNOSIS — K760 Fatty (change of) liver, not elsewhere classified: Secondary | ICD-10-CM | POA: Diagnosis not present

## 2024-03-22 DIAGNOSIS — K219 Gastro-esophageal reflux disease without esophagitis: Secondary | ICD-10-CM | POA: Diagnosis not present

## 2024-03-22 DIAGNOSIS — R748 Abnormal levels of other serum enzymes: Secondary | ICD-10-CM | POA: Diagnosis not present

## 2024-03-22 MED ORDER — LANSOPRAZOLE 30 MG PO CPDR: DELAYED_RELEASE_CAPSULE | ORAL | 3 refills | Status: AC

## 2024-03-22 NOTE — Patient Instructions (Signed)
 We have sent the following medications to your pharmacy for you to pick up at your convenience: lansoprazole .   Continue pepcid at bedtime.   Please purchase the following medications over the counter and take as directed: Vitamin E 800 units daily.   _______________________________________________________  If your blood pressure at your visit was 140/90 or greater, please contact your primary care physician to follow up on this.  _______________________________________________________  If you are age 54 or older, your body mass index should be between 23-30. Your Body mass index is 25.55 kg/m. If this is out of the aforementioned range listed, please consider follow up with your Primary Care Provider.  If you are age 54 or younger, your body mass index should be between 19-25. Your Body mass index is 25.55 kg/m. If this is out of the aformentioned range listed, please consider follow up with your Primary Care Provider.   ________________________________________________________  The [facility] providers would like to encourage you to use MYCHART to communicate with providers for non-urgent requests or questions.  Due to long hold times on the telephone, sending your provider a message by [facility] may be a faster and more efficient way to get a response.  Please allow 48 business hours for a response.  Please remember that this is for non-urgent requests.  _______________________________________________________  [facility] is using a team-based approach to care.  Your team is made up of your doctor and two to three APPS. Our APPS (Nurse Practitioners and Physician Assistants) work with your physician to ensure care continuity for you. They are fully qualified to address your health concerns and develop a treatment plan. They communicate directly with your gastroenterologist to care for you. Seeing the Advanced Practice Practitioners on your physician's team can help you by  facilitating care more promptly, often allowing for earlier appointments, access to diagnostic testing, procedures, and other specialty referrals.

## 2024-03-22 NOTE — Progress Notes (Signed)
   Subjective:    Patient ID: Jordan Stafford, male    DOB: 1970/11/17, 53 y.o.   MRN: [id number]  HPI Halvor Behrend is a 52 year old male with GERD, hx of nonadvanced colon polyps, and recently elevated liver enzymes who presents for follow-up.  His gastroesophageal reflux disease (GERD) symptoms are well-controlled with current medications. He takes Lansoprazole  30 mg in the morning and Pepcid 10 mg at night, increasing to 20 mg if he anticipates dietary triggers such as pizza. No issues with swallowing, food getting stuck, abdominal pain, nausea, vomiting, or changes in bowel habits.  He has a history of elevated liver enzymes over the past 6-12 months, with recent labs showing AST 38, ALT 53, and alkaline phosphatase 59. Previous labs also indicated elevated ALT and AST levels. A past CT scan (2023), initially performed for a kidney stone, revealed areas of hepatic steatosis. He denies alcohol consumption and notes that he is overweight but not significantly so.  He does not have diabetes, and his blood pressure is reported as normal.  He does not drink alcohol.  He is increasing physical activity by walking more with his puppy.  No family history of liver disease  Review of Systems As per HPI, otherwise negative  Current Medications, Allergies, Past Medical History, Past Surgical History, Family History and Social History were reviewed in [employer] record.    Objective:   Physical Exam BP 122/80   Pulse 86   Ht 5' 9 (1.753 m)   Wt 173 lb (78.5 kg)   BMI 25.55 kg/m  Gen: awake, alert, NAD HEENT: anicteric  Neuro: nonfocal   LABS AST: 38 (11/29/2023) ALT: 53 (11/29/2023) Alkaline Phosphatase: 59 (11/29/2023) Total Bilirubin: 0.6 (11/29/2023) Albumin: 4.7 (11/29/2023) BUN: 18 (05/26/2023) Creatinine: 1.22 (05/26/2023) Hemoglobin: 14.4 (05/26/2023) MCV: 93 (05/26/2023) Platelet Count: 227 (05/26/2023) White Blood Cell Count: 5.2  (05/26/2023)  RADIOLOGY Abdominal CT: Hepatic steatosis mostly in the right lobe of the liver (11/23/2021)     Assessment & Plan:  53 year old male with GERD, hx of nonadvanced colon polyps, and recently elevated liver enzymes who presents for follow-up.  Gastroesophageal reflux disease (GERD) GERD symptoms controlled with Lansoprazole  and Pepcid. No swallowing issues or need for repeat endoscopy.  No history of Barrett's esophagus - Continue Lansoprazole  30 mg in the morning. - Continue Pepcid 10 mg at night, increase to 20 mg if needed after certain meals.  Elevated liver enzymes/fatty liver by CT from 2023  Mildly elevated AST and ALT suggest possible MASLD. CT scan showed hepatic steatosis from 2023. Discussed metabolic factors associated with fatty liver disease.  We also discussed the risk in some patients of fibrosis over time. - Start Vitamin E 800 IU daily. - Repeat liver function tests in November with primary care. - Consider liver ultrasound if liver enzymes remain elevated. - Plan for FibroScan assessment when available at our practice. - If liver enzymes remain elevated it would be prudent to exclude other causes of liver inflammation such as autoimmune causes, iron overload, copper storage disease, viral hepatitis, etc.  History of nonadvanced adenoma of the colon - Recall colonoscopy February 2029  1 year follow-up unless if needed sooner for elevated liver enzymes  30 minutes total spent today including patient facing time, coordination of care, reviewing medical history/procedures/pertinent radiology studies, and documentation of the encounter.

## 2024-03-23 ENCOUNTER — Encounter: Payer: Self-pay | Admitting: Internal Medicine

## 2024-05-01 ENCOUNTER — Ambulatory Visit

## 2024-05-01 DIAGNOSIS — Z23 Encounter for immunization: Secondary | ICD-10-CM | POA: Diagnosis not present

## 2024-06-05 ENCOUNTER — Other Ambulatory Visit: Payer: Self-pay

## 2024-06-05 DIAGNOSIS — Z125 Encounter for screening for malignant neoplasm of prostate: Secondary | ICD-10-CM

## 2024-06-05 DIAGNOSIS — R7989 Other specified abnormal findings of blood chemistry: Secondary | ICD-10-CM

## 2024-06-05 DIAGNOSIS — Z0000 Encounter for general adult medical examination without abnormal findings: Secondary | ICD-10-CM

## 2024-06-05 DIAGNOSIS — R7301 Impaired fasting glucose: Secondary | ICD-10-CM

## 2024-06-05 DIAGNOSIS — E781 Pure hyperglyceridemia: Secondary | ICD-10-CM

## 2024-06-06 LAB — CBC WITH DIFFERENTIAL/PLATELET
Basophils Absolute: 0 x10E3/uL (ref 0.0–0.2)
Basos: 0 %
EOS (ABSOLUTE): 0.1 x10E3/uL (ref 0.0–0.4)
Eos: 3 %
Hematocrit: 41.3 % (ref 37.5–51.0)
Hemoglobin: 13.8 g/dL (ref 13.0–17.7)
Immature Grans (Abs): 0 x10E3/uL (ref 0.0–0.1)
Immature Granulocytes: 0 %
Lymphocytes Absolute: 1.6 x10E3/uL (ref 0.7–3.1)
Lymphs: 30 %
MCH: 30.7 pg (ref 26.6–33.0)
MCHC: 33.4 g/dL (ref 31.5–35.7)
MCV: 92 fL (ref 79–97)
Monocytes Absolute: 0.5 x10E3/uL (ref 0.1–0.9)
Monocytes: 10 %
Neutrophils Absolute: 3 x10E3/uL (ref 1.4–7.0)
Neutrophils: 57 %
Platelets: 215 x10E3/uL (ref 150–450)
RBC: 4.49 x10E6/uL (ref 4.14–5.80)
RDW: 12.2 % (ref 11.6–15.4)
WBC: 5.2 x10E3/uL (ref 3.4–10.8)

## 2024-06-06 LAB — COMPREHENSIVE METABOLIC PANEL WITH GFR
ALT: 20 IU/L (ref 0–44)
AST: 22 IU/L (ref 0–40)
Albumin: 4.7 g/dL (ref 3.8–4.9)
Alkaline Phosphatase: 65 IU/L (ref 47–123)
BUN/Creatinine Ratio: 16 (ref 9–20)
BUN: 17 mg/dL (ref 6–24)
Bilirubin Total: 0.5 mg/dL (ref 0.0–1.2)
CO2: 23 mmol/L (ref 20–29)
Calcium: 9.6 mg/dL (ref 8.7–10.2)
Chloride: 101 mmol/L (ref 96–106)
Creatinine, Ser: 1.08 mg/dL (ref 0.76–1.27)
Globulin, Total: 2.2 g/dL (ref 1.5–4.5)
Glucose: 104 mg/dL — ABNORMAL HIGH (ref 70–99)
Potassium: 4.1 mmol/L (ref 3.5–5.2)
Sodium: 139 mmol/L (ref 134–144)
Total Protein: 6.9 g/dL (ref 6.0–8.5)
eGFR: 82 mL/min/1.73 (ref >59)

## 2024-06-06 LAB — LIPID PANEL
Chol/HDL Ratio: 3.3 ratio (ref 0.0–5.0)
Cholesterol, Total: 137 mg/dL (ref 100–199)
HDL: 41 mg/dL (ref >39)
LDL Chol Calc (NIH): 80 mg/dL (ref 0–99)
Triglycerides: 85 mg/dL (ref 0–149)
VLDL Cholesterol Cal: 16 mg/dL (ref 5–40)

## 2024-06-06 LAB — HEMOGLOBIN A1C
Est. average glucose Bld gHb Est-mCnc: 105 mg/dL
Hgb A1c MFr Bld: 5.3 % (ref 4.8–5.6)

## 2024-06-06 LAB — PSA: Prostate Specific Ag, Serum: 0.3 ng/mL (ref 0.0–4.0)

## 2024-06-06 NOTE — Patient Instructions (Signed)

## 2024-06-06 NOTE — Progress Notes (Unsigned)
 No chief complaint on file.  Jordan Stafford is a 53 y.o. male who presents for a complete physical.  He had labs done prior to his visit, see below.   History of elevated BP's, in office related to anxiety, sometimes related to poor diet (sodium intake).   BP's have been better in the office. Tries to follow low sodium diet.  He cut back on canned vegetables. Cut pizza to once a week (some pepperoni), limits chinese food.   He cut back on fast food.  No longer eats lunch meat.   BP Readings from Last 3 Encounters:  03/22/24 122/80  12/01/23 120/84  10/19/23 124/80   Hypertriglyceridemia: He is taking fenofibrate  regularly without side effects (upsets his stomach if taken without food). He tries to follow a lowfat, low cholesterol diet.     Impaired fasting glucose:  He tries to limit his sugar and sweets.  He has occasional sweets. Fasting glucose was 104 on recent labs, with normal A1c (see below). He reports liking food like a 53 year old--not a sophisticated palate.  He tries to limit sodium, sometimes has more carbs. He reported in May that he eats constantly from after finishing dinner until 8pm.  He still craves food, even after dinner. We had discussed trying to limit snacking after meals, consider a walk after dinner, ensuring he was drinking enough water with meal/during day in general. Discussed getting away from the kitchen/pantry (go upstairs in the evenings).  ***UPDATE   GERD: Under the care of Dr. Albertus.  He has been taking prevacid  30mg  before lunch, and famotidine in the evening (10 mg at bedtime and another 10 mg if he wakes up with symptoms). He was last seen in August 2025, and no changes were made. Denies dysphagia, wheezing. Denies heartburn.  Elevated LFT's:  This was also discussed with Dr. Albertus in August.  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  CT in 2023 showed hepatic steatosis. He recommended starting vitamin D  800 IU daily, and to recheck LFT's as planned with  CPE.  These LFT's are now normal. They had discussed additional eval if LFT's remained elevated, and to consider FibroScan in future when available at their practice.    H/o SVT: He has been off diltiazem  for years (2013), with no recurrences of SVT.  Had normal stress test 08/2013.  He has had no further episodes of SVT since 2008. Released from care of cardiologist in 01/2014.     Immunization History  Administered Date(s) Administered   Influenza Split 05/27/2011   Influenza, Seasonal, Injecte, Preservative Fre 04/21/2023, 05/01/2024   Influenza,inj,Quad PF,6+ Mos 04/11/2014, 04/15/2015, 04/15/2016, 04/12/2017, 03/29/2018, 04/10/2019, 05/09/2020, 05/15/2021, 04/16/2022   PFIZER(Purple Top)SARS-COV-2 Vaccination 08/03/2019, 08/24/2019, 04/25/2020, 12/09/2020   Pfizer Covid-19 Vaccine Bivalent Booster 11yrs & up 05/15/2021   Pfizer(Comirnaty)Fall Seasonal Vaccine 12 years and older 05/22/2022, 04/21/2023, 05/01/2024   Pneumococcal Conjugate Pcv21, Polysaccharide Crm197 Conjugaf 09/24/2023   Tdap 07/25/2008, 04/14/2018   Varicella 05/18/2017, 06/22/2017   Last colonoscopy: 08/2019 with Dr. Albertus.  2 polyps (tubular adenomas), small internal hemorrhoids.  F/u 7 years recommended   Last PSA:  Lab Results  Component Value Date   PSA1 0.3 06/05/2024   PSA1 0.4 05/26/2023   PSA1 0.5 05/22/2022  Dentist: twice yearly   Ophtho: once yearly   Exercise:    Walks a lot at work (walks laps in the building frequently), about 20 minutes 3-5 days/week. Picks up 12# puppy and carrying her often. Walks the dog for 10-15 minutes  daily.   PMH, PSH, SH and FH were reviewed and updated    ROS: The patient denies anorexia, fever, vision loss, decreased hearing, ear pain, hoarseness, chest pain, dizziness, syncope, dyspnea on exertion, cough, swelling, nausea, vomiting, constipation, abdominal pain, melena, hematochezia, hematuria, incontinence, erectile dysfunction, nocturia, weakened urine stream,  dysuria, genital lesions, joint pains, numbness, tingling, weakness, tremor, depression, abnormal bleeding/bruising, or enlarged lymph nodes    GERD is controlled, per PPI, no dysphagia. Tonsillar stones intermittently, easily to remove with his tongue. Some tension headaches Only occasional snoring.  Only unrefreshed in the mornings if he had to get up related to the dogs.  L hand tingling or neck pain? Weight loss   PHYSICAL EXAM:  There were no vitals taken for this visit.  Wt Readings from Last 3 Encounters:  03/22/24 173 lb (78.5 kg)  12/01/23 180 lb 12.8 oz (82 kg)  10/19/23 176 lb 9.6 oz (80.1 kg)  05/2023 186 lb 9.6 oz (84.6 kg)   05/2022  177 lb 6.4 oz  General Appearance:   Alert, cooperative, no distress, appears stated age, in good spirits.  Head:   Normocephalic, without obvious abnormality, atraumatic    Eyes:   PERRL, conjunctiva/corneas clear, EOM's intact, fundi benign    Ears:   Normal TM's and external ear canals    Nose:   No drainage or sinus tenderness.  Throat:   Normal mucosa  Neck:   Supple, no lymphadenopathy; thyroid: no enlargement/tenderness/ nodules; no carotid bruit or JVD    Back:   Spine nontender, no curvature, ROM normal, no CVA tenderness    Lungs:   Clear to auscultation bilaterally without wheezes, rales or ronchi; respirations unlabored    Chest Wall:   No tenderness or deformity    Heart:   Regular rate and rhythm, S1 and S2 normal, no murmur, rub or gallop    Breast Exam:  No chest wall tenderness, masses or gynecomastia    Abdomen:   Soft, non-tender, nondistended, normoactive bowel sounds, no masses, no hepatosplenomegaly    Genitalia:   Normal male external genitalia without lesions. Testicles without masses. No inguinal hernias.    Rectal:   Normal sphincter tone, no masses or tenderness; heme negative stool. Prostate smooth, no nodules, not enlarged.    Extremities:   No clubbing, cyanosis or edema.   Pulses:   2+ and symmetric all  extremities    Skin:   Skin color, texture, turgor normal.   Lymph nodes:   Cervical, supraclavicular, and inguinal nodes normal    Neurologic:   Normal strength, sensation and gait; reflexes 2+ and symmetric throughout                 Psych:  Normal mood, hygiene and grooming. Full range of affect    Lab Results  Component Value Date   CHOL 137 06/05/2024   HDL 41 06/05/2024   LDLCALC 80 06/05/2024   TRIG 85 06/05/2024   CHOLHDL 3.3 06/05/2024     Chemistry      Component Value Date/Time   NA 139 06/05/2024 0836   K 4.1 06/05/2024 0836   CL 101 06/05/2024 0836   CO2 23 06/05/2024 0836   BUN 17 06/05/2024 0836   CREATININE 1.08 06/05/2024 0836   CREATININE 1.07 04/22/2017 0754      Component Value Date/Time   CALCIUM 9.6 06/05/2024 0836   ALKPHOS 65 06/05/2024 0836   AST 22 06/05/2024 0836   ALT 20 06/05/2024 0836  BILITOT 0.5 06/05/2024 0836     Fasting glucose 104  Lab Results  Component Value Date   HGBA1C 5.3 06/05/2024   Lab Results  Component Value Date   WBC 5.2 06/05/2024   HGB 13.8 06/05/2024   HCT 41.3 06/05/2024   MCV 92 06/05/2024   PLT 215 06/05/2024   Lab Results  Component Value Date   PSA1 0.3 06/05/2024   PSA1 0.4 05/26/2023   PSA1 0.5 05/22/2022     ASSESSMENT/PLAN:     Recommended at least 30 minutes of aerobic activity at least 5 days/week, weight-bearing exercise 2-3x/wk; proper sunscreen use reviewed; healthy diet and alcohol recommendations (less than or equal to 2 drinks/day) reviewed; regular seatbelt use; changing batteries in smoke detectors. Immunization recommendations discussed--UTD. Continue yearly flu shots. Discussed that he is low risk for Shingrix (He is at lower risk than someone who had chicken pox, but not zero); consider Shingrix at age 84.  He had varicella vaccines in 2018  Colonoscopy recommendations reviewed--UTD, 7 year f/u recommended, 08/2026.   1 yr vs 6 mo f/u???

## 2024-06-07 ENCOUNTER — Ambulatory Visit: Payer: Self-pay | Admitting: Family Medicine

## 2024-06-07 ENCOUNTER — Encounter: Payer: Self-pay | Admitting: Family Medicine

## 2024-06-07 VITALS — BP 130/84 | HR 80 | Ht 69.0 in | Wt 172.4 lb

## 2024-06-07 DIAGNOSIS — Z0000 Encounter for general adult medical examination without abnormal findings: Secondary | ICD-10-CM

## 2024-06-07 DIAGNOSIS — J302 Other seasonal allergic rhinitis: Secondary | ICD-10-CM | POA: Diagnosis not present

## 2024-06-07 DIAGNOSIS — K760 Fatty (change of) liver, not elsewhere classified: Secondary | ICD-10-CM

## 2024-06-07 DIAGNOSIS — E781 Pure hyperglyceridemia: Secondary | ICD-10-CM | POA: Diagnosis not present

## 2024-06-07 DIAGNOSIS — R7301 Impaired fasting glucose: Secondary | ICD-10-CM

## 2024-06-07 DIAGNOSIS — K219 Gastro-esophageal reflux disease without esophagitis: Secondary | ICD-10-CM

## 2024-06-07 DIAGNOSIS — Z23 Encounter for immunization: Secondary | ICD-10-CM

## 2024-06-07 MED ORDER — FLUTICASONE PROPIONATE 50 MCG/ACT NA SUSP: 2.0000 | Freq: Every day | NASAL | 6 refills | Status: DC

## 2024-06-07 MED ORDER — FENOFIBRIC ACID 135 MG PO CPDR: 135.0000 mg | DELAYED_RELEASE_CAPSULE | Freq: Every day | ORAL | 3 refills | Status: AC

## 2024-06-22 ENCOUNTER — Other Ambulatory Visit: Payer: Self-pay | Admitting: Family Medicine

## 2024-06-22 DIAGNOSIS — J302 Other seasonal allergic rhinitis: Secondary | ICD-10-CM

## 2024-07-10 ENCOUNTER — Other Ambulatory Visit

## 2024-07-11 ENCOUNTER — Other Ambulatory Visit

## 2024-07-11 DIAGNOSIS — Z23 Encounter for immunization: Secondary | ICD-10-CM | POA: Diagnosis not present

## 2024-07-11 NOTE — Progress Notes (Unsigned)
 Patient is in office today for a nurse visit for Immunization. Patient Injection was given in the  Right deltoid. Patient tolerated injection well.

## 2024-12-11 ENCOUNTER — Other Ambulatory Visit

## 2025-06-11 ENCOUNTER — Encounter: Admitting: Family Medicine
